# Patient Record
Sex: Female | Born: 1950
Health system: Southern US, Community
[De-identification: ages and names within clinical notes are randomized; demographics above are authoritative.]

## PROBLEM LIST (undated history)

## (undated) DIAGNOSIS — K219 Gastro-esophageal reflux disease without esophagitis: Secondary | ICD-10-CM

## (undated) DIAGNOSIS — N951 Menopausal and female climacteric states: Secondary | ICD-10-CM

## (undated) DIAGNOSIS — E039 Hypothyroidism, unspecified: Secondary | ICD-10-CM

## (undated) DIAGNOSIS — R011 Cardiac murmur, unspecified: Secondary | ICD-10-CM

## (undated) DIAGNOSIS — I341 Nonrheumatic mitral (valve) prolapse: Secondary | ICD-10-CM

## (undated) DIAGNOSIS — K589 Irritable bowel syndrome without diarrhea: Secondary | ICD-10-CM

## (undated) DIAGNOSIS — M1990 Unspecified osteoarthritis, unspecified site: Secondary | ICD-10-CM

## (undated) DIAGNOSIS — E669 Obesity, unspecified: Secondary | ICD-10-CM

## (undated) DIAGNOSIS — E785 Hyperlipidemia, unspecified: Secondary | ICD-10-CM

## (undated) DIAGNOSIS — I2510 Atherosclerotic heart disease of native coronary artery without angina pectoris: Secondary | ICD-10-CM

## (undated) DIAGNOSIS — I10 Essential (primary) hypertension: Secondary | ICD-10-CM

## (undated) HISTORY — PX: COSMETIC SURGERY: SHX468

## (undated) HISTORY — DX: Irritable bowel syndrome, unspecified: K58.9

## (undated) HISTORY — DX: Obesity, unspecified: E66.9

## (undated) HISTORY — PX: BREAST EXCISIONAL BIOPSY: SUR124

## (undated) HISTORY — DX: Nonrheumatic mitral (valve) prolapse: I34.1

## (undated) HISTORY — PX: BREAST SURGERY: SHX581

## (undated) HISTORY — DX: Cardiac murmur, unspecified: R01.1

## (undated) HISTORY — DX: Gastro-esophageal reflux disease without esophagitis: K21.9

## (undated) HISTORY — DX: Essential (primary) hypertension: I10

## (undated) HISTORY — DX: Hypothyroidism, unspecified: E03.9

## (undated) HISTORY — PX: EYE SURGERY: SHX253

## (undated) HISTORY — DX: Menopausal and female climacteric states: N95.1

## (undated) HISTORY — PX: OTHER SURGICAL HISTORY: SHX169

## (undated) HISTORY — DX: Hyperlipidemia, unspecified: E78.5

## (undated) HISTORY — DX: Unspecified osteoarthritis, unspecified site: M19.90

---

## 1998-06-16 LAB — FECAL OCCULT BLOOD, GUAIAC: Fecal Occult Blood: NEGATIVE

## 2001-01-15 LAB — HM DEXA SCAN: HM Dexa Scan: NORMAL

## 2003-08-16 LAB — CONVERTED CEMR LAB: Pap Smear: NORMAL

## 2003-10-26 ENCOUNTER — Ambulatory Visit: Payer: Self-pay | Admitting: Gastroenterology

## 2003-10-26 LAB — HM COLONOSCOPY: HM Colonoscopy: NORMAL

## 2004-11-20 ENCOUNTER — Ambulatory Visit: Payer: Self-pay | Admitting: Family Medicine

## 2006-02-04 ENCOUNTER — Ambulatory Visit: Payer: Self-pay | Admitting: Family Medicine

## 2007-11-18 ENCOUNTER — Encounter: Payer: Self-pay | Admitting: Family Medicine

## 2009-01-31 ENCOUNTER — Encounter: Payer: Self-pay | Admitting: Family Medicine

## 2009-05-16 ENCOUNTER — Ambulatory Visit: Payer: Self-pay | Admitting: Family Medicine

## 2009-05-16 ENCOUNTER — Other Ambulatory Visit: Admission: RE | Admit: 2009-05-16 | Discharge: 2009-05-16 | Payer: Self-pay | Admitting: Family Medicine

## 2009-05-16 DIAGNOSIS — E039 Hypothyroidism, unspecified: Secondary | ICD-10-CM | POA: Insufficient documentation

## 2009-05-16 DIAGNOSIS — E785 Hyperlipidemia, unspecified: Secondary | ICD-10-CM | POA: Insufficient documentation

## 2009-05-16 DIAGNOSIS — N8112 Cystocele, lateral: Secondary | ICD-10-CM | POA: Insufficient documentation

## 2009-05-16 LAB — HM PAP SMEAR

## 2009-05-23 ENCOUNTER — Encounter: Admission: RE | Admit: 2009-05-23 | Discharge: 2009-05-23 | Payer: Self-pay | Admitting: Family Medicine

## 2009-05-23 LAB — HM MAMMOGRAPHY: HM Mammogram: NEGATIVE

## 2009-05-25 ENCOUNTER — Encounter: Payer: Self-pay | Admitting: Family Medicine

## 2009-05-25 ENCOUNTER — Encounter (INDEPENDENT_AMBULATORY_CARE_PROVIDER_SITE_OTHER): Payer: Self-pay | Admitting: *Deleted

## 2009-05-25 LAB — CONVERTED CEMR LAB: Pap Smear: NEGATIVE

## 2009-11-21 ENCOUNTER — Ambulatory Visit: Payer: Self-pay | Admitting: Family Medicine

## 2009-11-22 LAB — CONVERTED CEMR LAB
ALT: 17 units/L (ref 0–35)
AST: 18 units/L (ref 0–37)
Cholesterol: 169 mg/dL (ref 0–200)
HDL: 48.5 mg/dL (ref >39.00)
LDL Cholesterol: 95 mg/dL (ref 0–99)
TSH: 3.07 microintl units/mL (ref 0.35–5.50)
Total CHOL/HDL Ratio: 3
Triglycerides: 126 mg/dL (ref 0.0–149.0)
VLDL: 25.2 mg/dL (ref 0.0–40.0)

## 2010-02-14 NOTE — Letter (Signed)
Summary: Results Follow up Letter  [place] [facility]  [address] [place], [place] [address]   Phone: [phone]  Fax: [phone]    05/25/2009 MRN: [id number]    Robin Gilmore [address] [place], [place]  [address]    Dear Ms. [facility],  The following are the results of your recent test(s):  Test         Result    Pap Smear:        Normal _____  Not Normal _____ Comments: ______________________________________________________ Cholesterol: LDL(Bad cholesterol):         Your goal is less than:         HDL (Good cholesterol):       Your goal is more than: Comments:  ______________________________________________________ Mammogram:        Normal ___X__  Not Normal _____ Comments:  Yearly follow up is recommended.   ___________________________________________________________________ Hemoccult:        Normal _____  Not normal _______ Comments:    _____________________________________________________________________ Other Tests:    We routinely do not discuss normal results over the telephone.  If you desire a copy of the results, or you have any questions about this information we can discuss them at your next office visit.   Sincerely,    Marne A. Milinda Antis, M.D.  MAT:lsf

## 2010-02-14 NOTE — Letter (Signed)
Summary: Dr.Charles Phillilps Records  Dr.Charles Phillilps Records   Imported By: Beau Fanny 05/19/2009 09:36:21  _____________________________________________________________________  External Attachment:    Type:   Image     Comment:   External Document

## 2010-02-14 NOTE — Assessment & Plan Note (Signed)
Summary: establish from billie/pap/bladder/dlo   Vital Signs:  Patient profile:   59 year old female Height:      67 inches Weight:      200.50 pounds BMI:     31.52 Temp:     97.6 degrees F oral Pulse rate:   76 / minute Pulse rhythm:   regular BP sitting:   140 / 78  (left arm) Cuff size:   large  Vitals Entered By: Lewanda Rife LPN (May 16, 996 8:25 AM) CC: Re establish as pt. (Billie Bean's pt)   History of Present Illness: last appt was 1/08 here to est as new pt again   used to see Willaim Sheng  Dr Vear Clock - was taking care of thyroid   hypothyroid -- last test 3 mo ago  does not feel any difference- dose has been stable for a while  other labs - cholesterol -- total 200 and HDL was good   last mam 05/last dexa 05 nl  has not been able to afford it  does take ca and vit d   is up to date Td  did get flu shot 2 years  never had pneumovax  never had shingles vaccine - interested in the future in one when she can afford it   is having some bladder problems  a little pressure last week  area felt swollen - thinks she has prolapse -- comes and goes no pain or interference in urination   has never had abn pap   is obese- working on wt recent loss 9 lb with diet and exercise  Allergies (verified): 1)  ! Codeine  Past History:  Family History: Last updated: 2009-06-05 father died 80 with lung cancer, smoker and alcohol abuse mother died at 36- -- ? 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 Eyes:  Denies blurring and eye irritation. CV:  Denies chest pain or discomfort, palpitations, shortness of breath with exertion, and swelling of feet. Resp:  Denies cough, shortness of breath, and wheezing. GI:  Denies abdominal pain, change in bowel habits, indigestion, and nausea. GU:  Denies abnormal vaginal bleeding, discharge, dysuria, incontinence, nocturia, urinary frequency, and urinary hesitancy. MS:  Denies joint pain and cramps. Derm:  Denies itching, lesion(s), poor wound healing, and rash. Neuro:  Denies numbness and tingling. Psych:  Denies anxiety and depression. Endo:  Denies cold intolerance, excessive thirst, excessive urination, and heat intolerance. Heme:  Denies abnormal bruising and bleeding.  Physical Exam  General:  overweight but generally well appearing  Head:  normocephalic, atraumatic, and no abnormalities observed.   Eyes:  vision grossly intact, pupils equal, pupils round, and pupils reactive to light.  no conjunctival pallor, injection or icterus  Ears:  R ear normal and L ear normal.   Nose:  no nasal discharge.   Mouth:  pharynx pink and moist.   Neck:  supple with full rom and no masses or thyromegally, no JVD or carotid bruit  Chest Wall:  No deformities,  masses, or tenderness noted. Breasts:  No mass, nodules, thickening, tenderness, bulging, retraction, inflamation, nipple discharge or skin changes noted.   Lungs:  Normal respiratory effort, chest expands symmetrically. Lungs are clear to auscultation, no crackles or wheezes. Heart:  Normal rate and regular rhythm. S1 and S2 normal without gallop, murmur, click, rub or other extra sounds. Abdomen:  Bowel sounds positive,abdomen soft and non-tender without masses, organomegaly or hernias noted. no renal bruits no suprapubic tenderness or fullness felt  Genitalia:  mild cystocele noted with valsalva no M noted normal introitus, no external lesions, no vaginal discharge, mucosa pink and moist, no vaginal or cervical lesions, no vaginal atrophy, no friaility or hemorrhage, normal uterus size and position, and no adnexal masses or tenderness.   Msk:  No deformity or scoliosis noted of thoracic or lumbar spine.  no acute joint changes no kyphosis  Pulses:  R and L carotid,radial,femoral,dorsalis pedis and posterior tibial pulses are full and equal bilaterally Extremities:  No clubbing, cyanosis, edema, or deformity noted with normal full range of motion of all joints.   Neurologic:  sensation intact to light touch, gait normal, and DTRs symmetrical and normal.   Skin:  Intact without suspicious lesions or rashes Cervical Nodes:  No lymphadenopathy noted Axillary Nodes:  No palpable lymphadenopathy Inguinal Nodes:  No significant adenopathy Psych:  normal affect, talkative and pleasant    Impression & Recommendations:  Problem # 1:  CYSTOCELE WITHOUT MENTION UTERINE PROLAPSE LAT (ICD-618.02) Assessment New no discomfort or urinary symptoms from this at all enc to continue kegels  update me if this worsens or if urinary symptoms  exam done today  enc wt loss   Problem # 2:  ROUTINE GYNECOLOGICAL EXAMINATION (ICD-V72.31) Assessment: Comment Only annual exam with pap done  pend cystocele  noted- small  Problem # 3:  OTHER SCREENING MAMMOGRAM (ICD-V76.12) Assessment: New annual mammogram scheduled adv pt to continue regular self breast exams non remarkable breast exam today  fibrocystic hx noted  Orders: Radiology Referral (Radiology)  Problem # 4:  HYPOTHYROIDISM (ICD-244.9) Assessment: New  per pt stable clinically with nl tsh 3 mo ago will send for lab from last doc f/u in 6 mo  Her updated medication list for this problem includes:    Levothroid 125 Mcg Tabs (Levothyroxine sodium) .Marland Kitchen... Take 1 tablet by mouth once a day  Orders: Prescription Created Electronically [phone])  Problem # 5:  HYPERLIPIDEMIA (ICD-272.4) Assessment: New pt states with high LDL disc low sat fat diet in detail sent for labs from prev doc f/u 6 mo   Complete Medication List: 1)  Levothroid 125 Mcg Tabs (Levothyroxine sodium) .... Take 1 tablet by mouth once a day 2)  Calcium Carbonate-vitamin D 600-400 Mg-unit Tabs (Calcium carbonate-vitamin d) .... Take one tablet twice  a day 3)  Flax Oil (Flaxseed (linseed)) .... Take 1 tablet by mouth once a day  Patient Instructions: 1)  please send to Dr Vear Clock -- for last labs , and last note, last EKG 2)  we will schedule mammogram at check out  3)  pelvic exam done today  4)  follow up in about 6 months  5)  re- assuring exam today  6)  please update me if pelvic discomfort or incontinence  7)  work on weight loss with healthy diet and exercise  Prescriptions: LEVOTHROID 125 MCG TABS (LEVOTHYROXINE SODIUM) Take 1 tablet by mouth once a day  #90 x 3   Entered and Authorized by:   Judith Part MD   Signed by:   Judith Part MD on 05/16/2009   Method used:   Electronically to        [facility] [address] (retail)       [address]       [place], [place]  [address]       Ph: [phone]       Fax: [phone]   RxID:   [id number]   Current Allergies (reviewed today): ! CODEINE   Preventive Care  Screening  Bone Density:    Date:  01/15/2001    Results:  normal std dev  Colonoscopy:    Date:  10/26/2003    Results:  normal   Mammogram:    Date:  08/16/1999    Results:  normal   Pap Smear:    Date:  08/16/2003    Results:  normal   Last Tetanus Booster:    Date:  11/15/2001    Results:  Td   Hemoccult:    Date:  06/16/1998    Results:  negative

## 2010-02-14 NOTE — Letter (Signed)
Summary: Results Follow up Letter  [place] [facility]  [address] [place], [place] [address]   Phone: [phone]  Fax: [phone]    05/25/2009 MRN: [id number]    MAKHAYLA MCMURRY [address] [place], [place]  [address]    Dear Ms. [facility],  The following are the results of your recent test(s):  Test         Result    Pap Smear:        Normal ___X__  Not Normal _____ Comments: ______________________________________________________ Cholesterol: LDL(Bad cholesterol):         Your goal is less than:         HDL (Good cholesterol):       Your goal is more than: Comments:  ______________________________________________________ Mammogram:        Normal _____  Not Normal _____ Comments:  ___________________________________________________________________ Hemoccult:        Normal _____  Not normal _______ Comments:    _____________________________________________________________________ Other Tests:    We routinely do not discuss normal results over the telephone.  If you desire a copy of the results, or you have any questions about this information we can discuss them at your next office visit.   Sincerely,    Marne Tower,MD  MT/ri   Appended Document: Results Follow up Letter We got letter back and pt said that [address] is where she lives but she also has [address] [place] [address]. I made correction in IDX and EMR. Pt did not want remailed gave report verbally.

## 2010-02-14 NOTE — Assessment & Plan Note (Signed)
Summary: FOLLOW UP / LFW   Vital Signs:  Patient profile:   60 year old female Height:      67 inches Weight:      195.75 pounds BMI:     30.77 Temp:     97.7 degrees F oral Pulse rate:   68 / minute Pulse rhythm:   regular BP sitting:   132 / 70  (right arm) Cuff size:   large  Vitals Entered By: Lewanda Rife LPN (November 21, 2009 8:06 AM) CC: six month f/u   History of Present Illness: here for f/u of hypothyroidism and also lipids  got flu shot today   wt is down 5 lb has been working on it -- did better and then gained some back  son came back from being deployed - cooking and eating too much  bp is good 132/70  thyroid-- feels fine - no changes    lipids- no meds diet --is very healthy -- expects it to be good  family history of high chol  tries to walk 5 days per week    Allergies: 1)  ! Codeine  Past History:  Past Medical History: Last updated: 2009-05-21 hypothyroid  mitral regurge and prolapse(used to have prophylaxis) IBS/ constipation menopausal syndrome obesity hyperlipidemia    gen surg- Dr Lemar Livings  Past Surgical History: Last updated: 2009-05-21 CS retrovaginal fistula repair breast bx times 2 benign  nl colonosc 05 echo 2001 EF 55-60% 03 nl dexa  Family History: Last updated: 21-May-2009 father died 84 with lung cancer, smoker and alcohol abuse mother died at 55- -- ? 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 Eyes:  Denies blurring and eye pain. CV:  Denies chest pain or discomfort, lightheadness, palpitations, and shortness of breath  with exertion. Resp:  Denies cough and shortness of breath. GI:  Denies abdominal pain, change in bowel habits, indigestion, and nausea. GU:  Denies urinary frequency. MS:  Denies muscle aches, cramps, and muscle weakness. Derm:  Denies itching, lesion(s), poor wound healing, and rash. Neuro:  Denies numbness and tingling. Psych:  Denies anxiety and depression. Endo:  Denies cold intolerance, excessive thirst, excessive urination, and heat intolerance.  Physical Exam  General:  overweight but generally well appearing  Head:  normocephalic, atraumatic, and no abnormalities observed.   Eyes:  vision grossly intact, pupils equal, pupils round, and pupils reactive to light.  no conjunctival pallor, injection or icterus  Mouth:  pharynx pink and moist.   Neck:  supple with full rom and no masses or thyromegally, no JVD or carotid bruit  Lungs:  Normal respiratory effort, chest expands symmetrically. Lungs are clear to auscultation, no crackles or wheezes. Heart:  Normal rate and regular rhythm. S1 and S2 normal without gallop, murmur, click, rub or other extra sounds. Abdomen:  soft, non-tender, and normal bowel sounds.   Pulses:  R and L carotid,radial,femoral,dorsalis pedis and posterior tibial pulses are full and equal bilaterally Extremities:  No clubbing, cyanosis, edema, or deformity noted with normal full range of motion of all joints.  Neurologic:  gait normal and DTRs symmetrical and normal no tremor.   Skin:  Intact without suspicious lesions or rashes no hair loss Cervical Nodes:  No lymphadenopathy noted Psych:  normal affect, talkative and pleasant    Impression & Recommendations:  Problem # 1:  HYPERLIPIDEMIA (ICD-272.4) Assessment Unchanged per pt diet is generally healthy- though with some wt gain after loss enc good exercise disc low sat fat diet  lab today  physical in 6 mo  Orders: Venipuncture ([address]) TLB-Lipid Panel (80061-LIPID) TLB-TSH (Thyroid Stimulating  Hormone) (84443-TSH) TLB-ALT (SGPT) (84460-ALT) TLB-AST (SGOT) (84450-SGOT)  Problem # 2:  HYPOTHYROIDISM (ICD-244.9) Assessment: Unchanged no clinical changes check tsh and update today  no change in med to date Her updated medication list for this problem includes:    Levothroid 125 Mcg Tabs (Levothyroxine sodium) .Marland Kitchen... Take 1 tablet by mouth once a day  Orders: Venipuncture ([address]) TLB-Lipid Panel (80061-LIPID) TLB-TSH (Thyroid Stimulating Hormone) (84443-TSH) TLB-ALT (SGPT) (84460-ALT) TLB-AST (SGOT) (84450-SGOT)  Complete Medication List: 1)  Levothroid 125 Mcg Tabs (Levothyroxine sodium) .... Take 1 tablet by mouth once a day 2)  Calcium Carbonate-vitamin D 600-400 Mg-unit Tabs (Calcium carbonate-vitamin d) .... Take one tablet twice a day 3)  Fish Oil Oil (Fish oil) .... Take 1 capsule by mouth once a day  Other Orders: Admin 1st Vaccine (09811) Flu Vaccine 57yrs + (91478)  Patient Instructions: 1)  keep up the good exercise 2)  get back to a low fat diet  3)  labs today  4)  no change in medicine  5)  schedule fasting labs and then physical in 6 months wellness/ lipid v70.0   Orders Added: 1)  Venipuncture [36415] 2)  TLB-Lipid Panel [80061-LIPID] 3)  TLB-TSH (Thyroid Stimulating Hormone) [84443-TSH] 4)  TLB-ALT (SGPT) [84460-ALT] 5)  TLB-AST (SGOT) [84450-SGOT] 6)  Admin 1st Vaccine [90471] 7)  Flu Vaccine 83yrs + [90658] 8)  Est. Patient Level III [29562]    Current Allergies (reviewed today): ! CODEINE   Flu Vaccine Consent Questions     Do you have a history of severe allergic reactions to this vaccine? no    Any prior history of allergic reactions to egg and/or gelatin? no    Do you have a sensitivity to the preservative Thimersol? no    Do you have a past history of Guillan-Barre Syndrome? no    Do you currently have an acute febrile illness? no    Have you ever had a severe reaction to latex? no    Vaccine information given and explained to  patient? yes    Are you currently pregnant? no    Lot Number:[id number]   Exp Date:07/15/2010   Site Given  Left Deltoid [email or web address] Lewanda Rife LPN  November 21, 2009 8:55 AM

## 2010-05-19 ENCOUNTER — Other Ambulatory Visit: Payer: Self-pay | Admitting: Family Medicine

## 2010-05-19 DIAGNOSIS — E039 Hypothyroidism, unspecified: Secondary | ICD-10-CM

## 2010-05-19 DIAGNOSIS — E785 Hyperlipidemia, unspecified: Secondary | ICD-10-CM

## 2010-05-22 ENCOUNTER — Other Ambulatory Visit: Payer: Self-pay

## 2010-05-29 ENCOUNTER — Encounter: Payer: Self-pay | Admitting: Family Medicine

## 2010-09-05 ENCOUNTER — Other Ambulatory Visit: Payer: Self-pay | Admitting: Family Medicine

## 2010-09-05 NOTE — Telephone Encounter (Signed)
[facility] electronically request refill Levothyroxine . #90 x 0 refill given. CPX appt cancelled 05/29/10. Note attached pt needs to call for appt.

## 2010-10-19 ENCOUNTER — Other Ambulatory Visit: Payer: Self-pay | Admitting: Family Medicine

## 2010-10-19 DIAGNOSIS — Z1231 Encounter for screening mammogram for malignant neoplasm of breast: Secondary | ICD-10-CM

## 2010-11-01 ENCOUNTER — Ambulatory Visit
Admission: RE | Admit: 2010-11-01 | Discharge: 2010-11-01 | Disposition: A | Discharge status: Home / Self Care | Payer: No Typology Code available for payment source | Source: Ambulatory Visit | Attending: Family Medicine | Admitting: Family Medicine

## 2010-11-01 DIAGNOSIS — Z1231 Encounter for screening mammogram for malignant neoplasm of breast: Secondary | ICD-10-CM

## 2010-11-07 ENCOUNTER — Encounter: Payer: Self-pay | Admitting: *Deleted

## 2010-12-04 ENCOUNTER — Other Ambulatory Visit (INDEPENDENT_AMBULATORY_CARE_PROVIDER_SITE_OTHER): Payer: No Typology Code available for payment source

## 2010-12-04 DIAGNOSIS — E039 Hypothyroidism, unspecified: Secondary | ICD-10-CM

## 2010-12-04 DIAGNOSIS — E785 Hyperlipidemia, unspecified: Secondary | ICD-10-CM

## 2010-12-04 LAB — CBC WITH DIFFERENTIAL/PLATELET
Basophils Absolute: 0 10*3/uL (ref 0.0–0.1)
Basophils Relative: 0.6 % (ref 0.0–3.0)
Eosinophils Absolute: 0.2 10*3/uL (ref 0.0–0.7)
Eosinophils Relative: 2.9 % (ref 0.0–5.0)
HCT: 39.9 % (ref 36.0–46.0)
Hemoglobin: 13.4 g/dL (ref 12.0–15.0)
Lymphocytes Relative: 21.8 % (ref 12.0–46.0)
Lymphs Abs: 1.3 10*3/uL (ref 0.7–4.0)
MCHC: 33.6 g/dL (ref 30.0–36.0)
MCV: 85.5 fl (ref 78.0–100.0)
Monocytes Absolute: 0.5 10*3/uL (ref 0.1–1.0)
Monocytes Relative: 8.1 % (ref 3.0–12.0)
Neutro Abs: 4.1 10*3/uL (ref 1.4–7.7)
Neutrophils Relative %: 66.6 % (ref 43.0–77.0)
Platelets: 287 10*3/uL (ref 150.0–400.0)
RBC: 4.67 Mil/uL (ref 3.87–5.11)
RDW: 14.4 % (ref 11.5–14.6)
WBC: 6.2 10*3/uL (ref 4.5–10.5)

## 2010-12-04 LAB — COMPREHENSIVE METABOLIC PANEL
ALT: 19 U/L (ref 0–35)
AST: 20 U/L (ref 0–37)
Albumin: 4.1 g/dL (ref 3.5–5.2)
Alkaline Phosphatase: 55 U/L (ref 39–117)
BUN: 16 mg/dL (ref 6–23)
CO2: 26 mEq/L (ref 19–32)
Calcium: 9.1 mg/dL (ref 8.4–10.5)
Chloride: 104 mEq/L (ref 96–112)
Creatinine, Ser: 0.7 mg/dL (ref 0.4–1.2)
GFR: 93.23 mL/min (ref >60.00)
Glucose, Bld: 92 mg/dL (ref 70–99)
Potassium: 3.8 mEq/L (ref 3.5–5.1)
Sodium: 140 mEq/L (ref 135–145)
Total Bilirubin: 0.6 mg/dL (ref 0.3–1.2)
Total Protein: 7.3 g/dL (ref 6.0–8.3)

## 2010-12-04 LAB — LIPID PANEL
Cholesterol: 195 mg/dL (ref 0–200)
HDL: 49.9 mg/dL (ref >39.00)
LDL Cholesterol: 113 mg/dL — ABNORMAL HIGH (ref 0–99)
Total CHOL/HDL Ratio: 4
Triglycerides: 162 mg/dL — ABNORMAL HIGH (ref 0.0–149.0)
VLDL: 32.4 mg/dL (ref 0.0–40.0)

## 2010-12-04 LAB — TSH: TSH: 4.95 u[IU]/mL (ref 0.35–5.50)

## 2010-12-11 ENCOUNTER — Encounter: Payer: Self-pay | Admitting: Family Medicine

## 2010-12-19 ENCOUNTER — Encounter: Payer: Self-pay | Admitting: Family Medicine

## 2010-12-25 ENCOUNTER — Encounter: Payer: Self-pay | Admitting: Family Medicine

## 2011-01-29 ENCOUNTER — Ambulatory Visit (INDEPENDENT_AMBULATORY_CARE_PROVIDER_SITE_OTHER): Payer: No Typology Code available for payment source | Admitting: Family Medicine

## 2011-01-29 ENCOUNTER — Encounter: Payer: Self-pay | Admitting: Family Medicine

## 2011-01-29 VITALS — BP 130/80 | HR 68 | Temp 97.9°F | Ht 67.0 in | Wt 197.8 lb

## 2011-01-29 DIAGNOSIS — Z0000 Encounter for general adult medical examination without abnormal findings: Secondary | ICD-10-CM

## 2011-01-29 DIAGNOSIS — Z23 Encounter for immunization: Secondary | ICD-10-CM

## 2011-01-29 DIAGNOSIS — Z8371 Family history of colonic polyps: Secondary | ICD-10-CM

## 2011-01-29 DIAGNOSIS — Z0001 Encounter for general adult medical examination with abnormal findings: Secondary | ICD-10-CM | POA: Insufficient documentation

## 2011-01-29 DIAGNOSIS — E039 Hypothyroidism, unspecified: Secondary | ICD-10-CM

## 2011-01-29 DIAGNOSIS — E785 Hyperlipidemia, unspecified: Secondary | ICD-10-CM

## 2011-01-29 MED ORDER — LEVOTHYROXINE SODIUM 125 MCG PO TABS: 125.0000 ug | ORAL_TABLET | Freq: Every day | ORAL | Status: DC

## 2011-01-29 NOTE — Progress Notes (Signed)
Subjective:    Patient ID: Robin Gilmore, female    DOB: 1950-12-10, 61 y.o.   MRN: [id number]  HPI Here for health maintenance exam and to review chronic medical problems   Is feeling ok in general  Had the flu 2 weeks ago  Never got a flu shot    bp is 130/80 today good  Wt is up 2 lb with bmi of 30 Not really exercising because working - on feet all day   Lab Results  Component Value Date   CHOL 195 12/04/2010   CHOL 169 11/21/2009   Lab Results  Component Value Date   HDL 49.90 12/04/2010   HDL 48.50 11/21/2009   Lab Results  Component Value Date   LDLCALC 113* 12/04/2010   LDLCALC 95 11/21/2009   Lab Results  Component Value Date   TRIG 162.0* 12/04/2010   TRIG 126.0 11/21/2009   Lab Results  Component Value Date   CHOLHDL 4 12/04/2010   CHOLHDL 3 11/21/2009   No results found for this basename: LDLDIRECT   overall not bad  Hypothyroid Lab Results  Component Value Date   TSH 4.95 12/04/2010   no clinical change No hair or skin change   Pap 5/11- normal  Does not see a gyn  No abn paps  No symptoms  No new partners Will skip exam this year   colonosc 10/05-- said 10 year follow up  Sister has colitis and brother with polyps (pre cancerous)  Wants to go ahead and get her next colonosc    Zoster status- is interested  Flu shot-- missed this year -had the flu  Td 11/03-- wants to update that  (keeps grandchild and wants the pertussis protection)  dexa nl in 03  Mam 10/12 nl  Self exam no lumps or changes   Patient Active Problem List  Diagnoses  . HYPOTHYROIDISM  . HYPERLIPIDEMIA  . CYSTOCELE WITHOUT MENTION UTERINE PROLAPSE LAT  . Routine general medical examination at a health care facility  . Family history of colonic polyps   Past Medical History  Diagnosis Date  . Hypothyroid   . Mitral prolapse and regurge used to have prophylaxis  . IBS (irritable bowel syndrome)   . Constipation   . Menopausal syndrome   . Obesity   .  Hyperlipidemia    Past Surgical History  Procedure Date  . Cesarean section   . Retrovaginal fistula repair   . Breast surgery     breast biopsy x 2 benign   History  Substance Use Topics  . Smoking status: Former Games developer  . Smokeless tobacco: Not on file  . Alcohol Use: No   Family History  Problem Relation Age of Onset  . Alcohol abuse Mother   . Diabetes Mother   . Heart disease Mother     ? MI CAD  . Cancer Father     lung CA  . Alcohol abuse Father   . Hypertension Sister   . Hyperlipidemia Sister   . Alcohol abuse Brother   . Colon polyps Brother    Allergies  Allergen Reactions  . Codeine     REACTION: Severe nausea and vomitnig   No current outpatient prescriptions on file prior to visit.        Review of Systems Review of Systems  Constitutional: Negative for fever, appetite change, fatigue and unexpected weight change.  Eyes: Negative for pain and visual disturbance.  Respiratory: Negative for cough and shortness of breath.   Cardiovascular:  Negative for cp or palpitations    Gastrointestinal: Negative for nausea, diarrhea and constipation.  Genitourinary: Negative for urgency and frequency.  Skin: Negative for pallor or rash   Neurological: Negative for weakness, light-headedness, numbness and headaches.  Hematological: Negative for adenopathy. Does not bruise/bleed easily.  Psychiatric/Behavioral: Negative for dysphoric mood. The patient is not nervous/anxious.          Objective:   Physical Exam  Constitutional: She appears well-developed and well-nourished. No distress.       overwt and well appearing   HENT:  Head: Normocephalic and atraumatic.  Right Ear: External ear normal.  Left Ear: External ear normal.  Nose: Nose normal.  Mouth/Throat: Oropharynx is clear and moist.  Eyes: Conjunctivae and EOM are normal. Pupils are equal, round, and reactive to light. No scleral icterus.  Neck: Normal range of motion. Neck supple. No JVD  present. Carotid bruit is not present. Erythema present. No thyromegaly present.  Cardiovascular: Normal rate, regular rhythm, normal heart sounds and intact distal pulses.  Exam reveals no gallop.   Pulmonary/Chest: Effort normal and breath sounds normal. No respiratory distress. She has no wheezes.  Abdominal: Soft. Bowel sounds are normal. She exhibits no distension, no abdominal bruit and no mass. There is no tenderness.  Genitourinary: No breast swelling, tenderness, discharge or bleeding.       Breast exam: No mass, nodules, thickening, tenderness, bulging, retraction, inflamation, nipple discharge or skin changes noted.  No axillary or clavicular LA.  Chaperoned exam.    Musculoskeletal: Normal range of motion. She exhibits no edema and no tenderness.  Lymphadenopathy:    She has no cervical adenopathy.  Neurological: She is alert. She has normal reflexes. No cranial nerve deficit. She exhibits normal muscle tone. Coordination normal.  Skin: Skin is warm and dry. No rash noted. No erythema. No pallor.  Psychiatric: She has a normal mood and affect.          Assessment & Plan:

## 2011-01-29 NOTE — Assessment & Plan Note (Signed)
Reviewed health habits including diet and exercise and skin cancer prevention Also reviewed health mt list, fam hx and immunizations  Ref wellness lab in detail Needs exercise program

## 2011-01-29 NOTE — Assessment & Plan Note (Signed)
Ref to GI to disc when to get colonosc  Last 05 normal  Sister has colitis as well No symptoms for pt

## 2011-01-29 NOTE — Assessment & Plan Note (Signed)
Disc goals for lipids and reasons to control them Rev labs with pt Rev low sat fat diet in detail   

## 2011-01-29 NOTE — Patient Instructions (Signed)
We will do GI referral at check out  If you are interested in a shingles/zoster vaccine - call your insurance to check on coverage,( you should not get it within 1 month of other vaccines) , then call us for a prescription  for it to take to a pharmacy that gives the shot or schedule it here Tdap and flu shots today  Avoid red meat/ fried foods/ egg yolks/ fatty breakfast meats/ butter, cheese and high fat dairy/ and shellfish   Keep working on healthy diet and exercise

## 2011-01-29 NOTE — Assessment & Plan Note (Signed)
tsh is theraputic and no symptoms No change in dose refilled

## 2011-01-31 ENCOUNTER — Telehealth: Payer: Self-pay

## 2011-01-31 NOTE — Telephone Encounter (Signed)
Pt has dental appt for cleaning in 2 weeks and dentist office advised pt to call PCP to see if needs antibiotic preventive treatment for dental appt. Pt has mitral valve regurgitation. Please call pt at [phone] and pt uses [facility]  [address] if pharmacy needed.

## 2011-01-31 NOTE — Telephone Encounter (Signed)
Patient notified as instructed by telephone v/m per pt request.

## 2011-01-31 NOTE — Telephone Encounter (Signed)
Pt called back and said if no answer at [phone] can leave detailed answer on cell [phone].

## 2011-01-31 NOTE — Telephone Encounter (Signed)
No , not unless she has had valve surgery Thanks

## 2011-04-02 ENCOUNTER — Ambulatory Visit: Payer: Self-pay | Admitting: Gastroenterology

## 2012-03-10 ENCOUNTER — Other Ambulatory Visit: Payer: Self-pay | Admitting: Family Medicine

## 2012-03-10 NOTE — Telephone Encounter (Signed)
Ok to refill? No recent appt and no future appt 

## 2012-03-10 NOTE — Telephone Encounter (Signed)
Please schedule a PE (summer is ok) and refil until then,thanks

## 2012-03-11 ENCOUNTER — Other Ambulatory Visit: Payer: Self-pay | Admitting: *Deleted

## 2012-03-12 NOTE — Telephone Encounter (Signed)
CPE with labs prior scheduled and med refilled until then

## 2012-06-22 ENCOUNTER — Telehealth: Payer: Self-pay | Admitting: Family Medicine

## 2012-06-22 DIAGNOSIS — Z0000 Encounter for general adult medical examination without abnormal findings: Secondary | ICD-10-CM

## 2012-06-22 DIAGNOSIS — E785 Hyperlipidemia, unspecified: Secondary | ICD-10-CM

## 2012-06-22 NOTE — Telephone Encounter (Signed)
Message copied by Judy Pimple on Sun Jun 22, 2012  7:05 PM ------      Message from: Alvina Chou      Created: Fri Jun 13, 2012 12:15 PM      Regarding: Lab orders for Monday, 6.9.14       Patient is scheduled for CPX labs, please order future labs, Thanks , Terri       ------

## 2012-06-23 ENCOUNTER — Other Ambulatory Visit (INDEPENDENT_AMBULATORY_CARE_PROVIDER_SITE_OTHER): Payer: BC Managed Care – PPO

## 2012-06-23 DIAGNOSIS — Z0000 Encounter for general adult medical examination without abnormal findings: Secondary | ICD-10-CM

## 2012-06-23 DIAGNOSIS — E785 Hyperlipidemia, unspecified: Secondary | ICD-10-CM

## 2012-06-23 DIAGNOSIS — E039 Hypothyroidism, unspecified: Secondary | ICD-10-CM

## 2012-06-23 LAB — COMPREHENSIVE METABOLIC PANEL
ALT: 18 U/L (ref 0–35)
AST: 19 U/L (ref 0–37)
Albumin: 3.9 g/dL (ref 3.5–5.2)
Alkaline Phosphatase: 49 U/L (ref 39–117)
BUN: 21 mg/dL (ref 6–23)
CO2: 29 mEq/L (ref 19–32)
Calcium: 9.5 mg/dL (ref 8.4–10.5)
Chloride: 105 mEq/L (ref 96–112)
Creatinine, Ser: 0.6 mg/dL (ref 0.4–1.2)
GFR: 105.15 mL/min (ref >60.00)
Glucose, Bld: 96 mg/dL (ref 70–99)
Potassium: 4.2 mEq/L (ref 3.5–5.1)
Sodium: 141 mEq/L (ref 135–145)
Total Bilirubin: 0.6 mg/dL (ref 0.3–1.2)
Total Protein: 7.2 g/dL (ref 6.0–8.3)

## 2012-06-23 LAB — CBC WITH DIFFERENTIAL/PLATELET
Basophils Absolute: 0 10*3/uL (ref 0.0–0.1)
Basophils Relative: 0.4 % (ref 0.0–3.0)
Eosinophils Absolute: 0.2 10*3/uL (ref 0.0–0.7)
Eosinophils Relative: 2.5 % (ref 0.0–5.0)
HCT: 39.2 % (ref 36.0–46.0)
Hemoglobin: 13.2 g/dL (ref 12.0–15.0)
Lymphocytes Relative: 19.3 % (ref 12.0–46.0)
Lymphs Abs: 1.5 10*3/uL (ref 0.7–4.0)
MCHC: 33.6 g/dL (ref 30.0–36.0)
MCV: 86.1 fl (ref 78.0–100.0)
Monocytes Absolute: 0.7 10*3/uL (ref 0.1–1.0)
Monocytes Relative: 9 % (ref 3.0–12.0)
Neutro Abs: 5.5 10*3/uL (ref 1.4–7.7)
Neutrophils Relative %: 68.8 % (ref 43.0–77.0)
Platelets: 276 10*3/uL (ref 150.0–400.0)
RBC: 4.55 Mil/uL (ref 3.87–5.11)
RDW: 13.8 % (ref 11.5–14.6)
WBC: 7.9 10*3/uL (ref 4.5–10.5)

## 2012-06-23 LAB — LIPID PANEL
Cholesterol: 182 mg/dL (ref 0–200)
HDL: 39.6 mg/dL (ref >39.00)
Total CHOL/HDL Ratio: 5
Triglycerides: 208 mg/dL — ABNORMAL HIGH (ref 0.0–149.0)
VLDL: 41.6 mg/dL — ABNORMAL HIGH (ref 0.0–40.0)

## 2012-06-23 LAB — TSH: TSH: 4.76 u[IU]/mL (ref 0.35–5.50)

## 2012-06-23 LAB — LDL CHOLESTEROL, DIRECT: Direct LDL: 95.1 mg/dL

## 2012-06-30 ENCOUNTER — Other Ambulatory Visit (HOSPITAL_COMMUNITY)
Admission: RE | Admit: 2012-06-30 | Discharge: 2012-06-30 | Disposition: A | Discharge status: Home / Self Care | Payer: BC Managed Care – PPO | Source: Ambulatory Visit | Attending: Family Medicine | Admitting: Family Medicine

## 2012-06-30 ENCOUNTER — Ambulatory Visit (INDEPENDENT_AMBULATORY_CARE_PROVIDER_SITE_OTHER): Payer: BC Managed Care – PPO | Admitting: Family Medicine

## 2012-06-30 ENCOUNTER — Encounter: Payer: Self-pay | Admitting: Family Medicine

## 2012-06-30 VITALS — BP 126/86 | HR 78 | Temp 97.6°F | Ht 67.0 in | Wt 198.8 lb

## 2012-06-30 DIAGNOSIS — Z0000 Encounter for general adult medical examination without abnormal findings: Secondary | ICD-10-CM

## 2012-06-30 DIAGNOSIS — Z01419 Encounter for gynecological examination (general) (routine) without abnormal findings: Secondary | ICD-10-CM

## 2012-06-30 DIAGNOSIS — Z1151 Encounter for screening for human papillomavirus (HPV): Secondary | ICD-10-CM | POA: Insufficient documentation

## 2012-06-30 DIAGNOSIS — E785 Hyperlipidemia, unspecified: Secondary | ICD-10-CM

## 2012-06-30 DIAGNOSIS — E039 Hypothyroidism, unspecified: Secondary | ICD-10-CM

## 2012-06-30 MED ORDER — LEVOTHYROXINE SODIUM 125 MCG PO TABS: 125.0000 ug | ORAL_TABLET | Freq: Every day | ORAL | Status: DC

## 2012-06-30 NOTE — Assessment & Plan Note (Signed)
Gyn exam  Pap done - (3 y) Some bleeding with pap from cervix-pt has not had spotting however

## 2012-06-30 NOTE — Patient Instructions (Addendum)
If you are interested in a shingles/zoster vaccine - call your insurance to check on coverage,( you should not get it within 1 month of other vaccines) , then call us for a prescription  for it to take to a pharmacy that gives the shot , or make a nurse visit to get it here depending on your coverave   Avoid red meat/ fried foods/ egg yolks/ fatty breakfast meats/ butter, cheese and high fat dairy/ and shellfish  Take care of yourself and try to exercise at least 30 minutes 5 days per week

## 2012-06-30 NOTE — Progress Notes (Signed)
Subjective:    Patient ID: Robin Gilmore, female    DOB: [birth year], 62 y.o.   MRN: [id number]  HPI Here for health maintenance exam and to review chronic medical problems    Other than aches and pains - she feels ok  No time to take care of herself   Wt is up 1 lb with bmi of 31  Zoster status- has not had the vaccine -has new insurance  Would be interested in it if covered   mammo 10/12 Will make own appt  Self exam-no lumps or changes   Pap 5/11 normal Has not a hysterectomy  Time for 3 year pap today   Flu vaccine -did not get one this season - just forgot   Td 1/13   colonosc 3/13 nl   dexa 1/03 nl   Mood-good/ no depression   Hypothyroidism  Pt has no clinical changes No change in energy level/ hair or skin/ edema and no tremor Lab Results  Component Value Date   TSH 4.76 06/23/2012    Hx of lipids  Lab Results  Component Value Date   CHOL 182 06/23/2012   CHOL 195 12/04/2010   CHOL 169 11/21/2009   Lab Results  Component Value Date   HDL 39.60 06/23/2012   HDL 49.90 12/04/2010   HDL 48.50 11/21/2009   Lab Results  Component Value Date   LDLCALC 113* 12/04/2010   LDLCALC 95 11/21/2009   Lab Results  Component Value Date   TRIG 208.0* 06/23/2012   TRIG 162.0* 12/04/2010   TRIG 126.0 11/21/2009   Lab Results  Component Value Date   CHOLHDL 5 06/23/2012   CHOLHDL 4 12/04/2010   CHOLHDL 3 11/21/2009   Lab Results  Component Value Date   LDLDIRECT 95.1 06/23/2012   diet is fair  Not exercising as much - her HDL went down  She does keep a grandchild one day per week   Patient Active Problem List   Diagnosis Date Noted  . Routine general medical examination at a health care facility 01/29/2011  . Family history of colonic polyps 01/29/2011  . HYPOTHYROIDISM 05/16/2009  . HYPERLIPIDEMIA 05/16/2009  . CYSTOCELE WITHOUT MENTION UTERINE PROLAPSE LAT 05/16/2009   Past Medical History  Diagnosis Date  . Hypothyroid   . Mitral prolapse and regurge  used to have prophylaxis  . IBS (irritable bowel syndrome)   . Constipation   . Menopausal syndrome   . Obesity   . Hyperlipidemia    Past Surgical History  Procedure Laterality Date  . Cesarean section    . Retrovaginal fistula repair    . Breast surgery      breast biopsy x 2 benign   History  Substance Use Topics  . Smoking status: Former Games developer  . Smokeless tobacco: Not on file  . Alcohol Use: No   Family History  Problem Relation Age of Onset  . Alcohol abuse Mother   . Diabetes Mother   . Heart disease Mother     ? MI CAD  . Cancer Father     lung CA  . Alcohol abuse Father   . Hypertension Sister   . Hyperlipidemia Sister   . Alcohol abuse Brother   . Colon polyps Brother    Allergies  Allergen Reactions  . Codeine     REACTION: Severe nausea and vomitnig   Current Outpatient Prescriptions on File Prior to Visit  Medication Sig Dispense Refill  . levothyroxine (SYNTHROID, LEVOTHROID) 125 MCG tablet TAKE ONE  TABLET BY MOUTH EVERY DAY  90 tablet  1  . Calcium Carbonate-Vit D-Min 600-400 MG-UNIT TABS Take 1 tablet by mouth 2 (two) times daily.       No current facility-administered medications on file prior to visit.      Review of Systems Review of Systems  Constitutional: Negative for fever, appetite change, fatigue and unexpected weight change.  Eyes: Negative for pain and visual disturbance.  Respiratory: Negative for cough and shortness of breath.   Cardiovascular: Negative for cp or palpitations    Gastrointestinal: Negative for nausea, diarrhea and constipation.  Genitourinary: Negative for urgency and frequency.  Skin: Negative for pallor or rash   MSK pos for occ aches and pains without joint swelling  Neurological: Negative for weakness, light-headedness, numbness and headaches.  Hematological: Negative for adenopathy. Does not bruise/bleed easily.  Psychiatric/Behavioral: Negative for dysphoric mood. The patient is not nervous/anxious.          Objective:   Physical Exam  Constitutional: She appears well-developed and well-nourished. No distress.  overwt and well appearing   HENT:  Head: Normocephalic and atraumatic.  Right Ear: External ear normal.  Left Ear: External ear normal.  Nose: Nose normal.  Mouth/Throat: Oropharynx is clear and moist.  Eyes: Conjunctivae and EOM are normal. Right eye exhibits no discharge. Left eye exhibits no discharge. No scleral icterus.  Neck: Normal range of motion. Neck supple. No JVD present. Carotid bruit is not present. No thyromegaly present.  Cardiovascular: Normal rate, regular rhythm, normal heart sounds and intact distal pulses.  Exam reveals no gallop.   Pulmonary/Chest: Effort normal and breath sounds normal. No respiratory distress. She has no wheezes.  Abdominal: Soft. Bowel sounds are normal. She exhibits no distension, no abdominal bruit and no mass. There is no tenderness.  Genitourinary: Vagina normal and uterus normal. No breast swelling, tenderness, discharge or bleeding. There is no rash, tenderness or lesion on the right labia. There is no rash, tenderness or lesion on the left labia. Uterus is not enlarged and not tender. Cervix exhibits friability. Cervix exhibits no motion tenderness and no discharge. Right adnexum displays no mass, no tenderness and no fullness. Left adnexum displays no mass, no tenderness and no fullness. No erythema, tenderness or bleeding around the vagina. No vaginal discharge found.  Some scant bleeding from cervix upon pap (not from OS) Breast exam: No mass, nodules, thickening, tenderness, bulging, retraction, inflamation, nipple discharge or skin changes noted.  No axillary or clavicular LA.  Chaperoned exam.   Vaginal atrophy noted   Musculoskeletal: She exhibits no edema and no tenderness.  Lymphadenopathy:    She has no cervical adenopathy.  Neurological: She is alert. She has normal reflexes. No cranial nerve deficit. She exhibits normal  muscle tone. Coordination normal.  Skin: Skin is warm and dry. No rash noted. No erythema. No pallor.  Psychiatric: She has a normal mood and affect.          Assessment & Plan:

## 2012-06-30 NOTE — Assessment & Plan Note (Signed)
Disc goals for lipids and reasons to control them Rev labs with pt Rev low sat fat diet in detail Trig up a bit  Disc low fat diet and exercise

## 2012-06-30 NOTE — Assessment & Plan Note (Signed)
Reviewed health habits including diet and exercise and skin cancer prevention Also reviewed health mt list, fam hx and immunizations  Rev wellness labs Disc need for weight loss and good self care Pt will call ins about zoster vaccine

## 2012-06-30 NOTE — Assessment & Plan Note (Signed)
tsh is theraputic with no clinical changes Refilled current dose

## 2012-07-01 LAB — HM PAP SMEAR: HM Pap smear: NORMAL

## 2012-07-03 ENCOUNTER — Encounter: Payer: Self-pay | Admitting: *Deleted

## 2012-07-03 ENCOUNTER — Encounter: Payer: Self-pay | Admitting: Family Medicine

## 2012-11-21 ENCOUNTER — Telehealth: Payer: Self-pay

## 2012-11-21 MED ORDER — ZOSTER VACCINE LIVE 19400 UNT/0.65ML ~~LOC~~ SOLR: 0.6500 mL | Freq: Once | SUBCUTANEOUS | Status: DC

## 2012-11-21 NOTE — Telephone Encounter (Signed)
Sent electronically 

## 2012-11-21 NOTE — Telephone Encounter (Signed)
Pt request shingles vaccine order sent to [facility]. [facility] said [facility] will contact her ins co about coverage. Pt request cb when rx sent.

## 2012-11-21 NOTE — Telephone Encounter (Signed)
Pt notified Rx sent 

## 2013-11-04 ENCOUNTER — Telehealth: Payer: Self-pay | Admitting: Family Medicine

## 2013-11-04 NOTE — Telephone Encounter (Signed)
tsh if possible please-thanks

## 2013-11-04 NOTE — Telephone Encounter (Signed)
Patient called to schedule an appointment for lab work to refill her thyroid medication.  She hasn't been seen for over a year, so I scheduled a follow up appointment on 11/11/13.  Do you want patient to have lab work done before you see her?

## 2013-11-05 NOTE — Telephone Encounter (Signed)
Lab appt scheduled for 11/10/13 to check TSH

## 2013-11-09 ENCOUNTER — Other Ambulatory Visit: Payer: Self-pay | Admitting: Family Medicine

## 2013-11-09 DIAGNOSIS — E038 Other specified hypothyroidism: Secondary | ICD-10-CM

## 2013-11-10 ENCOUNTER — Other Ambulatory Visit: Payer: BC Managed Care – PPO

## 2013-11-11 ENCOUNTER — Ambulatory Visit: Payer: BC Managed Care – PPO | Admitting: Family Medicine

## 2013-11-11 DIAGNOSIS — Z0289 Encounter for other administrative examinations: Secondary | ICD-10-CM

## 2013-11-17 ENCOUNTER — Other Ambulatory Visit (INDEPENDENT_AMBULATORY_CARE_PROVIDER_SITE_OTHER): Payer: BC Managed Care – PPO

## 2013-11-17 DIAGNOSIS — E038 Other specified hypothyroidism: Secondary | ICD-10-CM

## 2013-11-17 LAB — TSH: TSH: 3.91 u[IU]/mL (ref 0.35–4.50)

## 2013-11-18 ENCOUNTER — Ambulatory Visit (INDEPENDENT_AMBULATORY_CARE_PROVIDER_SITE_OTHER): Payer: BC Managed Care – PPO | Admitting: Family Medicine

## 2013-11-18 ENCOUNTER — Encounter: Payer: Self-pay | Admitting: Family Medicine

## 2013-11-18 VITALS — BP 134/84 | HR 69 | Temp 98.3°F | Ht 67.0 in | Wt 198.0 lb

## 2013-11-18 DIAGNOSIS — E669 Obesity, unspecified: Secondary | ICD-10-CM

## 2013-11-18 DIAGNOSIS — E785 Hyperlipidemia, unspecified: Secondary | ICD-10-CM

## 2013-11-18 DIAGNOSIS — E039 Hypothyroidism, unspecified: Secondary | ICD-10-CM

## 2013-11-18 DIAGNOSIS — Z23 Encounter for immunization: Secondary | ICD-10-CM

## 2013-11-18 MED ORDER — LEVOTHYROXINE SODIUM 125 MCG PO TABS: 125.0000 ug | ORAL_TABLET | Freq: Every day | ORAL | Status: DC

## 2013-11-18 NOTE — Assessment & Plan Note (Signed)
Discussed how this problem influences overall health and the risks it imposes  Reviewed plan for weight loss with lower calorie diet (via better food choices and also portion control or program like weight watchers) and exercise building up to or more than 30 minutes 5 days per week including some aerobic activity   She may try an accountability program like myfitnesspal

## 2013-11-18 NOTE — Assessment & Plan Note (Signed)
Rev results from lifeline screen  Trig a bit high in low 200s Disc low cholesterol /fat diet  Also exercise

## 2013-11-18 NOTE — Progress Notes (Signed)
Pre visit review using our clinic review tool, if applicable. No additional management support is needed unless otherwise documented below in the visit note. 

## 2013-11-18 NOTE — Assessment & Plan Note (Signed)
tsh is stable No change in tx  No symptoms  Reassuring exam  Refilled levothyroxine for a year

## 2013-11-18 NOTE — Patient Instructions (Signed)
Flu vaccine today  Don't forget to make your mammogram appointment  Check out the app "myfitnesspal" for weight loss effort Try to exercise 5 days per week

## 2013-11-18 NOTE — Progress Notes (Signed)
Subjective:    Patient ID: Robin Gilmore, female    DOB: 07/16/1950, 63 y.o.   MRN: [id number]  HPI Here for f/u of chronic medical issues incl hypothyroidism   Working and cares for 32 yo grandchild several days per week  That is the light of her life  Another grandchild due in Jan   Had her Tdap 2013   Wants to get flu shot today  Hypothyroidism  Pt has no clinical changes No change in energy level/ hair or skin/ edema and no tremor Lab Results  Component Value Date   TSH 3.91 11/17/2013    No changes at all  No problems with levothyroid   Wt is stable  Obese  She went on herbalife for 6 mo- lost 15 lb  Then burned out on it  Gained it all back  Has had success with wt watchers in the past Hard to motivate to exercise   Mother had AAA She had screen with lifescreen -ultrasound -and it was nl   Tot chol 194 LDL 104 HDL 49 Trig 204  Triglycerides are down from previous but overall improved   Glucose 98   A1C 5.3   Patient Active Problem List   Diagnosis Date Noted  . Encounter for routine gynecological examination 06/30/2012  . Routine general medical examination at a health care facility 01/29/2011  . Family history of colonic polyps 01/29/2011  . Hypothyroidism 05/16/2009  . HYPERLIPIDEMIA 05/16/2009  . CYSTOCELE WITHOUT MENTION UTERINE PROLAPSE LAT 05/16/2009   Past Medical History  Diagnosis Date  . Hypothyroid   . Mitral prolapse and regurge used to have prophylaxis  . IBS (irritable bowel syndrome)   . Constipation   . Menopausal syndrome   . Obesity   . Hyperlipidemia    Past Surgical History  Procedure Laterality Date  . Cesarean section    . Retrovaginal fistula repair    . Breast surgery      breast biopsy x 2 benign   History  Substance Use Topics  . Smoking status: Former Research scientist (life sciences)  . Smokeless tobacco: Not on file  . Alcohol Use: No   Family History  Problem Relation Age of Onset  . Alcohol abuse Mother   . Diabetes  Mother   . Heart disease Mother     ? MI CAD  . Cancer Father     lung CA  . Alcohol abuse Father   . Hypertension Sister   . Hyperlipidemia Sister   . Alcohol abuse Brother   . Colon polyps Brother    Allergies  Allergen Reactions  . Codeine     REACTION: Severe nausea and vomitnig   Current Outpatient Prescriptions on File Prior to Visit  Medication Sig Dispense Refill  . Calcium Carbonate-Vit D-Min 600-400 MG-UNIT TABS Take 1 tablet by mouth 2 (two) times daily.    Marland Kitchen levothyroxine (SYNTHROID, LEVOTHROID) 125 MCG tablet Take 1 tablet (125 mcg total) by mouth daily. 90 tablet 3   No current facility-administered medications on file prior to visit.       Review of Systems Review of Systems  Constitutional: Negative for fever, appetite change, fatigue and unexpected weight change.  Eyes: Negative for pain and visual disturbance.  Respiratory: Negative for cough and shortness of breath.   Cardiovascular: Negative for cp or palpitations    Gastrointestinal: Negative for nausea, diarrhea and constipation.  Genitourinary: Negative for urgency and frequency.  Skin: Negative for pallor or rash   Neurological: Negative for  weakness, light-headedness, numbness and headaches.  Hematological: Negative for adenopathy. Does not bruise/bleed easily.  Psychiatric/Behavioral: Negative for dysphoric mood. The patient is not nervous/anxious.         Objective:   Physical Exam  Constitutional: She appears well-developed and well-nourished. No distress.  overwt and well appearing  HENT:  Head: Normocephalic and atraumatic.  Right Ear: External ear normal.  Left Ear: External ear normal.  Nose: Nose normal.  Mouth/Throat: Oropharynx is clear and moist.  Eyes: Conjunctivae and EOM are normal. Pupils are equal, round, and reactive to light. Right eye exhibits no discharge. Left eye exhibits no discharge. No scleral icterus.  Neck: Normal range of motion. Neck supple. No JVD present.  Carotid bruit is not present. No thyromegaly present.  Cardiovascular: Normal rate, regular rhythm, normal heart sounds and intact distal pulses.  Exam reveals no gallop.   Pulmonary/Chest: Effort normal and breath sounds normal. No respiratory distress. She has no wheezes. She has no rales.  Abdominal: Soft. Bowel sounds are normal. She exhibits no distension and no mass. There is no tenderness.  Musculoskeletal: She exhibits no edema or tenderness.  Lymphadenopathy:    She has no cervical adenopathy.  Neurological: She is alert. She has normal reflexes. No cranial nerve deficit. She exhibits normal muscle tone. Coordination normal.  No tremor   Skin: Skin is warm and dry. No rash noted. No erythema. No pallor.  Psychiatric: She has a normal mood and affect.          Assessment & Plan:   Problem List Items Addressed This Visit      Endocrine   Hypothyroidism - Primary    tsh is stable No change in tx  No symptoms  Reassuring exam  Refilled levothyroxine for a year     Relevant Medications      levothyroxine (SYNTHROID, LEVOTHROID) tablet     Other   Hyperlipidemia LDL goal <130    Rev results from lifeline screen  Trig a bit high in low 200s Disc low cholesterol /fat diet  Also exercise     Obesity    Discussed how this problem influences overall health and the risks it imposes  Reviewed plan for weight loss with lower calorie diet (via Gilmore food choices and also portion control or program like weight watchers) and exercise building up to or more than 30 minutes 5 days per week including some aerobic activity   She may try an accountability program like myfitnesspal      Other Visit Diagnoses    Need for prophylactic vaccination and inoculation against influenza        Relevant Orders       Flu Vaccine QUAD 36+ mos PF IM (Fluarix Quad PF) (Completed)

## 2014-02-18 ENCOUNTER — Other Ambulatory Visit: Payer: Self-pay | Admitting: *Deleted

## 2014-02-18 MED ORDER — LEVOTHYROXINE SODIUM 125 MCG PO TABS: 125.0000 ug | ORAL_TABLET | Freq: Every day | ORAL | Status: DC

## 2014-02-18 NOTE — Telephone Encounter (Signed)
Rx sent to [facility]. Patient change pharmacy.

## 2014-02-19 MED ORDER — LEVOTHYROXINE SODIUM 125 MCG PO TABS: 125.0000 ug | ORAL_TABLET | Freq: Every day | ORAL | Status: DC

## 2014-02-19 NOTE — Addendum Note (Signed)
Addended by: Jacqualin Combes on: 02/19/2014 11:32 AM   Modules accepted: Orders

## 2014-04-06 ENCOUNTER — Other Ambulatory Visit: Payer: Self-pay

## 2014-04-06 MED ORDER — LEVOTHYROXINE SODIUM 125 MCG PO TABS: 125.0000 ug | ORAL_TABLET | Freq: Every day | ORAL | Status: DC

## 2014-04-08 ENCOUNTER — Other Ambulatory Visit: Payer: Self-pay | Admitting: *Deleted

## 2014-07-12 ENCOUNTER — Ambulatory Visit (INDEPENDENT_AMBULATORY_CARE_PROVIDER_SITE_OTHER): Payer: Medicare Other | Admitting: Family Medicine

## 2014-07-12 ENCOUNTER — Encounter: Payer: Self-pay | Admitting: Family Medicine

## 2014-07-12 VITALS — BP 130/78 | HR 75 | Temp 98.6°F | Wt 201.8 lb

## 2014-07-12 DIAGNOSIS — R05 Cough: Secondary | ICD-10-CM

## 2014-07-12 DIAGNOSIS — R059 Cough, unspecified: Secondary | ICD-10-CM

## 2014-07-12 MED ORDER — ALBUTEROL SULFATE HFA 108 (90 BASE) MCG/ACT IN AERS: 2.0000 | INHALATION_SPRAY | Freq: Four times a day (QID) | RESPIRATORY_TRACT | Status: DC | PRN

## 2014-07-12 MED ORDER — CHLORPHENIRAMINE MALEATE 4 MG PO TABS: 4.0000 mg | ORAL_TABLET | Freq: Two times a day (BID) | ORAL | Status: DC | PRN

## 2014-07-12 NOTE — Progress Notes (Signed)
Pre visit review using our clinic review tool, if applicable. No additional management support is needed unless otherwise documented below in the visit note.  Sx started about 4 days ago.  Taking OTC meds.  Feels better today.  She has had bronchitis prev, "when it went down in my chest."   Some sputum, white.  No fevers.  No vomiting.  Sick contacts possible, at work.  Occ wheeze. Her eyes have been itchy.    She has a chronic cough for 2 years and works as a Theme park manager.  Former smoking, quit >30 years ago.  Usually she doesn't have much sputum with the chronic cough.  Laughing, environmental smoke, and getting hot will trigger the chronic cough.  Some wheeze noted.  She has used inhalers prev, in the distant past, but she improved after a mold problem was corrected at work.  No recent SABA use.    Meds, vitals, and allergies reviewed.   ROS: See HPI.  Otherwise, noncontributory.  GEN: nad, alert and oriented HEENT: mucous membranes moist, tm w/o erythema, nasal exam w/o erythema, scant clear discharge noted,  OP with minimal cobblestoning NECK: supple w/o LA CV: rrr.   PULM: ctab, no inc wob EXT: no edema

## 2014-07-12 NOTE — Patient Instructions (Signed)
Keep taking chlorpheniramine for now and use the inhaler if needed.  Update me as needed this week.  This was likely allergies or a virus and you shouldn't need antibiotics.  If the cough continues after this is over, then update Dr. Glori Bickers.  Take care.  Glad to see you.

## 2014-07-13 DIAGNOSIS — R059 Cough, unspecified: Secondary | ICD-10-CM | POA: Insufficient documentation

## 2014-07-13 DIAGNOSIS — R05 Cough: Secondary | ICD-10-CM | POA: Insufficient documentation

## 2014-07-13 NOTE — Assessment & Plan Note (Signed)
Chronic, with acute changes (though now better) likely from virus/URI.  D/w pt.  Reasonable to try SABA and then f/u with PCP re: chronic cough if needed.  She is okay for outpatient f/u.  Patient agrees.  No need for abx in this setting.  See AVS.

## 2014-08-12 ENCOUNTER — Encounter: Payer: Self-pay | Admitting: *Deleted

## 2014-08-19 ENCOUNTER — Other Ambulatory Visit: Payer: Self-pay

## 2014-08-19 DIAGNOSIS — Z1231 Encounter for screening mammogram for malignant neoplasm of breast: Secondary | ICD-10-CM

## 2014-08-25 ENCOUNTER — Ambulatory Visit
Admission: RE | Admit: 2014-08-25 | Discharge: 2014-08-25 | Disposition: A | Discharge status: Home / Self Care | Payer: Medicare Other | Source: Ambulatory Visit

## 2014-08-25 DIAGNOSIS — Z1231 Encounter for screening mammogram for malignant neoplasm of breast: Secondary | ICD-10-CM | POA: Diagnosis not present

## 2014-08-25 LAB — HM MAMMOGRAPHY: HM Mammogram: NORMAL

## 2014-08-26 ENCOUNTER — Encounter: Payer: Self-pay | Admitting: Family Medicine

## 2014-08-26 ENCOUNTER — Encounter: Payer: Self-pay | Admitting: *Deleted

## 2014-10-12 DIAGNOSIS — D1800 Hemangioma unspecified site: Secondary | ICD-10-CM | POA: Diagnosis not present

## 2014-10-12 DIAGNOSIS — L820 Inflamed seborrheic keratosis: Secondary | ICD-10-CM | POA: Diagnosis not present

## 2015-01-27 ENCOUNTER — Encounter: Payer: Self-pay | Admitting: Family Medicine

## 2015-01-28 MED ORDER — LEVOTHYROXINE SODIUM 125 MCG PO TABS: 125.0000 ug | ORAL_TABLET | Freq: Every day | ORAL | Status: DC

## 2015-03-23 DIAGNOSIS — H524 Presbyopia: Secondary | ICD-10-CM | POA: Diagnosis not present

## 2015-03-23 DIAGNOSIS — H25813 Combined forms of age-related cataract, bilateral: Secondary | ICD-10-CM | POA: Diagnosis not present

## 2015-04-04 DIAGNOSIS — J208 Acute bronchitis due to other specified organisms: Secondary | ICD-10-CM | POA: Diagnosis not present

## 2015-04-04 DIAGNOSIS — J069 Acute upper respiratory infection, unspecified: Secondary | ICD-10-CM | POA: Diagnosis not present

## 2015-07-28 ENCOUNTER — Telehealth: Payer: Self-pay | Admitting: Family Medicine

## 2015-07-28 DIAGNOSIS — E785 Hyperlipidemia, unspecified: Secondary | ICD-10-CM

## 2015-07-28 DIAGNOSIS — E039 Hypothyroidism, unspecified: Secondary | ICD-10-CM

## 2015-07-28 NOTE — Telephone Encounter (Signed)
-----   Message from Ellamae Sia sent at 07/26/2015  3:29 PM EDT ----- Regarding: Lab orders for Tuesday 7.18.17 Patient is scheduled for CPX labs, please order future labs, Thanks , Karna Christmas

## 2015-08-02 ENCOUNTER — Other Ambulatory Visit (INDEPENDENT_AMBULATORY_CARE_PROVIDER_SITE_OTHER): Payer: Medicare Other

## 2015-08-02 DIAGNOSIS — E039 Hypothyroidism, unspecified: Secondary | ICD-10-CM

## 2015-08-02 DIAGNOSIS — E785 Hyperlipidemia, unspecified: Secondary | ICD-10-CM

## 2015-08-02 LAB — COMPREHENSIVE METABOLIC PANEL
ALT: 20 U/L (ref 0–35)
AST: 18 U/L (ref 0–37)
Albumin: 4.3 g/dL (ref 3.5–5.2)
Alkaline Phosphatase: 54 U/L (ref 39–117)
BUN: 18 mg/dL (ref 6–23)
CO2: 29 mEq/L (ref 19–32)
Calcium: 9.6 mg/dL (ref 8.4–10.5)
Chloride: 103 mEq/L (ref 96–112)
Creatinine, Ser: 0.6 mg/dL (ref 0.40–1.20)
GFR: 106.14 mL/min (ref >60.00)
Glucose, Bld: 102 mg/dL — ABNORMAL HIGH (ref 70–99)
Potassium: 4 mEq/L (ref 3.5–5.1)
Sodium: 139 mEq/L (ref 135–145)
Total Bilirubin: 0.6 mg/dL (ref 0.2–1.2)
Total Protein: 7.3 g/dL (ref 6.0–8.3)

## 2015-08-02 LAB — LIPID PANEL
Cholesterol: 176 mg/dL (ref 0–200)
HDL: 45.6 mg/dL (ref >39.00)
LDL Cholesterol: 98 mg/dL (ref 0–99)
NonHDL: 130.81
Total CHOL/HDL Ratio: 4
Triglycerides: 162 mg/dL — ABNORMAL HIGH (ref 0.0–149.0)
VLDL: 32.4 mg/dL (ref 0.0–40.0)

## 2015-08-02 LAB — CBC WITH DIFFERENTIAL/PLATELET
Basophils Absolute: 0 10*3/uL (ref 0.0–0.1)
Basophils Relative: 0.6 % (ref 0.0–3.0)
Eosinophils Absolute: 0.1 10*3/uL (ref 0.0–0.7)
Eosinophils Relative: 1.4 % (ref 0.0–5.0)
HCT: 39.9 % (ref 36.0–46.0)
Hemoglobin: 13.5 g/dL (ref 12.0–15.0)
Lymphocytes Relative: 23.2 % (ref 12.0–46.0)
Lymphs Abs: 1.7 10*3/uL (ref 0.7–4.0)
MCHC: 33.8 g/dL (ref 30.0–36.0)
MCV: 83.5 fl (ref 78.0–100.0)
Monocytes Absolute: 0.6 10*3/uL (ref 0.1–1.0)
Monocytes Relative: 8.1 % (ref 3.0–12.0)
Neutro Abs: 5 10*3/uL (ref 1.4–7.7)
Neutrophils Relative %: 66.7 % (ref 43.0–77.0)
Platelets: 285 10*3/uL (ref 150.0–400.0)
RBC: 4.78 Mil/uL (ref 3.87–5.11)
RDW: 14 % (ref 11.5–15.5)
WBC: 7.5 10*3/uL (ref 4.0–10.5)

## 2015-08-02 LAB — TSH: TSH: 6.88 u[IU]/mL — ABNORMAL HIGH (ref 0.35–4.50)

## 2015-08-09 ENCOUNTER — Encounter: Payer: Self-pay | Admitting: Family Medicine

## 2015-08-09 ENCOUNTER — Ambulatory Visit (INDEPENDENT_AMBULATORY_CARE_PROVIDER_SITE_OTHER): Payer: Medicare Other | Admitting: Family Medicine

## 2015-08-09 VITALS — BP 122/70 | HR 67 | Temp 97.9°F | Ht 67.0 in | Wt 199.8 lb

## 2015-08-09 DIAGNOSIS — E2839 Other primary ovarian failure: Secondary | ICD-10-CM | POA: Diagnosis not present

## 2015-08-09 DIAGNOSIS — E785 Hyperlipidemia, unspecified: Secondary | ICD-10-CM | POA: Diagnosis not present

## 2015-08-09 DIAGNOSIS — E669 Obesity, unspecified: Secondary | ICD-10-CM | POA: Diagnosis not present

## 2015-08-09 DIAGNOSIS — Z23 Encounter for immunization: Secondary | ICD-10-CM

## 2015-08-09 DIAGNOSIS — E039 Hypothyroidism, unspecified: Secondary | ICD-10-CM

## 2015-08-09 MED ORDER — PNEUMOCOCCAL 13-VAL CONJ VACC IM SUSP
0.5000 mL | INTRAMUSCULAR | Status: AC
  Administered 2015-08-09: 0.5 mL via INTRAMUSCULAR

## 2015-08-09 MED ORDER — LEVOTHYROXINE SODIUM 125 MCG PO TABS: 125.0000 ug | ORAL_TABLET | Freq: Every day | ORAL | 3 refills | Status: DC

## 2015-08-09 NOTE — Assessment & Plan Note (Signed)
Hypothyroidism  Pt has no clinical changes No change in energy level/ hair or skin/ edema and no tremor Lab Results  Component Value Date   TSH 6.88 (H) 08/02/2015     Has missed doses - expect this is why it is high Refill done  Enc compliance

## 2015-08-09 NOTE — Patient Instructions (Addendum)
Please schedule medicare nurse visit on the way out  Goal for exercise is 30 minutes 5 days per weeks (indoor or outdoor) -work up to it  Cut portions by 1/3  Try to make some limits with sweets  Stop at check out for referral for bone density test  Don't forget to schedule your mammogram for august  prevnar vaccine today Don't forget a flu shot in the fall

## 2015-08-09 NOTE — Assessment & Plan Note (Signed)
Some improvement with better diet Disc goals for lipids and reasons to control them Rev labs with pt Rev low sat fat diet in detail

## 2015-08-09 NOTE — Progress Notes (Signed)
Pre visit review using our clinic review tool, if applicable. No additional management support is needed unless otherwise documented below in the visit note. 

## 2015-08-09 NOTE — Assessment & Plan Note (Signed)
Discussed how this problem influences overall health and the risks it imposes  Reviewed plan for weight loss with lower calorie diet (via better food choices and also portion control or program like weight watchers) and exercise building up to or more than 30 minutes 5 days per week including some aerobic activity   Enc her to keep working on it with lifestyle change - instead of diet  See AVS

## 2015-08-09 NOTE — Assessment & Plan Note (Signed)
Ref for dexa 

## 2015-08-09 NOTE — Progress Notes (Signed)
Subjective:    Patient ID: Robin Gilmore, female    DOB: 30-May-1950, 65 y.o.   MRN: [id number]  HPI Here for annual follow up of chronic medical problems   Is feeling good  A little post nasal drip - took an allergy pill  Some aches and pains   Needs to set up an AMW visit as well   Wt Readings from Last 3 Encounters:  08/09/15 199 lb 12 oz (90.6 kg)  07/12/14 201 lb 12 oz (91.5 kg)  11/18/13 198 lb (89.8 kg)   bmi is 31.29 She tries to loose with "diets" and then gains it back  Is a healthy eater but loves sweets (ice cream)  Walks a few times per week  Obese range   Pap test nl 6/14 Hx of prior rectovaginal fistula-repaired No new sexual partner  No gyn symptoms  No abn paps or cervical dysplasia   dexa 1/03 normal  Falls- one/she tripped over her cat on Sunday night - fell on face and knee- but no injuries  Fracture hx -fracture of ankle many years ago Ca and D-takes reguarly   She continues to work - enjoys it   Mammogram 8/16 negative Self breast exam -no lumps or changes   Colonoscopy 3/13-normal with 5 year recall (fam hx)   Hep C screen Not interested-not high risk   PNA due for PCV 13  Flu shot - did not get a flu shot last fall/forgot Will get one this year  Tetanus shot 1/13  Hypothyroidism  Pt has no clinical changes No change in energy level/ hair or skin/ edema and no tremor Lab Results  Component Value Date   TSH 6.88 (H) 08/02/2015    Has missed some doses (about 6 doses) Needs refill again    Zoster vaccine 11/14  Hx of hyperlipidemia Lab Results  Component Value Date   CHOL 176 08/02/2015   CHOL 182 06/23/2012   CHOL 195 12/04/2010   Lab Results  Component Value Date   HDL 45.60 08/02/2015   HDL 39.60 06/23/2012   HDL 49.90 12/04/2010   Lab Results  Component Value Date   LDLCALC 98 08/02/2015   LDLCALC 113 (H) 12/04/2010   LDLCALC 95 11/21/2009   Lab Results  Component Value Date   TRIG 162.0 (H) 08/02/2015    TRIG 208.0 (H) 06/23/2012   TRIG 162.0 (H) 12/04/2010   Lab Results  Component Value Date   CHOLHDL 4 08/02/2015   CHOLHDL 5 06/23/2012   CHOLHDL 4 12/04/2010   Lab Results  Component Value Date   LDLDIRECT 95.1 06/23/2012   some improvement - diet   BP Readings from Last 3 Encounters:  08/09/15 122/70  07/12/14 130/78  11/18/13 134/84     Chemistry      Component Value Date/Time   NA 139 08/02/2015 0807   K 4.0 08/02/2015 0807   CL 103 08/02/2015 0807   CO2 29 08/02/2015 0807   BUN 18 08/02/2015 0807   CREATININE 0.60 08/02/2015 0807      Component Value Date/Time   CALCIUM 9.6 08/02/2015 0807   ALKPHOS 54 08/02/2015 0807   AST 18 08/02/2015 0807   ALT 20 08/02/2015 0807   BILITOT 0.6 08/02/2015 0807      Lab Results  Component Value Date   WBC 7.5 08/02/2015   HGB 13.5 08/02/2015   HCT 39.9 08/02/2015   MCV 83.5 08/02/2015   PLT 285.0 08/02/2015     Patient Active  Problem List   Diagnosis Date Noted  . Estrogen deficiency 08/09/2015  . Cough 07/13/2014  . Obesity 11/18/2013  . Encounter for routine gynecological examination 06/30/2012  . Routine general medical examination at a health care facility 01/29/2011  . Family history of colonic polyps 01/29/2011  . Hypothyroidism 05/16/2009  . Hyperlipidemia LDL goal <130 05/16/2009  . CYSTOCELE WITHOUT MENTION UTERINE PROLAPSE LAT 05/16/2009   Past Medical History:  Diagnosis Date  . Constipation   . Hyperlipidemia   . Hypothyroid   . IBS (irritable bowel syndrome)   . Menopausal syndrome   . Mitral prolapse and regurge used to have prophylaxis  . Obesity    Past Surgical History:  Procedure Laterality Date  . BREAST SURGERY     breast biopsy x 2 benign  . CESAREAN SECTION    . retrovaginal fistula repair     Social History  Substance Use Topics  . Smoking status: Former Research scientist (life sciences)  . Smokeless tobacco: Never Used  . Alcohol use No   Family History  Problem Relation Age of Onset  .  Alcohol abuse Mother   . Diabetes Mother   . Heart disease Mother     ? MI CAD  . Cancer Father     lung CA  . Alcohol abuse Father   . Hypertension Sister   . Hyperlipidemia Sister   . Alcohol abuse Brother   . Colon polyps Brother    Allergies  Allergen Reactions  . Codeine     REACTION: Severe nausea and vomitnig   Current Outpatient Prescriptions on File Prior to Visit  Medication Sig Dispense Refill  . albuterol (PROVENTIL HFA;VENTOLIN HFA) 108 (90 BASE) MCG/ACT inhaler Inhale 2 puffs into the lungs every 6 (six) hours as needed for wheezing or shortness of breath. 1 Inhaler 0  . Calcium Carbonate-Vit D-Min 600-400 MG-UNIT TABS Take 1 tablet by mouth 2 (two) times daily.    . chlorpheniramine (CHLOR-TRIMETON) 4 MG tablet Take 1 tablet (4 mg total) by mouth 2 (two) times daily as needed for allergies.    . Dextromethorphan-Guaifenesin (ROBITUSSIN DM PO) Take by mouth as needed.     No current facility-administered medications on file prior to visit.     Review of Systems Review of Systems  Constitutional: Negative for fever, appetite change, fatigue and unexpected weight change.  Eyes: Negative for pain and visual disturbance.  Respiratory: Negative for cough and shortness of breath.   Cardiovascular: Negative for cp or palpitations    Gastrointestinal: Negative for nausea, diarrhea and constipation.  Genitourinary: Negative for urgency and frequency.  Skin: Negative for pallor or rash   Neurological: Negative for weakness, light-headedness, numbness and headaches.  Hematological: Negative for adenopathy. Does not bruise/bleed easily.  Psychiatric/Behavioral: Negative for dysphoric mood. The patient is not nervous/anxious.  some stress regarding husband's personality change with age       Objective:   Physical Exam  Constitutional: She appears well-developed and well-nourished. No distress.  obese and well appearing   HENT:  Head: Normocephalic and atraumatic.    Right Ear: External ear normal.  Left Ear: External ear normal.  Mouth/Throat: Oropharynx is clear and moist.  Eyes: Conjunctivae and EOM are normal. Pupils are equal, round, and reactive to light. No scleral icterus.  Neck: Normal range of motion. Neck supple. No JVD present. Carotid bruit is not present. No thyromegaly present.  Cardiovascular: Normal rate, regular rhythm, normal heart sounds and intact distal pulses.  Exam reveals no gallop.  Pulmonary/Chest: Effort normal and breath sounds normal. No respiratory distress. She has no wheezes. She exhibits no tenderness.  Abdominal: Soft. Bowel sounds are normal. She exhibits no distension, no abdominal bruit and no mass. There is no tenderness.  Genitourinary: No breast swelling, tenderness, discharge or bleeding.  Genitourinary Comments: Breast exam: No mass, nodules, thickening, tenderness, bulging, retraction, inflamation, nipple discharge or skin changes noted.  No axillary or clavicular LA.      Musculoskeletal: Normal range of motion. She exhibits no edema or tenderness.  Lymphadenopathy:    She has no cervical adenopathy.  Neurological: She is alert. She has normal reflexes. No cranial nerve deficit. She exhibits normal muscle tone. Coordination normal.  Skin: Skin is warm and dry. No rash noted. No erythema. No pallor.  Psychiatric: She has a normal mood and affect.          Assessment & Plan:   Problem List Items Addressed This Visit      Endocrine   Hypothyroidism - Primary    Hypothyroidism  Pt has no clinical changes No change in energy level/ hair or skin/ edema and no tremor Lab Results  Component Value Date   TSH 6.88 (H) 08/02/2015     Has missed doses - expect this is why it is high Refill done  Enc compliance      Relevant Medications   levothyroxine (SYNTHROID, LEVOTHROID) 125 MCG tablet     Other   Obesity    Discussed how this problem influences overall health and the risks it imposes  Reviewed  plan for weight loss with lower calorie diet (via better food choices and also portion control or program like weight watchers) and exercise building up to or more than 30 minutes 5 days per week including some aerobic activity   Enc her to keep working on it with lifestyle change - instead of diet  See AVS      Hyperlipidemia LDL goal <130    Some improvement with better diet Disc goals for lipids and reasons to control them Rev labs with pt Rev low sat fat diet in detail       Estrogen deficiency    Ref for dexa      Relevant Orders   DG Bone Density    Other Visit Diagnoses    Need for vaccination with 13-polyvalent pneumococcal conjugate vaccine       Relevant Medications   pneumococcal 13-valent conjugate vaccine (PREVNAR 13) injection 0.5 mL (Completed) (Start on 08/10/2015 10:00 AM)

## 2015-08-23 ENCOUNTER — Other Ambulatory Visit: Payer: Self-pay | Admitting: Family Medicine

## 2015-08-23 ENCOUNTER — Ambulatory Visit (INDEPENDENT_AMBULATORY_CARE_PROVIDER_SITE_OTHER): Payer: Medicare Other

## 2015-08-23 VITALS — BP 136/80 | HR 67 | Temp 98.0°F | Ht 67.0 in | Wt 200.8 lb

## 2015-08-23 DIAGNOSIS — Z0000 Encounter for general adult medical examination without abnormal findings: Secondary | ICD-10-CM

## 2015-08-23 DIAGNOSIS — Z1231 Encounter for screening mammogram for malignant neoplasm of breast: Secondary | ICD-10-CM

## 2015-08-23 NOTE — Progress Notes (Signed)
PCP notes:   Health maintenance:  Flu vaccine - addressed Bone density - scheduled Mammogram - scheduled   Abnormal screenings:   Hearing - failed Fall risk - hx of fall without injury  Patient concerns:   None  Nurse concerns:  None  Next PCP appt:   None (already had CPE)  I reviewed health advisor's note, was available for consultation, and agree with documentation and plan. Loura Pardon MD

## 2015-08-23 NOTE — Patient Instructions (Addendum)
Robin Gilmore , Thank you for taking time to come for your Medicare Wellness Visit. I appreciate your ongoing commitment to your health goals. Please review the following plan we discussed and let me know if I can assist you in the future.   These are the goals we discussed: Goals    . Increase physical activity          Starting 08/23/15, I will continue to walk at 30 min 2-3 days a week and to do floor exercises for at least 15 min 3 days per week.        This is a list of the screening recommended for you and due dates:  Health Maintenance  Topic Date Due  . DEXA scan (bone density measurement)  01/15/2016  . Flu Shot  01/15/2016  .  Hepatitis C: One time screening is recommended by Center for Disease Control  (CDC) for  adults born from [birth year] through 1966.   02/17/2023*  . Mammogram  08/25/2015  . Pneumonia vaccines (2 of 2 - PPSV23) 08/08/2016  . Tetanus Vaccine  01/28/2021  . Colon Cancer Screening  04/01/2021  . Shingles Vaccine  Completed  *Topic was postponed. The date shown is not the original due date.   Preventive Care for Adults  A healthy lifestyle and preventive care can promote health and wellness. Preventive health guidelines for adults include the following key practices.  . A routine yearly physical is a good way to check with your health care provider about your health and preventive screening. It is a chance to share any concerns and updates on your health and to receive a thorough exam.  . Visit your dentist for a routine exam and preventive care every 6 months. Brush your teeth twice a day and floss once a day. Good oral hygiene prevents tooth decay and gum disease.  . The frequency of eye exams is based on your age, health, family medical history, use  of contact lenses, and other factors. Follow your health care provider's ecommendations for frequency of eye exams.  . Eat a healthy diet. Foods like vegetables, fruits, whole grains, low-fat dairy products, and  lean protein foods contain the nutrients you need without too many calories. Decrease your intake of foods high in solid fats, added sugars, and salt. Eat the right amount of calories for you. Get information about a proper diet from your health care provider, if necessary.  . Regular physical exercise is one of the most important things you can do for your health. Most adults should get at least 150 minutes of moderate-intensity exercise (any activity that increases your heart rate and causes you to sweat) each week. In addition, most adults need muscle-strengthening exercises on 2 or more days a week.  Silver Sneakers may be a benefit available to you. To determine eligibility, you may visit the website: www.silversneakers.com or contact program at 870-042-8366 Mon-Fri between 8AM-8PM.   . Maintain a healthy weight. The body mass index (BMI) is a screening tool to identify possible weight problems. It provides an estimate of body fat based on height and weight. Your health care provider can find your BMI and can help you achieve or maintain a healthy weight.   For adults 20 years and older: ? A BMI below 18.5 is considered underweight. ? A BMI of 18.5 to 24.9 is normal. ? A BMI of 25 to 29.9 is considered overweight. ? A BMI of 30 and above is considered obese.   . Maintain  normal blood lipids and cholesterol levels by exercising and minimizing your intake of saturated fat. Eat a balanced diet with plenty of fruit and vegetables. Blood tests for lipids and cholesterol should begin at age 86 and be repeated every 5 years. If your lipid or cholesterol levels are high, you are over 50, or you are at high risk for heart disease, you may need your cholesterol levels checked more frequently. Ongoing high lipid and cholesterol levels should be treated with medicines if diet and exercise are not working.  . If you smoke, find out from your health care provider how to quit. If you do not use tobacco,  please do not start.  . If you choose to drink alcohol, please do not consume more than 2 drinks per day. One drink is considered to be 12 ounces (355 mL) of beer, 5 ounces (148 mL) of wine, or 1.5 ounces (44 mL) of liquor.  . If you are 22-52 years old, ask your health care provider if you should take aspirin to prevent strokes.  . Use sunscreen. Apply sunscreen liberally and repeatedly throughout the day. You should seek shade when your shadow is shorter than you. Protect yourself by wearing long sleeves, pants, a wide-brimmed hat, and sunglasses year round, whenever you are outdoors.  . Once a month, do a whole body skin exam, using a mirror to look at the skin on your back. Tell your health care provider of new moles, moles that have irregular borders, moles that are larger than a pencil eraser, or moles that have changed in shape or color.

## 2015-08-23 NOTE — Progress Notes (Signed)
Subjective:   Robin Gilmore is a 65 y.o. female who presents for an Initial Medicare Annual Wellness Visit.  Review of Systems    N/A  Cardiac Risk Factors include: advanced age (>65men, >63 women);dyslipidemia;obesity (BMI >30kg/m2)     Objective:    Today's Vitals   08/23/15 1028  BP: 136/80  Pulse: 67  Temp: 98 F (36.7 C)  TempSrc: Oral  SpO2: 97%  Weight: 200 lb 12 oz (91.1 kg)  Height: 5\' 7"  (1.702 m)  PainSc: 0-No pain   Body mass index is 31.44 kg/m.   Current Medications (verified) Outpatient Encounter Prescriptions as of 08/23/2015  Medication Sig  . albuterol (PROVENTIL HFA;VENTOLIN HFA) 108 (90 BASE) MCG/ACT inhaler Inhale 2 puffs into the lungs every 6 (six) hours as needed for wheezing or shortness of breath.  . Calcium Carbonate-Vit D-Min 600-400 MG-UNIT TABS Take 1 tablet by mouth 2 (two) times daily.  . chlorpheniramine (CHLOR-TRIMETON) 4 MG tablet Take 1 tablet (4 mg total) by mouth 2 (two) times daily as needed for allergies.  . Dextromethorphan-Guaifenesin (ROBITUSSIN DM PO) Take by mouth as needed.  Marland Kitchen levothyroxine (SYNTHROID, LEVOTHROID) 125 MCG tablet Take 1 tablet (125 mcg total) by mouth daily.   No facility-administered encounter medications on file as of 08/23/2015.     Allergies (verified) Codeine   History: Past Medical History:  Diagnosis Date  . Constipation   . Hyperlipidemia   . Hypothyroid   . IBS (irritable bowel syndrome)   . Menopausal syndrome   . Mitral prolapse and regurge used to have prophylaxis  . Obesity    Past Surgical History:  Procedure Laterality Date  . BREAST SURGERY     breast biopsy x 2 benign  . CESAREAN SECTION    . retrovaginal fistula repair     Family History  Problem Relation Age of Onset  . Alcohol abuse Mother   . Diabetes Mother   . Heart disease Mother     ? MI CAD  . Cancer Father     lung CA  . Alcohol abuse Father   . Hypertension Sister   . Hyperlipidemia Sister   . Alcohol  abuse Brother   . Colon polyps Brother    Social History   Occupational History  . Not on file.   Social History Main Topics  . Smoking status: Former Research scientist (life sciences)  . Smokeless tobacco: Never Used  . Alcohol use No  . Drug use: No  . Sexual activity: No    Tobacco Counseling Counseling given: No   Activities of Daily Living In your present state of health, do you have any difficulty performing the following activities: 08/23/2015  Hearing? N  Vision? N  Difficulty concentrating or making decisions? N  Walking or climbing stairs? N  Dressing or bathing? N  Doing errands, shopping? N  Preparing Food and eating ? N  Using the Toilet? N  In the past six months, have you accidently leaked urine? N  Do you have problems with loss of bowel control? N  Managing your Medications? N  Managing your Finances? N  Housekeeping or managing your Housekeeping? N  Some recent data might be hidden    Immunizations and Health Maintenance Immunization History  Administered Date(s) Administered  . Influenza Split 01/29/2011  . Influenza Whole 11/21/2009  . Influenza,inj,Quad PF,36+ Mos 11/18/2013  . Influenza-Unspecified 11/12/2012  . Pneumococcal Conjugate-13 08/09/2015  . Td 11/15/2001  . Tdap 01/29/2011  . Zoster 11/22/2012   There are  no preventive care reminders to display for this patient.  Patient Care Team: Abner Greenspan, MD as PCP - [place], OD as Optometry    Assessment:   This is a routine wellness examination for [name].   Hearing/Vision screen  Hearing Screening   125Hz  250Hz  500Hz  1000Hz  2000Hz  3000Hz  4000Hz  6000Hz  8000Hz   Right ear:   40 40 40  0    Left ear:   40 40 40  0    Vision Screening Comments: Last vision exam in April 2017 with Dr. Izell [place]  Dietary issues and exercise activities discussed: Current Exercise Habits: Home exercise routine, Type of exercise: walking;calisthenics, Time (Minutes): 30, Frequency (Times/Week): 7, Weekly Exercise  (Minutes/Week): 210, Intensity: Moderate, Exercise limited by: None identified  Goals    . Increase physical activity          Starting 08/23/15, I will continue to walk at 30 min 2-3 days a week and to do floor exercises for at least 15 min 3 days per week.       Depression Screen PHQ 2/9 Scores 08/23/2015 06/30/2012  PHQ - 2 Score 0 0    Fall Risk Fall Risk  08/23/2015  Falls in the past year? Yes  Number falls in past yr: 1  Injury with Fall? No  Follow up Falls evaluation completed    Cognitive Function: MMSE - Mini Mental State Exam 08/23/2015  Orientation to time 5  Orientation to Place 5  Registration 3  Attention/ Calculation 0  Recall 3  Language- name 2 objects 0  Language- repeat 1  Language- follow 3 step command 3  Language- read & follow direction 0  Write a sentence 0  Copy design 0  Total score 20   PLEASE NOTE: A Mini-Cog screen was completed. Maximum score is 20. A value of 0 denotes this part of Folstein MMSE was not completed or the patient failed this part of the Mini-Cog screening.   Mini-Cog Screening Orientation to Time - Max 5 pts Orientation to Place - Max 5 pts Registration - Max 3 pts Recall - Max 3 pts Language Repeat - Max 1 pts Language Follow 3 Step Command - Max 3 pts  Screening Tests Health Maintenance  Topic Date Due  . INFLUENZA VACCINE  01/15/2016 (Originally 08/16/2015)  . DEXA SCAN  01/15/2016 (Originally 01/25/2014)  . Hepatitis C Screening  02/17/2023 (Originally [birth year])  . MAMMOGRAM  08/25/2015  . PNA vac Low Risk Adult (2 of 2 - PPSV23) 08/08/2016  . TETANUS/TDAP  01/28/2021  . COLONOSCOPY  04/01/2021  . ZOSTAVAX  Completed      Plan:     I have personally reviewed and addressed the Medicare Annual Wellness questionnaire and have noted the following in the patient's chart:  A. Medical and social history B. Use of alcohol, tobacco or illicit drugs  C. Current medications and supplements D. Functional ability and  status E.  Nutritional status F.  Physical activity G. Advance directives H. List of other physicians I.  Hospitalizations, surgeries, and ER visits in previous 12 months J.  [place] to include hearing, vision, cognitive, depression L. Referrals and appointments - none  In addition, I have reviewed and discussed with patient certain preventive protocols, quality metrics, and best practice recommendations. A written personalized care plan for preventive services as well as general preventive health recommendations were provided to patient.  See attached scanned questionnaire for additional information.   Signed,   Lindell Noe, MHA,  BS, LPN Health Advisor

## 2015-08-23 NOTE — Progress Notes (Signed)
Pre visit review using our clinic review tool, if applicable. No additional management support is needed unless otherwise documented below in the visit note. 

## 2015-09-21 DIAGNOSIS — Z23 Encounter for immunization: Secondary | ICD-10-CM | POA: Diagnosis not present

## 2015-11-14 ENCOUNTER — Ambulatory Visit
Admission: RE | Admit: 2015-11-14 | Discharge: 2015-11-14 | Disposition: A | Discharge status: Home / Self Care | Payer: Medicare Other | Source: Ambulatory Visit | Attending: Family Medicine | Admitting: Family Medicine

## 2015-11-14 DIAGNOSIS — Z1231 Encounter for screening mammogram for malignant neoplasm of breast: Secondary | ICD-10-CM

## 2015-11-14 DIAGNOSIS — Z780 Asymptomatic menopausal state: Secondary | ICD-10-CM | POA: Diagnosis not present

## 2015-11-14 DIAGNOSIS — E2839 Other primary ovarian failure: Secondary | ICD-10-CM

## 2015-11-14 DIAGNOSIS — Z13820 Encounter for screening for osteoporosis: Secondary | ICD-10-CM | POA: Diagnosis not present

## 2015-11-16 ENCOUNTER — Other Ambulatory Visit: Payer: Self-pay | Admitting: Family Medicine

## 2015-11-16 DIAGNOSIS — R928 Other abnormal and inconclusive findings on diagnostic imaging of breast: Secondary | ICD-10-CM

## 2015-11-22 ENCOUNTER — Ambulatory Visit
Admission: RE | Admit: 2015-11-22 | Discharge: 2015-11-22 | Disposition: A | Discharge status: Home / Self Care | Payer: Medicare Other | Source: Ambulatory Visit | Attending: Family Medicine | Admitting: Family Medicine

## 2015-11-22 DIAGNOSIS — R928 Other abnormal and inconclusive findings on diagnostic imaging of breast: Secondary | ICD-10-CM

## 2015-11-22 DIAGNOSIS — R921 Mammographic calcification found on diagnostic imaging of breast: Secondary | ICD-10-CM | POA: Diagnosis not present

## 2015-12-19 DIAGNOSIS — B029 Zoster without complications: Secondary | ICD-10-CM | POA: Diagnosis not present

## 2016-07-19 DIAGNOSIS — H1045 Other chronic allergic conjunctivitis: Secondary | ICD-10-CM | POA: Diagnosis not present

## 2016-07-21 ENCOUNTER — Other Ambulatory Visit: Payer: Self-pay | Admitting: Family Medicine

## 2016-07-23 NOTE — Telephone Encounter (Signed)
Pt hasn't been seen in almost a year and no future appts., please advise  

## 2016-07-23 NOTE — Telephone Encounter (Signed)
Please schedule aug f/u (we will do labs that day) and refill until then

## 2016-07-24 NOTE — Telephone Encounter (Signed)
Pt called back and schedule appt., med refilled

## 2016-07-24 NOTE — Telephone Encounter (Signed)
Left voicemail requesting pt to call the office back 

## 2016-07-25 DIAGNOSIS — H20041 Secondary noninfectious iridocyclitis, right eye: Secondary | ICD-10-CM | POA: Diagnosis not present

## 2016-08-15 ENCOUNTER — Encounter: Payer: Self-pay | Admitting: Family Medicine

## 2016-08-15 ENCOUNTER — Ambulatory Visit (INDEPENDENT_AMBULATORY_CARE_PROVIDER_SITE_OTHER): Payer: Medicare Other | Admitting: Family Medicine

## 2016-08-15 VITALS — BP 140/78 | HR 78 | Temp 97.7°F | Ht 67.0 in | Wt 202.5 lb

## 2016-08-15 DIAGNOSIS — E6609 Other obesity due to excess calories: Secondary | ICD-10-CM | POA: Diagnosis not present

## 2016-08-15 DIAGNOSIS — Z6831 Body mass index (BMI) 31.0-31.9, adult: Secondary | ICD-10-CM

## 2016-08-15 DIAGNOSIS — E039 Hypothyroidism, unspecified: Secondary | ICD-10-CM | POA: Diagnosis not present

## 2016-08-15 DIAGNOSIS — E785 Hyperlipidemia, unspecified: Secondary | ICD-10-CM

## 2016-08-15 DIAGNOSIS — Z23 Encounter for immunization: Secondary | ICD-10-CM | POA: Diagnosis not present

## 2016-08-15 LAB — LIPID PANEL
Cholesterol: 186 mg/dL (ref 0–200)
HDL: 42.3 mg/dL (ref >39.00)
NonHDL: 143.82
Total CHOL/HDL Ratio: 4
Triglycerides: 390 mg/dL — ABNORMAL HIGH (ref 0.0–149.0)
VLDL: 78 mg/dL — ABNORMAL HIGH (ref 0.0–40.0)

## 2016-08-15 LAB — TSH: TSH: 2.55 u[IU]/mL (ref 0.35–4.50)

## 2016-08-15 LAB — LDL CHOLESTEROL, DIRECT: Direct LDL: 82 mg/dL

## 2016-08-15 MED ORDER — ALBUTEROL SULFATE HFA 108 (90 BASE) MCG/ACT IN AERS: 2.0000 | INHALATION_SPRAY | Freq: Four times a day (QID) | RESPIRATORY_TRACT | 3 refills | Status: DC | PRN

## 2016-08-15 NOTE — Progress Notes (Signed)
Subjective:    Patient ID: Robin Gilmore, female    DOB: 07-03-1950, 66 y.o.   MRN: [id number]  HPI Here for f/u of chronic health problems   Still working part time  Keeping grand kids 2 and 7 yo     Wt Readings from Last 3 Encounters:  08/15/16 202 lb 8 oz (91.9 kg)  08/23/15 200 lb 12 oz (91.1 kg)  08/09/15 199 lb 12 oz (90.6 kg)  not eating well  No exercise program- but she tries to walk 2-3 days per week  31.72 kg/m  Hypothyroidism  Pt has no clinical changes No change in energy level/ hair or skin/ edema and no tremor Lab Results  Component Value Date   TSH 6.88 (H) 08/02/2015    No missed doses this time  Cannot tell a difference if it changes   Hx of hyperlipidemia Lab Results  Component Value Date   CHOL 176 08/02/2015   HDL 45.60 08/02/2015   LDLCALC 98 08/02/2015   LDLDIRECT 95.1 06/23/2012   TRIG 162.0 (H) 08/02/2015   CHOLHDL 4 08/02/2015  diet- not a lot of red meat or fried food or greasy food  Uses ground [place]    Due for ppsv23  Needs inhaler refill- uses infrequently   Patient Active Problem List   Diagnosis Date Noted  . Estrogen deficiency 08/09/2015  . Obesity 11/18/2013  . Encounter for routine gynecological examination 06/30/2012  . Routine general medical examination at a health care facility 01/29/2011  . Family history of colonic polyps 01/29/2011  . Hypothyroidism 05/16/2009  . Hyperlipidemia LDL goal <130 05/16/2009  . CYSTOCELE WITHOUT MENTION UTERINE PROLAPSE LAT 05/16/2009   Past Medical History:  Diagnosis Date  . Constipation   . Hyperlipidemia   . Hypothyroid   . IBS (irritable bowel syndrome)   . Menopausal syndrome   . Mitral prolapse and regurge used to have prophylaxis  . Obesity    Past Surgical History:  Procedure Laterality Date  . BREAST SURGERY     breast biopsy x 2 benign  . CESAREAN SECTION    . retrovaginal fistula repair     Social History  Substance Use Topics  . Smoking status:  Former Research scientist (life sciences)  . Smokeless tobacco: Never Used  . Alcohol use No   Family History  Problem Relation Age of Onset  . Alcohol abuse Mother   . Diabetes Mother   . Heart disease Mother        ? MI CAD  . Cancer Father        lung CA  . Alcohol abuse Father   . Hypertension Sister   . Hyperlipidemia Sister   . Alcohol abuse Brother   . Colon polyps Brother    Allergies  Allergen Reactions  . Codeine     REACTION: Severe nausea and vomitnig   Current Outpatient Prescriptions on File Prior to Visit  Medication Sig Dispense Refill  . Calcium Carbonate-Vit D-Min 600-400 MG-UNIT TABS Take 1 tablet by mouth 2 (two) times daily.    . chlorpheniramine (CHLOR-TRIMETON) 4 MG tablet Take 1 tablet (4 mg total) by mouth 2 (two) times daily as needed for allergies.    . Dextromethorphan-Guaifenesin (ROBITUSSIN DM PO) Take by mouth as needed.    Marland Kitchen levothyroxine (SYNTHROID, LEVOTHROID) 125 MCG tablet TAKE 1 TABLET EVERY DAY 90 tablet 0   No current facility-administered medications on file prior to visit.      Review of Systems Review of Systems  Constitutional: Negative for fever, appetite change, fatigue and unexpected weight change.  Eyes: Negative for pain and visual disturbance.  Respiratory: Negative for cough and shortness of breath.   Cardiovascular: Negative for cp or palpitations    Gastrointestinal: Negative for nausea, diarrhea and constipation.  Genitourinary: Negative for urgency and frequency.  Skin: Negative for pallor or rash   Neurological: Negative for weakness, light-headedness, numbness and headaches.  Hematological: Negative for adenopathy. Does not bruise/bleed easily.  Psychiatric/Behavioral: Negative for dysphoric mood. The patient is not nervous/anxious.         Objective:   Physical Exam  Constitutional: She appears well-developed and well-nourished. No distress.  HENT:  Head: Normocephalic and atraumatic.  Mouth/Throat: Oropharynx is clear and moist.    Eyes: Pupils are equal, round, and reactive to light. Conjunctivae and EOM are normal.  Neck: Normal range of motion. Neck supple. No JVD present. Carotid bruit is not present. No thyromegaly present.  Cardiovascular: Normal rate, regular rhythm, normal heart sounds and intact distal pulses.  Exam reveals no gallop.   Pulmonary/Chest: Effort normal and breath sounds normal. No respiratory distress. She has no wheezes. She has no rales.  No crackles  Abdominal: Soft. Bowel sounds are normal. She exhibits no distension, no abdominal bruit and no mass. There is no tenderness.  Musculoskeletal: She exhibits no edema.  Lymphadenopathy:    She has no cervical adenopathy.  Neurological: She is alert. She has normal reflexes. She displays no tremor. No cranial nerve deficit. She exhibits normal muscle tone. Coordination normal.  Skin: Skin is warm and dry. No rash noted.  Psychiatric: She has a normal mood and affect.          Assessment & Plan:   Problem List Items Addressed This Visit      Endocrine   Hypothyroidism - Primary    TSH today  No missed doses /clinical changes or exam changes       Relevant Orders   TSH (Completed)     Other   Hyperlipidemia LDL goal <130    Disc goals for lipids and reasons to control them Rev labs with pt (last check) Diet is fair  Lipid panel today  Rev low sat fat diet in detail       Relevant Orders   Lipid panel (Completed)   Obesity    Discussed how this problem influences overall health and the risks it imposes  Reviewed plan for weight loss with lower calorie diet (via better food choices and also portion control or program like weight watchers) and exercise building up to or more than 30 minutes 5 days per week including some aerobic activity          Other Visit Diagnoses    Need for 23-polyvalent pneumococcal polysaccharide vaccine       Relevant Orders   Pneumococcal polysaccharide vaccine 23-valent greater than or equal to  2yo subcutaneous/IM (Completed)

## 2016-08-15 NOTE — Patient Instructions (Addendum)
Work on a healthy diet  Avoid red meat/ fried foods/ egg yolks/ fatty breakfast meats/ butter, cheese and high fat dairy/ and shellfish   For weight - eat less process carbohydrates and get your carbs from fruits and veggies  Keep walking   Pneumovax 23 today   Lab today   Follow up in 1 year for a wellness exam

## 2016-08-16 MED ORDER — LEVOTHYROXINE SODIUM 125 MCG PO TABS: 125.0000 ug | ORAL_TABLET | Freq: Every day | ORAL | 3 refills | Status: DC

## 2016-08-16 NOTE — Assessment & Plan Note (Signed)
Disc goals for lipids and reasons to control them Rev labs with pt (last check) Diet is fair  Lipid panel today  Rev low sat fat diet in detail

## 2016-08-16 NOTE — Assessment & Plan Note (Signed)
TSH today  No missed doses /clinical changes or exam changes

## 2016-08-16 NOTE — Assessment & Plan Note (Signed)
Discussed how this problem influences overall health and the risks it imposes  Reviewed plan for weight loss with lower calorie diet (via better food choices and also portion control or program like weight watchers) and exercise building up to or more than 30 minutes 5 days per week including some aerobic activity    

## 2016-08-16 NOTE — Addendum Note (Signed)
Addended by: Tammi Sou on: 08/16/2016 02:47 PM   Modules accepted: Orders

## 2016-08-28 DIAGNOSIS — H25813 Combined forms of age-related cataract, bilateral: Secondary | ICD-10-CM | POA: Diagnosis not present

## 2016-10-24 DIAGNOSIS — Z23 Encounter for immunization: Secondary | ICD-10-CM | POA: Diagnosis not present

## 2017-01-23 ENCOUNTER — Other Ambulatory Visit: Payer: Self-pay | Admitting: Family Medicine

## 2017-01-23 DIAGNOSIS — Z1231 Encounter for screening mammogram for malignant neoplasm of breast: Secondary | ICD-10-CM

## 2017-02-13 ENCOUNTER — Ambulatory Visit
Admission: RE | Admit: 2017-02-13 | Discharge: 2017-02-13 | Disposition: A | Discharge status: Home / Self Care | Payer: Medicare Other | Source: Ambulatory Visit | Attending: Family Medicine | Admitting: Family Medicine

## 2017-02-13 DIAGNOSIS — Z1231 Encounter for screening mammogram for malignant neoplasm of breast: Secondary | ICD-10-CM | POA: Diagnosis not present

## 2017-02-14 ENCOUNTER — Other Ambulatory Visit: Payer: Self-pay | Admitting: Family Medicine

## 2017-02-14 DIAGNOSIS — R928 Other abnormal and inconclusive findings on diagnostic imaging of breast: Secondary | ICD-10-CM

## 2017-02-20 ENCOUNTER — Other Ambulatory Visit: Payer: Self-pay | Admitting: Family Medicine

## 2017-02-20 ENCOUNTER — Ambulatory Visit
Admission: RE | Admit: 2017-02-20 | Discharge: 2017-02-20 | Disposition: A | Discharge status: Home / Self Care | Payer: Medicare Other | Source: Ambulatory Visit | Attending: Family Medicine | Admitting: Family Medicine

## 2017-02-20 DIAGNOSIS — R921 Mammographic calcification found on diagnostic imaging of breast: Secondary | ICD-10-CM

## 2017-02-20 DIAGNOSIS — R928 Other abnormal and inconclusive findings on diagnostic imaging of breast: Secondary | ICD-10-CM

## 2017-10-29 ENCOUNTER — Ambulatory Visit (INDEPENDENT_AMBULATORY_CARE_PROVIDER_SITE_OTHER): Payer: Medicare Other

## 2017-10-29 VITALS — BP 140/84 | HR 80 | Temp 98.1°F | Ht 66.5 in | Wt 195.5 lb

## 2017-10-29 DIAGNOSIS — Z23 Encounter for immunization: Secondary | ICD-10-CM | POA: Diagnosis not present

## 2017-10-29 DIAGNOSIS — E785 Hyperlipidemia, unspecified: Secondary | ICD-10-CM | POA: Diagnosis not present

## 2017-10-29 DIAGNOSIS — Z0000 Encounter for general adult medical examination without abnormal findings: Secondary | ICD-10-CM | POA: Diagnosis not present

## 2017-10-29 DIAGNOSIS — E038 Other specified hypothyroidism: Secondary | ICD-10-CM | POA: Diagnosis not present

## 2017-10-29 DIAGNOSIS — Z1159 Encounter for screening for other viral diseases: Secondary | ICD-10-CM | POA: Diagnosis not present

## 2017-10-29 LAB — CBC WITH DIFFERENTIAL/PLATELET
Basophils Absolute: 0 10*3/uL (ref 0.0–0.1)
Basophils Relative: 0.7 % (ref 0.0–3.0)
Eosinophils Absolute: 0.1 10*3/uL (ref 0.0–0.7)
Eosinophils Relative: 1.8 % (ref 0.0–5.0)
HCT: 42.1 % (ref 36.0–46.0)
Hemoglobin: 14 g/dL (ref 12.0–15.0)
Lymphocytes Relative: 24.6 % (ref 12.0–46.0)
Lymphs Abs: 1.6 10*3/uL (ref 0.7–4.0)
MCHC: 33.3 g/dL (ref 30.0–36.0)
MCV: 85.4 fl (ref 78.0–100.0)
Monocytes Absolute: 0.5 10*3/uL (ref 0.1–1.0)
Monocytes Relative: 7.8 % (ref 3.0–12.0)
Neutro Abs: 4.2 10*3/uL (ref 1.4–7.7)
Neutrophils Relative %: 65.1 % (ref 43.0–77.0)
Platelets: 305 10*3/uL (ref 150.0–400.0)
RBC: 4.93 Mil/uL (ref 3.87–5.11)
RDW: 13.7 % (ref 11.5–15.5)
WBC: 6.4 10*3/uL (ref 4.0–10.5)

## 2017-10-29 LAB — COMPREHENSIVE METABOLIC PANEL
ALT: 19 U/L (ref 0–35)
AST: 16 U/L (ref 0–37)
Albumin: 4.5 g/dL (ref 3.5–5.2)
Alkaline Phosphatase: 51 U/L (ref 39–117)
BUN: 20 mg/dL (ref 6–23)
CO2: 26 mEq/L (ref 19–32)
Calcium: 9.7 mg/dL (ref 8.4–10.5)
Chloride: 103 mEq/L (ref 96–112)
Creatinine, Ser: 0.58 mg/dL (ref 0.40–1.20)
GFR: 109.63 mL/min (ref >60.00)
Glucose, Bld: 105 mg/dL — ABNORMAL HIGH (ref 70–99)
Potassium: 4.1 mEq/L (ref 3.5–5.1)
Sodium: 138 mEq/L (ref 135–145)
Total Bilirubin: 0.5 mg/dL (ref 0.2–1.2)
Total Protein: 7.3 g/dL (ref 6.0–8.3)

## 2017-10-29 LAB — LIPID PANEL
Cholesterol: 197 mg/dL (ref 0–200)
HDL: 46.7 mg/dL (ref >39.00)
NonHDL: 150.35
Total CHOL/HDL Ratio: 4
Triglycerides: 261 mg/dL — ABNORMAL HIGH (ref 0.0–149.0)
VLDL: 52.2 mg/dL — ABNORMAL HIGH (ref 0.0–40.0)

## 2017-10-29 LAB — LDL CHOLESTEROL, DIRECT: Direct LDL: 111 mg/dL

## 2017-10-29 LAB — TSH: TSH: 2.96 u[IU]/mL (ref 0.35–4.50)

## 2017-10-29 NOTE — Progress Notes (Signed)
PCP notes:   Health maintenance:  Flu vaccine - administered Hep C screening - completed  Abnormal screenings:   Hearing - failed  Hearing Screening   125Hz  250Hz  500Hz  1000Hz  2000Hz  3000Hz  4000Hz  6000Hz  8000Hz   Right ear:   40 40 40  40    Left ear:   40 40 40  0    Vision Screening Comments: Vision exam in 2018 with Dr. Lorie Apley   Patient concerns:   None  Nurse concerns:  None  Next PCP appt:   11/04/2017 @ 0830  I reviewed health advisor's note, was available for consultation, and agree with documentation and plan. Loura Pardon MD

## 2017-10-29 NOTE — Patient Instructions (Signed)
Ms. Daye , Thank you for taking time to come for your Medicare Wellness Visit. I appreciate your ongoing commitment to your health goals. Please review the following plan we discussed and let me know if I can assist you in the future.   These are the goals we discussed: Goals    . Increase physical activity     Starting 10/29/2017, I will continue to walk at 30 minutes 2-3 days a week and to do strength training with hand weights intermittently.        This is a list of the screening recommended for you and due dates:  Health Maintenance  Topic Date Due  . Mammogram  02/20/2018  . Tetanus Vaccine  01/28/2021  . Colon Cancer Screening  04/01/2021  . Flu Shot  Completed  . DEXA scan (bone density measurement)  Completed  .  Hepatitis C: One time screening is recommended by Center for Disease Control  (CDC) for  adults born from [birth year] through 1966.   Completed  . Pneumonia vaccines  Completed   Preventive Care for Adults  A healthy lifestyle and preventive care can promote health and wellness. Preventive health guidelines for adults include the following key practices.  . A routine yearly physical is a good way to check with your health care provider about your health and preventive screening. It is a chance to share any concerns and updates on your health and to receive a thorough exam.  . Visit your dentist for a routine exam and preventive care every 6 months. Brush your teeth twice a day and floss once a day. Good oral hygiene prevents tooth decay and gum disease.  . The frequency of eye exams is based on your age, health, family medical history, use  of contact lenses, and other factors. Follow your health care provider's recommendations for frequency of eye exams.  . Eat a healthy diet. Foods like vegetables, fruits, whole grains, low-fat dairy products, and lean protein foods contain the nutrients you need without too many calories. Decrease your intake of foods high in solid  fats, added sugars, and salt. Eat the right amount of calories for you. Get information about a proper diet from your health care provider, if necessary.  . Regular physical exercise is one of the most important things you can do for your health. Most adults should get at least 150 minutes of moderate-intensity exercise (any activity that increases your heart rate and causes you to sweat) each week. In addition, most adults need muscle-strengthening exercises on 2 or more days a week.  Silver Sneakers may be a benefit available to you. To determine eligibility, you may visit the website: www.silversneakers.com or contact program at 938-814-1371 Mon-Fri between 8AM-8PM.   . Maintain a healthy weight. The body mass index (BMI) is a screening tool to identify possible weight problems. It provides an estimate of body fat based on height and weight. Your health care provider can find your BMI and can help you achieve or maintain a healthy weight.   For adults 20 years and older: ? A BMI below 18.5 is considered underweight. ? A BMI of 18.5 to 24.9 is normal. ? A BMI of 25 to 29.9 is considered overweight. ? A BMI of 30 and above is considered obese.   . Maintain normal blood lipids and cholesterol levels by exercising and minimizing your intake of saturated fat. Eat a balanced diet with plenty of fruit and vegetables. Blood tests for lipids and cholesterol should begin  at age 21 and be repeated every 5 years. If your lipid or cholesterol levels are high, you are over 50, or you are at high risk for heart disease, you may need your cholesterol levels checked more frequently. Ongoing high lipid and cholesterol levels should be treated with medicines if diet and exercise are not working.  . If you smoke, find out from your health care provider how to quit. If you do not use tobacco, please do not start.  . If you choose to drink alcohol, please do not consume more than 2 drinks per day. One drink is  considered to be 12 ounces (355 mL) of beer, 5 ounces (148 mL) of wine, or 1.5 ounces (44 mL) of liquor.  . If you are 78-55 years old, ask your health care provider if you should take aspirin to prevent strokes.  . Use sunscreen. Apply sunscreen liberally and repeatedly throughout the day. You should seek shade when your shadow is shorter than you. Protect yourself by wearing long sleeves, pants, a wide-brimmed hat, and sunglasses year round, whenever you are outdoors.  . Once a month, do a whole body skin exam, using a mirror to look at the skin on your back. Tell your health care provider of new moles, moles that have irregular borders, moles that are larger than a pencil eraser, or moles that have changed in shape or color.

## 2017-10-29 NOTE — Progress Notes (Signed)
Subjective:   Robin Gilmore is a 67 y.o. female who presents for Medicare Annual (Subsequent) preventive examination.  Review of Systems:  N/A Cardiac Risk Factors include: advanced age (>53men, >36 women);dyslipidemia;obesity (BMI >30kg/m2)     Objective:     Vitals: BP 140/84 (BP Location: Right Arm, Patient Position: Sitting, Cuff Size: Normal)   Pulse 80   Temp 98.1 F (36.7 C) (Oral)   Ht 5' 6.5" (1.689 m) Comment: no shoes  Wt 195 lb 8 oz (88.7 kg)   SpO2 97%   BMI 31.08 kg/m   Body mass index is 31.08 kg/m.  Advanced Directives 10/29/2017 08/23/2015  Does Patient Have a Medical Advance Directive? No No  Would patient like information on creating a medical advance directive? No - Patient declined Yes - Scientist, clinical (histocompatibility and immunogenetics) given    Tobacco Social History   Tobacco Use  Smoking Status Former Smoker  Smokeless Tobacco Never Used     Counseling given: No   Clinical Intake:  Pre-visit preparation completed: Yes  Pain Score: 1      Nutritional Status: BMI > 30  Obese Nutritional Risks: None Diabetes: No  How often do you need to have someone help you when you read instructions, pamphlets, or other written materials from your doctor or pharmacy?: 1 - Never What is the last grade level you completed in school?: 12th grade + 2 yrs cosmetology school  Interpreter Needed?: No  Comments: pt lives with spouse Information entered by :: LPinson,LPN  Past Medical History:  Diagnosis Date  . Constipation   . Hyperlipidemia   . Hypothyroid   . IBS (irritable bowel syndrome)   . Menopausal syndrome   . Mitral prolapse and regurge used to have prophylaxis  . Obesity    Past Surgical History:  Procedure Laterality Date  . BREAST EXCISIONAL BIOPSY Right   . BREAST SURGERY     breast biopsy x 2 benign  . CESAREAN SECTION    . retrovaginal fistula repair     Family History  Problem Relation Age of Onset  . Alcohol abuse Mother   . Diabetes Mother     . Heart disease Mother        ? MI CAD  . Cancer Father        lung CA  . Alcohol abuse Father   . Hypertension Sister   . Hyperlipidemia Sister   . Alcohol abuse Brother   . Colon polyps Brother    Social History   Socioeconomic History  . Marital status: Single    Spouse name: Not on file  . Number of children: Not on file  . Years of education: Not on file  . Highest education level: Not on file  Occupational History  . Not on file  Social Needs  . Financial resource strain: Not on file  . Food insecurity:    Worry: Not on file    Inability: Not on file  . Transportation needs:    Medical: Not on file    Non-medical: Not on file  Tobacco Use  . Smoking status: Former Research scientist (life sciences)  . Smokeless tobacco: Never Used  Substance and Sexual Activity  . Alcohol use: No    Alcohol/week: 0.0 standard drinks  . Drug use: No  . Sexual activity: Never  Lifestyle  . Physical activity:    Days per week: Not on file    Minutes per session: Not on file  . Stress: Not on file  Relationships  .  Social connections:    Talks on phone: Not on file    Gets together: Not on file    Attends religious service: Not on file    Active member of club or organization: Not on file    Attends meetings of clubs or organizations: Not on file    Relationship status: Not on file  Other Topics Concern  . Not on file  Social History Narrative  . Not on file    Outpatient Encounter Medications as of 10/29/2017  Medication Sig  . albuterol (PROVENTIL HFA;VENTOLIN HFA) 108 (90 Base) MCG/ACT inhaler Inhale 2 puffs into the lungs every 6 (six) hours as needed for wheezing or shortness of breath.  . Calcium Carbonate-Vit D-Min 600-400 MG-UNIT TABS Take 1 tablet by mouth 2 (two) times daily.  . chlorpheniramine (CHLOR-TRIMETON) 4 MG tablet Take 1 tablet (4 mg total) by mouth 2 (two) times daily as needed for allergies.  . Dextromethorphan-Guaifenesin (ROBITUSSIN DM PO) Take by mouth as needed.  Marland Kitchen  levothyroxine (SYNTHROID, LEVOTHROID) 125 MCG tablet Take 1 tablet (125 mcg total) by mouth daily.   No facility-administered encounter medications on file as of 10/29/2017.     Activities of Daily Living In your present state of health, do you have any difficulty performing the following activities: 10/29/2017  Hearing? N  Vision? N  Difficulty concentrating or making decisions? N  Walking or climbing stairs? N  Dressing or bathing? N  Doing errands, shopping? N  Preparing Food and eating ? N  Using the Toilet? N  In the past six months, have you accidently leaked urine? N  Do you have problems with loss of bowel control? N  Managing your Medications? N  Managing your Finances? N  Housekeeping or managing your Housekeeping? N  Some recent data might be hidden    Patient Care Team: Tower, Wynelle Fanny, MD as PCP - General    Assessment:   This is a routine wellness examination for Keeli.   Hearing Screening   125Hz  250Hz  500Hz  1000Hz  2000Hz  3000Hz  4000Hz  6000Hz  8000Hz   Right ear:   40 40 40  40    Left ear:   40 40 40  0    Vision Screening Comments: Vision exam in 2018 with Dr. Lorie Apley   Exercise Activities and Dietary recommendations Current Exercise Habits: Home exercise routine, Type of exercise: strength training/weights;walking, Time (Minutes): 30, Frequency (Times/Week): 3, Weekly Exercise (Minutes/Week): 90, Intensity: Mild, Exercise limited by: None identified  Goals    . Increase physical activity     Starting 10/29/2017, I will continue to walk at 30 minutes 2-3 days a week and to do strength training with hand weights intermittently.        Fall Risk Fall Risk  10/29/2017 08/23/2015  Falls in the past year? No Yes  Comment - fell over pet on floor  Number falls in past yr: - 1  Injury with Fall? - No  Follow up - Falls evaluation completed   Depression Screen PHQ 2/9 Scores 10/29/2017 08/15/2016 08/23/2015 06/30/2012  PHQ - 2 Score 0 0 0 0  PHQ- 9 Score  0 3 - -     Cognitive Function MMSE - Mini Mental State Exam 10/29/2017 08/23/2015  Orientation to time 5 5  Orientation to Place 5 5  Registration 3 3  Attention/ Calculation 0 0  Recall 3 3  Language- name 2 objects 0 0  Language- repeat 1 1  Language- follow 3 step command 3 3  Language- read & follow direction 0 0  Write a sentence 0 0  Copy design 0 0  Total score 20 20     PLEASE NOTE: A Mini-Cog screen was completed. Maximum score is 20. A value of 0 denotes this part of Folstein MMSE was not completed or the patient failed this part of the Mini-Cog screening.   Mini-Cog Screening Orientation to Time - Max 5 pts Orientation to Place - Max 5 pts Registration - Max 3 pts Recall - Max 3 pts Language Repeat - Max 1 pts Language Follow 3 Step Command - Max 3 pts     Immunization History  Administered Date(s) Administered  . Influenza Split 01/29/2011  . Influenza Whole 11/21/2009  . Influenza, High Dose Seasonal PF 10/24/2016, 10/29/2017  . Influenza,inj,Quad PF,6+ Mos 11/18/2013  . Influenza-Unspecified 11/12/2012  . Pneumococcal Conjugate-13 08/09/2015  . Pneumococcal Polysaccharide-23 08/15/2016  . Td 11/15/2001  . Tdap 01/29/2011  . Zoster 11/22/2012   Screening Tests Health Maintenance  Topic Date Due  . MAMMOGRAM  02/20/2018  . TETANUS/TDAP  01/28/2021  . COLONOSCOPY  04/01/2021  . INFLUENZA VACCINE  Completed  . DEXA SCAN  Completed  . Hepatitis C Screening  Completed  . PNA vac Low Risk Adult  Completed       Plan:   I have personally reviewed, addressed, and noted the following in the patient's chart:  A. Medical and social history B. Use of alcohol, tobacco or illicit drugs  C. Current medications and supplements D. Functional ability and status E.  Nutritional status F.  Physical activity G. Advance directives H. List of other physicians I.  Hospitalizations, surgeries, and ER visits in previous 12 months J.  [place] to  include hearing, vision, cognitive, depression L. Referrals and appointments - none  In addition, I have reviewed and discussed with patient certain preventive protocols, quality metrics, and best practice recommendations. A written personalized care plan for preventive services as well as general preventive health recommendations were provided to patient.  See attached scanned questionnaire for additional information.   Signed,   Lindell Noe, MHA, BS, LPN Health Coach

## 2017-10-30 LAB — HEPATITIS C ANTIBODY
Hepatitis C Ab: NONREACTIVE
SIGNAL TO CUT-OFF: 0.1 (ref <1.00)

## 2017-11-04 ENCOUNTER — Encounter: Payer: Self-pay | Admitting: Family Medicine

## 2017-11-04 ENCOUNTER — Ambulatory Visit (INDEPENDENT_AMBULATORY_CARE_PROVIDER_SITE_OTHER): Payer: Medicare Other | Admitting: Family Medicine

## 2017-11-04 VITALS — BP 130/73 | HR 65 | Temp 97.8°F | Ht 66.5 in | Wt 195.5 lb

## 2017-11-04 DIAGNOSIS — E6609 Other obesity due to excess calories: Secondary | ICD-10-CM | POA: Diagnosis not present

## 2017-11-04 DIAGNOSIS — I341 Nonrheumatic mitral (valve) prolapse: Secondary | ICD-10-CM | POA: Diagnosis not present

## 2017-11-04 DIAGNOSIS — Z6831 Body mass index (BMI) 31.0-31.9, adult: Secondary | ICD-10-CM | POA: Diagnosis not present

## 2017-11-04 DIAGNOSIS — E785 Hyperlipidemia, unspecified: Secondary | ICD-10-CM

## 2017-11-04 DIAGNOSIS — E66811 Obesity, class 1: Secondary | ICD-10-CM

## 2017-11-04 DIAGNOSIS — E039 Hypothyroidism, unspecified: Secondary | ICD-10-CM | POA: Diagnosis not present

## 2017-11-04 MED ORDER — LEVOTHYROXINE SODIUM 125 MCG PO TABS: 125.0000 ug | ORAL_TABLET | Freq: Every day | ORAL | 3 refills | Status: DC

## 2017-11-04 NOTE — Progress Notes (Signed)
Subjective:    Patient ID: Robin Gilmore, female    DOB: 03-24-1950, 67 y.o.   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 Feeling ok   Wt Readings from Last 3 Encounters:  11/04/17 195 lb 8 oz (88.7 kg)  10/29/17 195 lb 8 oz (88.7 kg)  08/15/16 202 lb 8 oz (91.9 kg)  lost some weight  Last 2 mo- avoiding breads and sweets more- and feels better  More protein and less carbs  Avoiding sweet tea  Exercise- walking at least 3 times per week and hand weights  31.08 kg/m   Had amw on 10/15 Missed high tone in L ear on hearing screen (she notices a little) Flu shot given   Mammogram 2/19 neg Self breast exam -no breast lumps   Colonoscopy 3/13 with 10 y recall   dexa 10/17- bmd in nl range No falls or fx  Taking ca and D   zostavax 11/04  BP BP Readings from Last 3 Encounters:  11/04/17 (!) 144/84  10/29/17 140/84  08/15/16 140/78  borderline bp Better on 2nd check BP: 130/73   Sister has HTN   Pulse Readings from Last 3 Encounters:  11/04/17 65  10/29/17 80  08/15/16 78   Hyperlipidemia Lab Results  Component Value Date   CHOL 197 10/29/2017   CHOL 186 08/15/2016   CHOL 176 08/02/2015   Lab Results  Component Value Date   HDL 46.70 10/29/2017   HDL 42.30 08/15/2016   HDL 45.60 08/02/2015   Lab Results  Component Value Date   LDLCALC 98 08/02/2015   LDLCALC 113 (H) 12/04/2010   LDLCALC 95 11/21/2009   Lab Results  Component Value Date   TRIG 261.0 (H) 10/29/2017   TRIG 390.0 (H) 08/15/2016   TRIG 162.0 (H) 08/02/2015   Lab Results  Component Value Date   CHOLHDL 4 10/29/2017   CHOLHDL 4 08/15/2016   CHOLHDL 4 08/02/2015   Lab Results  Component Value Date   LDLDIRECT 111.0 10/29/2017   LDLDIRECT 82.0 08/15/2016   LDLDIRECT 95.1 06/23/2012   better diet  Some improvement  HDL still in the 40s   Glucose fasting 105 (non fasting)  Lab Results    Component Value Date   CREATININE 0.58 10/29/2017   BUN 20 10/29/2017   NA 138 10/29/2017   K 4.1 10/29/2017   CL 103 10/29/2017   CO2 26 10/29/2017   Lab Results  Component Value Date   ALT 19 10/29/2017   AST 16 10/29/2017   ALKPHOS 51 10/29/2017   BILITOT 0.5 10/29/2017   Lab Results  Component Value Date   WBC 6.4 10/29/2017   HGB 14.0 10/29/2017   HCT 42.1 10/29/2017   MCV 85.4 10/29/2017   PLT 305.0 10/29/2017   Hep C screen neg   Hypothyroidism  Pt has no clinical changes No change in energy level/ hair or skin/ edema and no tremor Lab Results  Component Value Date   TSH 2.96 10/29/2017       Review of Systems  Constitutional: Negative for activity change, appetite change, fatigue, fever and unexpected weight change.  HENT: Negative for congestion, ear pain, rhinorrhea, sinus pressure and sore throat.   Eyes: Negative for pain, redness and visual disturbance.  Respiratory: Negative for cough, shortness of breath and wheezing.   Cardiovascular: Negative for chest pain and palpitations.  Gastrointestinal: Negative  for abdominal pain, blood in stool, constipation and diarrhea.  Endocrine: Negative for polydipsia and polyuria.  Genitourinary: Negative for dysuria, frequency and urgency.  Musculoskeletal: Negative for arthralgias, back pain and myalgias.       Some aches and pains with age   Skin: Negative for pallor and rash.  Allergic/Immunologic: Negative for environmental allergies.  Neurological: Negative for dizziness, syncope and headaches.  Hematological: Negative for adenopathy. Does not bruise/bleed easily.  Psychiatric/Behavioral: Negative for decreased concentration and dysphoric mood. The patient is not nervous/anxious.        Objective:   Physical Exam  Constitutional: She appears well-developed and well-nourished. No distress.  obese and well appearing   HENT:  Head: Normocephalic and atraumatic.  Right Ear: External ear normal.  Left  Ear: External ear normal.  Mouth/Throat: Oropharynx is clear and moist.  Mild cerumen in R ear canal  Eyes: Pupils are equal, round, and reactive to light. Conjunctivae and EOM are normal. No scleral icterus.  Neck: Normal range of motion. Neck supple. No JVD present. Carotid bruit is not present. No thyromegaly present.  Cardiovascular: Normal rate, regular rhythm and intact distal pulses. Exam reveals no gallop.  Murmur heard. Baseline systolic M  Pulmonary/Chest: Effort normal and breath sounds normal. No respiratory distress. She has no wheezes. She has no rales. She exhibits no tenderness. No breast tenderness, discharge or bleeding.  Abdominal: Soft. Bowel sounds are normal. She exhibits no distension, no abdominal bruit and no mass. There is no tenderness.  Genitourinary:  Genitourinary Comments: Breast exam: No mass, nodules, thickening, tenderness, bulging, retraction, inflamation, nipple discharge or skin changes noted.  No axillary or clavicular LA.      Musculoskeletal: Normal range of motion. She exhibits no edema or tenderness.  Lymphadenopathy:    She has no cervical adenopathy.  Neurological: She is alert. She has normal reflexes. She displays normal reflexes. No cranial nerve deficit. She exhibits normal muscle tone. Coordination normal.  Skin: Skin is warm and dry. No rash noted. No erythema. No pallor.  SKs and lentigines scattered on trunk  Psychiatric: She has a normal mood and affect.  Pleasant           Assessment & Plan:   Problem List Items Addressed This Visit      Cardiovascular and Mediastinum   Mitral valve prolapse    Pt takes amox for dental visits- px from dentist         Endocrine   Hypothyroidism - Primary    Hypothyroidism  Pt has no clinical changes No change in energy level/ hair or skin/ edema and no tremor Lab Results  Component Value Date   TSH 2.96 10/29/2017           Relevant Medications   levothyroxine (SYNTHROID,  LEVOTHROID) 125 MCG tablet     Other   Hyperlipidemia LDL goal <130    Disc goals for lipids and reasons to control them Rev last labs with pt Rev low sat fat diet in detail Some imp in LDL and trig with better diet Enc to exercise for HDL      Obesity    Discussed how this problem influences overall health and the risks it imposes  Reviewed plan for weight loss with lower calorie diet (via better food choices and also portion control or program like weight watchers) and exercise building up to or more than 30 minutes 5 days per week including some aerobic activity

## 2017-11-04 NOTE — Assessment & Plan Note (Signed)
Hypothyroidism  Pt has no clinical changes No change in energy level/ hair or skin/ edema and no tremor Lab Results  Component Value Date   TSH 2.96 10/29/2017

## 2017-11-04 NOTE — Patient Instructions (Addendum)
Try to get most of your carbohydrates from produce (with the exception of white potatoes)  Eat less bread/pasta/rice/snack foods/cereals/sweets and other items from the middle of the grocery store (processed carbs)  Labs look good Keep walking   You have ear wax on the R  Debrox or peroxide -as directed will help loosen it   Take care of yourself

## 2017-11-04 NOTE — Assessment & Plan Note (Signed)
Discussed how this problem influences overall health and the risks it imposes  Reviewed plan for weight loss with lower calorie diet (via better food choices and also portion control or program like weight watchers) and exercise building up to or more than 30 minutes 5 days per week including some aerobic activity    

## 2017-11-04 NOTE — Assessment & Plan Note (Signed)
Disc goals for lipids and reasons to control them Rev last labs with pt Rev low sat fat diet in detail Some imp in LDL and trig with better diet Enc to exercise for HDL

## 2017-11-04 NOTE — Assessment & Plan Note (Signed)
Pt takes amox for dental visits- px from dentist

## 2018-03-18 ENCOUNTER — Other Ambulatory Visit: Payer: Self-pay | Admitting: Family Medicine

## 2018-03-18 DIAGNOSIS — Z1231 Encounter for screening mammogram for malignant neoplasm of breast: Secondary | ICD-10-CM

## 2018-03-26 DIAGNOSIS — H25813 Combined forms of age-related cataract, bilateral: Secondary | ICD-10-CM | POA: Diagnosis not present

## 2018-04-16 ENCOUNTER — Encounter: Payer: Self-pay | Admitting: *Deleted

## 2018-04-16 ENCOUNTER — Ambulatory Visit: Payer: Medicare Other

## 2018-06-06 ENCOUNTER — Other Ambulatory Visit: Payer: Self-pay | Admitting: *Deleted

## 2018-06-06 NOTE — Patient Outreach (Signed)
[facility] Care Management  06/06/2018  Robin Gilmore [birth year] [id number]  Follow up from HRA screening phone callattempts placed to patient on 04/11/2018 and 04/16/2018- both calls were unsuccessful and patientwas mailed unsuccessful letter on 04/16/2018 without patient call-back to date.  Patientto be followed in [facility] engaged program.   Plan:  Will re-attempt call to patient within 2 months of initial unsuccessful attempts previously made  Oneta Rack, RN, BSN, [place] Care Management  [phone]

## 2018-06-12 ENCOUNTER — Ambulatory Visit: Payer: Self-pay | Admitting: *Deleted

## 2018-06-18 ENCOUNTER — Ambulatory Visit: Payer: Self-pay | Admitting: *Deleted

## 2018-07-01 ENCOUNTER — Other Ambulatory Visit: Payer: Self-pay | Admitting: *Deleted

## 2018-07-01 NOTE — Patient Outreach (Signed)
[facility]) Care Management [facility] CM Case Closure note Patient active with external program 07/01/2018  MAHATHI POKORNEY February 13, 1950 [id number]  [facility] Care Management Case Closure note from previously placed [facility] HRA screening phone call  Received confirmation from [facility] team that patient is now active with an external program  Plan:  Will close patient case and make patient inactive with [facility] CM  Oneta Rack, RN, BSN, [place] Coordinator [facility] Care Management  [phone]

## 2018-09-03 ENCOUNTER — Other Ambulatory Visit: Payer: Self-pay

## 2018-09-03 ENCOUNTER — Ambulatory Visit
Admission: RE | Admit: 2018-09-03 | Discharge: 2018-09-03 | Disposition: A | Discharge status: Home / Self Care | Payer: PPO | Source: Ambulatory Visit | Attending: Family Medicine | Admitting: Family Medicine

## 2018-09-03 DIAGNOSIS — Z1231 Encounter for screening mammogram for malignant neoplasm of breast: Secondary | ICD-10-CM | POA: Diagnosis not present

## 2018-10-07 DIAGNOSIS — H40003 Preglaucoma, unspecified, bilateral: Secondary | ICD-10-CM | POA: Diagnosis not present

## 2018-11-02 ENCOUNTER — Telehealth: Payer: Self-pay | Admitting: Family Medicine

## 2018-11-02 DIAGNOSIS — E039 Hypothyroidism, unspecified: Secondary | ICD-10-CM

## 2018-11-02 DIAGNOSIS — E785 Hyperlipidemia, unspecified: Secondary | ICD-10-CM

## 2018-11-02 DIAGNOSIS — Z0000 Encounter for general adult medical examination without abnormal findings: Secondary | ICD-10-CM

## 2018-11-02 NOTE — Telephone Encounter (Signed)
-----   Message from Cloyd Stagers, RT sent at 10/29/2018  3:36 PM EDT ----- Regarding: Lab Orders for Monday 10.19.2020 Please place lab orders for Monday 10.19.2020, office visit for physical on Monday 10.26.2020 Thank you, Dyke Maes RT(R)

## 2018-11-03 ENCOUNTER — Other Ambulatory Visit (INDEPENDENT_AMBULATORY_CARE_PROVIDER_SITE_OTHER): Payer: PPO

## 2018-11-03 ENCOUNTER — Ambulatory Visit: Payer: Medicare Other

## 2018-11-03 ENCOUNTER — Ambulatory Visit (INDEPENDENT_AMBULATORY_CARE_PROVIDER_SITE_OTHER): Payer: PPO

## 2018-11-03 ENCOUNTER — Other Ambulatory Visit: Payer: Self-pay

## 2018-11-03 DIAGNOSIS — E785 Hyperlipidemia, unspecified: Secondary | ICD-10-CM

## 2018-11-03 DIAGNOSIS — Z0000 Encounter for general adult medical examination without abnormal findings: Secondary | ICD-10-CM

## 2018-11-03 DIAGNOSIS — E039 Hypothyroidism, unspecified: Secondary | ICD-10-CM

## 2018-11-03 LAB — COMPREHENSIVE METABOLIC PANEL
ALT: 22 U/L (ref 0–35)
AST: 18 U/L (ref 0–37)
Albumin: 4.6 g/dL (ref 3.5–5.2)
Alkaline Phosphatase: 56 U/L (ref 39–117)
BUN: 16 mg/dL (ref 6–23)
CO2: 27 mEq/L (ref 19–32)
Calcium: 9.7 mg/dL (ref 8.4–10.5)
Chloride: 101 mEq/L (ref 96–112)
Creatinine, Ser: 0.6 mg/dL (ref 0.40–1.20)
GFR: 98.9 mL/min (ref >60.00)
Glucose, Bld: 103 mg/dL — ABNORMAL HIGH (ref 70–99)
Potassium: 4.1 mEq/L (ref 3.5–5.1)
Sodium: 138 mEq/L (ref 135–145)
Total Bilirubin: 0.6 mg/dL (ref 0.2–1.2)
Total Protein: 7.3 g/dL (ref 6.0–8.3)

## 2018-11-03 LAB — CBC WITH DIFFERENTIAL/PLATELET
Basophils Absolute: 0.2 10*3/uL — ABNORMAL HIGH (ref 0.0–0.1)
Basophils Relative: 2.9 % (ref 0.0–3.0)
Eosinophils Absolute: 0.2 10*3/uL (ref 0.0–0.7)
Eosinophils Relative: 2.2 % (ref 0.0–5.0)
HCT: 42.5 % (ref 36.0–46.0)
Hemoglobin: 14.3 g/dL (ref 12.0–15.0)
Lymphocytes Relative: 23.9 % (ref 12.0–46.0)
Lymphs Abs: 1.7 10*3/uL (ref 0.7–4.0)
MCHC: 33.6 g/dL (ref 30.0–36.0)
MCV: 85.9 fl (ref 78.0–100.0)
Monocytes Absolute: 0.6 10*3/uL (ref 0.1–1.0)
Monocytes Relative: 8.9 % (ref 3.0–12.0)
Neutro Abs: 4.5 10*3/uL (ref 1.4–7.7)
Neutrophils Relative %: 62.1 % (ref 43.0–77.0)
Platelets: 307 10*3/uL (ref 150.0–400.0)
RBC: 4.95 Mil/uL (ref 3.87–5.11)
RDW: 13.8 % (ref 11.5–15.5)
WBC: 7.2 10*3/uL (ref 4.0–10.5)

## 2018-11-03 LAB — LIPID PANEL
Cholesterol: 193 mg/dL (ref 0–200)
HDL: 47 mg/dL (ref >39.00)
NonHDL: 146.38
Total CHOL/HDL Ratio: 4
Triglycerides: 234 mg/dL — ABNORMAL HIGH (ref 0.0–149.0)
VLDL: 46.8 mg/dL — ABNORMAL HIGH (ref 0.0–40.0)

## 2018-11-03 LAB — TSH: TSH: 5.5 u[IU]/mL — ABNORMAL HIGH (ref 0.35–4.50)

## 2018-11-03 LAB — LDL CHOLESTEROL, DIRECT: Direct LDL: 90 mg/dL

## 2018-11-03 NOTE — Progress Notes (Signed)
PCP notes:   Health Maintenance: Needs flu vaccine, will check with pharmacy about receiving Shingrix vaccine    Abnormal Screenings: none    Patient concerns: none    Nurse concerns: none    Next PCP appt.: 11/10/2018 @ 8:30 am

## 2018-11-03 NOTE — Patient Instructions (Signed)
Ms. Robin Gilmore , Thank you for taking time to come for your Medicare Wellness Visit. I appreciate your ongoing commitment to your health goals. Please review the following plan we discussed and let me know if I can assist you in the future.   Screening recommendations/referrals: Colonoscopy: up to date, completed 04/02/2011 Mammogram: up to date, completed 09/03/2018 Bone Density: up to date, completed 11/14/2015 Recommended yearly ophthalmology/optometry visit for glaucoma screening and checkup Recommended yearly dental visit for hygiene and checkup  Vaccinations: Influenza vaccine: will get flu shot at office visit  Pneumococcal vaccine: Completed series Tdap vaccine: up to date, completed  Shingles vaccine: will check with pharmacy    Advanced directives: Advance directive discussed with you today. Even though you declined this today please call our office should you change your mind and we can give you the proper paperwork for you to fill out.  Conditions/risks identified: hyperlipidemia  Next appointment: 11/10/2018 @ 8:30 am    Preventive Care 65 Years and Older, Female Preventive care refers to lifestyle choices and visits with your health care provider that can promote health and wellness. What does preventive care include?  A yearly physical exam. This is also called an annual well check.  Dental exams once or twice a year.  Routine eye exams. Ask your health care provider how often you should have your eyes checked.  Personal lifestyle choices, including:  Daily care of your teeth and gums.  Regular physical activity.  Eating a healthy diet.  Avoiding tobacco and drug use.  Limiting alcohol use.  Practicing safe sex.  Taking low-dose aspirin every day.  Taking vitamin and mineral supplements as recommended by your health care provider. What happens during an annual well check? The services and screenings done by your health care provider during your annual  well check will depend on your age, overall health, lifestyle risk factors, and family history of disease. Counseling  Your health care provider may ask you questions about your:  Alcohol use.  Tobacco use.  Drug use.  Emotional well-being.  Home and relationship well-being.  Sexual activity.  Eating habits.  History of falls.  Memory and ability to understand (cognition).  Work and work Statistician.  Reproductive health. Screening  You may have the following tests or measurements:  Height, weight, and BMI.  Blood pressure.  Lipid and cholesterol levels. These may be checked every 5 years, or more frequently if you are over 24 years old.  Skin check.  Lung cancer screening. You may have this screening every year starting at age 9 if you have a 30-pack-year history of smoking and currently smoke or have quit within the past 15 years.  Fecal occult blood test (FOBT) of the stool. You may have this test every year starting at age 9.  Flexible sigmoidoscopy or colonoscopy. You may have a sigmoidoscopy every 5 years or a colonoscopy every 10 years starting at age 47.  Hepatitis C blood test.  Hepatitis B blood test.  Sexually transmitted disease (STD) testing.  Diabetes screening. This is done by checking your blood sugar (glucose) after you have not eaten for a while (fasting). You may have this done every 1-3 years.  Bone density scan. This is done to screen for osteoporosis. You may have this done starting at age 45.  Mammogram. This may be done every 1-2 years. Talk to your health care provider about how often you should have regular mammograms. Talk with your health care provider about your test results, treatment  options, and if necessary, the need for more tests. Vaccines  Your health care provider may recommend certain vaccines, such as:  Influenza vaccine. This is recommended every year.  Tetanus, diphtheria, and acellular pertussis (Tdap, Td) vaccine.  You may need a Td booster every 10 years.  Zoster vaccine. You may need this after age 57.  Pneumococcal 13-valent conjugate (PCV13) vaccine. One dose is recommended after age 17.  Pneumococcal polysaccharide (PPSV23) vaccine. One dose is recommended after age 47. Talk to your health care provider about which screenings and vaccines you need and how often you need them. This information is not intended to replace advice given to you by your health care provider. Make sure you discuss any questions you have with your health care provider. Document Released: 01/28/2015 Document Revised: 09/21/2015 Document Reviewed: 11/02/2014 Elsevier Interactive Patient Education  2017 [place] Prevention in the Home Falls can cause injuries. They can happen to people of all ages. There are many things you can do to make your home safe and to help prevent falls. What can I do on the outside of my home?  Regularly fix the edges of walkways and driveways and fix any cracks.  Remove anything that might make you trip as you walk through a door, such as a raised step or threshold.  Trim any bushes or trees on the path to your home.  Use bright outdoor lighting.  Clear any walking paths of anything that might make someone trip, such as rocks or tools.  Regularly check to see if handrails are loose or broken. Make sure that both sides of any steps have handrails.  Any raised decks and porches should have guardrails on the edges.  Have any leaves, snow, or ice cleared regularly.  Use sand or salt on walking paths during winter.  Clean up any spills in your garage right away. This includes oil or grease spills. What can I do in the bathroom?  Use night lights.  Install grab bars by the toilet and in the tub and shower. Do not use towel bars as grab bars.  Use non-skid mats or decals in the tub or shower.  If you need to sit down in the shower, use a plastic, non-slip stool.  Keep the  floor dry. Clean up any water that spills on the floor as soon as it happens.  Remove soap buildup in the tub or shower regularly.  Attach bath mats securely with double-sided non-slip rug tape.  Do not have throw rugs and other things on the floor that can make you trip. What can I do in the bedroom?  Use night lights.  Make sure that you have a light by your bed that is easy to reach.  Do not use any sheets or blankets that are too big for your bed. They should not hang down onto the floor.  Have a firm chair that has side arms. You can use this for support while you get dressed.  Do not have throw rugs and other things on the floor that can make you trip. What can I do in the kitchen?  Clean up any spills right away.  Avoid walking on wet floors.  Keep items that you use a lot in easy-to-reach places.  If you need to reach something above you, use a strong step stool that has a grab bar.  Keep electrical cords out of the way.  Do not use floor polish or wax that makes floors slippery.  If you must use wax, use non-skid floor wax.  Do not have throw rugs and other things on the floor that can make you trip. What can I do with my stairs?  Do not leave any items on the stairs.  Make sure that there are handrails on both sides of the stairs and use them. Fix handrails that are broken or loose. Make sure that handrails are as long as the stairways.  Check any carpeting to make sure that it is firmly attached to the stairs. Fix any carpet that is loose or worn.  Avoid having throw rugs at the top or bottom of the stairs. If you do have throw rugs, attach them to the floor with carpet tape.  Make sure that you have a light switch at the top of the stairs and the bottom of the stairs. If you do not have them, ask someone to add them for you. What else can I do to help prevent falls?  Wear shoes that:  Do not have high heels.  Have rubber bottoms.  Are comfortable and fit  you well.  Are closed at the toe. Do not wear sandals.  If you use a stepladder:  Make sure that it is fully opened. Do not climb a closed stepladder.  Make sure that both sides of the stepladder are locked into place.  Ask someone to hold it for you, if possible.  Clearly mark and make sure that you can see:  Any grab bars or handrails.  First and last steps.  Where the edge of each step is.  Use tools that help you move around (mobility aids) if they are needed. These include:  Canes.  Walkers.  Scooters.  Crutches.  Turn on the lights when you go into a dark area. Replace any light bulbs as soon as they burn out.  Set up your furniture so you have a clear path. Avoid moving your furniture around.  If any of your floors are uneven, fix them.  If there are any pets around you, be aware of where they are.  Review your medicines with your doctor. Some medicines can make you feel dizzy. This can increase your chance of falling. Ask your doctor what other things that you can do to help prevent falls. This information is not intended to replace advice given to you by your health care provider. Make sure you discuss any questions you have with your health care provider. Document Released: 10/28/2008 Document Revised: 06/09/2015 Document Reviewed: 02/05/2014 Elsevier Interactive Patient Education  2017 [employer].

## 2018-11-03 NOTE — Progress Notes (Signed)
Subjective:   Robin Gilmore is a 68 y.o. female who presents for Medicare Annual (Subsequent) preventive examination.  Review of Systems:    This visit is being conducted through telemedicine via telephone at the nurse health advisor's home address due to the COVID-19 pandemic. This patient has given me verbal consent via doximity to conduct this visit, patient states they are participating from their home address. Patient and myself are on the telephone call. There is no referral for this visit. Some vital signs may be absent or patient reported.    Patient identification: identified by name, DOB, and current address  Cardiac Risk Factors include: advanced age (>71men, >22 women);dyslipidemia     Objective:     Vitals: There were no vitals taken for this visit.  There is no height or weight on file to calculate BMI.  Advanced Directives 11/03/2018 10/29/2017 08/23/2015  Does Patient Have a Medical Advance Directive? No No No  Would patient like information on creating a medical advance directive? No - Patient declined No - Patient declined Yes - Scientist, clinical (histocompatibility and immunogenetics) given    Tobacco Social History   Tobacco Use  Smoking Status Former Smoker  Smokeless Tobacco Never Used     Counseling given: Not Answered   Clinical Intake:  Pre-visit preparation completed: Yes  Pain : No/denies pain     Nutritional Risks: None Diabetes: No  How often do you need to have someone help you when you read instructions, pamphlets, or other written materials from your doctor or pharmacy?: 1 - Never What is the last grade level you completed in school?: some college  Interpreter Needed?: No  Information entered by :: CJohnson, LPN  Past Medical History:  Diagnosis Date  . Constipation   . Hyperlipidemia   . Hypothyroid   . IBS (irritable bowel syndrome)   . Menopausal syndrome   . Mitral prolapse and regurge used to have prophylaxis  . Obesity    Past Surgical History:   Procedure Laterality Date  . BREAST EXCISIONAL BIOPSY Right   . BREAST SURGERY     breast biopsy x 2 benign  . CESAREAN SECTION    . retrovaginal fistula repair     Family History  Problem Relation Age of Onset  . Alcohol abuse Mother   . Diabetes Mother   . Heart disease Mother        ? MI CAD  . Cancer Father        lung CA  . Alcohol abuse Father   . Hypertension Sister   . Hyperlipidemia Sister   . Alcohol abuse Brother   . Colon polyps Brother   . Obesity Maternal Grandfather   . Breast cancer Neg Hx    Social History   Socioeconomic History  . Marital status: Single    Spouse name: Not on file  . Number of children: Not on file  . Years of education: Not on file  . Highest education level: Not on file  Occupational History  . Not on file  Social Needs  . Financial resource strain: Not hard at all  . Food insecurity    Worry: Never true    Inability: Never true  . Transportation needs    Medical: No    Non-medical: No  Tobacco Use  . Smoking status: Former Research scientist (life sciences)  . Smokeless tobacco: Never Used  Substance and Sexual Activity  . Alcohol use: No    Alcohol/week: 0.0 standard drinks  . Drug use: No  .  Sexual activity: Never  Lifestyle  . Physical activity    Days per week: 0 days    Minutes per session: 0 min  . Stress: Not at all  Relationships  . Social Herbalist on phone: Not on file    Gets together: Not on file    Attends religious service: Not on file    Active member of club or organization: Not on file    Attends meetings of clubs or organizations: Not on file    Relationship status: Not on file  Other Topics Concern  . Not on file  Social History Narrative  . Not on file    Outpatient Encounter Medications as of 11/03/2018  Medication Sig  . albuterol (PROVENTIL HFA;VENTOLIN HFA) 108 (90 Base) MCG/ACT inhaler Inhale 2 puffs into the lungs every 6 (six) hours as needed for wheezing or shortness of breath.  Marland Kitchen amoxicillin  (AMOXIL) 500 MG capsule TAKE FOUR CAPSULES BY MOUTH ONE HOUR BEFORE APPOINTMENT  . Calcium Carbonate-Vit D-Min 600-400 MG-UNIT TABS Take 1 tablet by mouth 2 (two) times daily.  . chlorpheniramine (CHLOR-TRIMETON) 4 MG tablet Take 1 tablet (4 mg total) by mouth 2 (two) times daily as needed for allergies.  . Dextromethorphan-Guaifenesin (ROBITUSSIN DM PO) Take by mouth as needed.  Marland Kitchen levothyroxine (SYNTHROID, LEVOTHROID) 125 MCG tablet Take 1 tablet (125 mcg total) by mouth daily.   No facility-administered encounter medications on file as of 11/03/2018.     Activities of Daily Living In your present state of health, do you have any difficulty performing the following activities: 11/03/2018  Hearing? N  Vision? N  Difficulty concentrating or making decisions? N  Walking or climbing stairs? N  Dressing or bathing? N  Doing errands, shopping? N  Preparing Food and eating ? N  Using the Toilet? N  In the past six months, have you accidently leaked urine? N  Do you have problems with loss of bowel control? N  Managing your Medications? N  Managing your Finances? N  Housekeeping or managing your Housekeeping? N  Some recent data might be hidden    Patient Care Team: Tower, Wynelle Fanny, MD as PCP - General    Assessment:   This is a routine wellness examination for Robin Gilmore.  Exercise Activities and Dietary recommendations Current Exercise Habits: The patient does not participate in regular exercise at present, Exercise limited by: None identified  Goals    . Increase physical activity     Starting 10/29/2017, I will continue to walk at 30 minutes 2-3 days a week and to do strength training with hand weights intermittently.     . Patient Stated     11/03/2018, I will start exercising more on a daily basis.        Fall Risk Fall Risk  11/03/2018 10/29/2017 08/23/2015  Falls in the past year? 0 No Yes  Comment - - fell over pet on floor  Number falls in past yr: 0 - 1  Injury with  Fall? 0 - No  Risk for fall due to : Medication side effect - -  Follow up Falls evaluation completed;Falls prevention discussed - Falls evaluation completed   Is the patient's home free of loose throw rugs in walkways, pet beds, electrical cords, etc?   yes      Grab bars in the bathroom? yes      Handrails on the stairs?   yes      Adequate lighting?   yes  Timed Get Up and Go performed: N/A  Depression Screen PHQ 2/9 Scores 11/03/2018 10/29/2017 08/15/2016 08/23/2015  PHQ - 2 Score 0 0 0 0  PHQ- 9 Score 0 0 3 -     Cognitive Function MMSE - Mini Mental State Exam 11/03/2018 10/29/2017 08/23/2015  Orientation to time 5 5 5   Orientation to Place 5 5 5   Registration 3 3 3   Attention/ Calculation 5 0 0  Recall 3 3 3   Language- name 2 objects - 0 0  Language- repeat 1 1 1   Language- follow 3 step command - 3 3  Language- read & follow direction - 0 0  Write a sentence - 0 0  Copy design - 0 0  Total score - 20 20  Mini Cog  Mini-Cog screen was completed. Maximum score is 22. A value of 0 denotes this part of the MMSE was not completed or the patient failed this part of the Mini-Cog screening.      Immunization History  Administered Date(s) Administered  . Influenza Split 01/29/2011  . Influenza Whole 11/21/2009  . Influenza, High Dose Seasonal PF 10/24/2016, 10/29/2017  . Influenza,inj,Quad PF,6+ Mos 11/18/2013  . Influenza-Unspecified 11/12/2012, 10/24/2016  . Pneumococcal Conjugate-13 08/09/2015  . Pneumococcal Polysaccharide-23 08/15/2016  . Td 11/15/2001  . Tdap 01/29/2011  . Zoster 11/22/2012    Qualifies for Shingles Vaccine? Yes  Screening Tests Health Maintenance  Topic Date Due  . INFLUENZA VACCINE  08/16/2018  . MAMMOGRAM  09/03/2019  . TETANUS/TDAP  01/28/2021  . COLONOSCOPY  04/01/2021  . DEXA SCAN  Completed  . Hepatitis C Screening  Completed  . PNA vac Low Risk Adult  Completed    Cancer Screenings: Lung: Low Dose CT Chest recommended if Age  33-80 years, 30 pack-year currently smoking OR have quit w/in 15years. Patient does not qualify. Breast:  Up to date on Mammogram? Yes, completed 09/03/2018   Up to date of Bone Density/Dexa? Yes, completed 11/14/2015 Colorectal: completed 04/02/2011  Additional Screenings:  Hepatitis C Screening: 10/29/2017     Plan:   Patient will start exercising more on a daily basis.  I have personally reviewed and noted the following in the patient's chart:   . Medical and social history . Use of alcohol, tobacco or illicit drugs  . Current medications and supplements . Functional ability and status . Nutritional status . Physical activity . Advanced directives . List of other physicians . Hospitalizations, surgeries, and ER visits in previous 12 months . Vitals . Screenings to include cognitive, depression, and falls . Referrals and appointments  In addition, I have reviewed and discussed with patient certain preventive protocols, quality metrics, and best practice recommendations. A written personalized care plan for preventive services as well as general preventive health recommendations were provided to patient.     Andrez Grime, LPN  [id number]

## 2018-11-10 ENCOUNTER — Other Ambulatory Visit: Payer: Self-pay

## 2018-11-10 ENCOUNTER — Encounter: Payer: Self-pay | Admitting: Family Medicine

## 2018-11-10 ENCOUNTER — Ambulatory Visit (INDEPENDENT_AMBULATORY_CARE_PROVIDER_SITE_OTHER): Payer: PPO | Admitting: Family Medicine

## 2018-11-10 VITALS — BP 142/78 | HR 68 | Temp 96.7°F | Ht 67.25 in | Wt 197.5 lb

## 2018-11-10 DIAGNOSIS — Z23 Encounter for immunization: Secondary | ICD-10-CM | POA: Diagnosis not present

## 2018-11-10 DIAGNOSIS — I10 Essential (primary) hypertension: Secondary | ICD-10-CM | POA: Insufficient documentation

## 2018-11-10 DIAGNOSIS — R7303 Prediabetes: Secondary | ICD-10-CM | POA: Insufficient documentation

## 2018-11-10 DIAGNOSIS — Z0001 Encounter for general adult medical examination with abnormal findings: Secondary | ICD-10-CM

## 2018-11-10 DIAGNOSIS — R030 Elevated blood-pressure reading, without diagnosis of hypertension: Secondary | ICD-10-CM | POA: Diagnosis not present

## 2018-11-10 DIAGNOSIS — E039 Hypothyroidism, unspecified: Secondary | ICD-10-CM | POA: Diagnosis not present

## 2018-11-10 DIAGNOSIS — Z6830 Body mass index (BMI) 30.0-30.9, adult: Secondary | ICD-10-CM

## 2018-11-10 DIAGNOSIS — E6609 Other obesity due to excess calories: Secondary | ICD-10-CM | POA: Diagnosis not present

## 2018-11-10 DIAGNOSIS — R7309 Other abnormal glucose: Secondary | ICD-10-CM | POA: Insufficient documentation

## 2018-11-10 DIAGNOSIS — Z0000 Encounter for general adult medical examination without abnormal findings: Secondary | ICD-10-CM

## 2018-11-10 DIAGNOSIS — E785 Hyperlipidemia, unspecified: Secondary | ICD-10-CM

## 2018-11-10 MED ORDER — LEVOTHYROXINE SODIUM 150 MCG PO TABS: 150.0000 ug | ORAL_TABLET | Freq: Every day | ORAL | 3 refills | Status: DC

## 2018-11-10 NOTE — Assessment & Plan Note (Signed)
BP: (!) 142/78    Pt will work on diet/exercise/wt loss  F/u approx 6 wk Re check  Consider medication if needed

## 2018-11-10 NOTE — Assessment & Plan Note (Signed)
Very mild at 103 She is "a sugar addict" Disc plan to get away from processed carbs disc imp of low glycemic diet and wt loss to prevent DM2  Will need a1c next year

## 2018-11-10 NOTE — Progress Notes (Signed)
Subjective:    Patient ID: Robin Gilmore, female    DOB: 1950/11/15, 68 y.o.   MRN: [id number]  HPI  Here for health maintenance exam and to review chronic medical problems Feeling ok overall  Still working 2 d per week and keeps grand kids    Wt Readings from Last 3 Encounters:  11/10/18 197 lb 8 oz (89.6 kg)  11/04/17 195 lb 8 oz (88.7 kg)  10/29/17 195 lb 8 oz (88.7 kg)  not doing well with healthy diet and exercise  Cooked and ate when she was home from work  Walking several days per week  30.70 kg/m   Addicted to sugar   Has exercise equip in house   Glucose 103 - a little high  amw was 10/19 Needs flu shot  No gaps or concerns   Flu vaccine given today  Mammogram 09/03/18 Self breast exam - no lumps or changes   Colonoscopy 3/13  dexa 10/17- BMD in the normal range   Had zostavax 11/14  Interested in shingrix   BP Readings from Last 3 Encounters:  11/10/18 (!) 142/78  11/04/17 130/73  10/29/17 140/84   Pulse Readings from Last 3 Encounters:  11/10/18 68  11/04/17 65  10/29/17 80   Hypothyroidism  Pt has no clinical changes Is more tired  Lab Results  Component Value Date   TSH 5.50 (H) 11/03/2018    No missed doses  Takes levothy 125    Hyperlipidemia Lab Results  Component Value Date   CHOL 193 11/03/2018   CHOL 197 10/29/2017   CHOL 186 08/15/2016   Lab Results  Component Value Date   HDL 47.00 11/03/2018   HDL 46.70 10/29/2017   HDL 42.30 08/15/2016   Lab Results  Component Value Date   LDLCALC 98 08/02/2015   LDLCALC 113 (H) 12/04/2010   [place] 95 11/21/2009   Lab Results  Component Value Date   TRIG 234.0 (H) 11/03/2018   TRIG 261.0 (H) 10/29/2017   TRIG 390.0 (H) 08/15/2016   Lab Results  Component Value Date   CHOLHDL 4 11/03/2018   CHOLHDL 4 10/29/2017   CHOLHDL 4 08/15/2016   Lab Results  Component Value Date   LDLDIRECT 90.0 11/03/2018   LDLDIRECT 111.0 10/29/2017   LDLDIRECT 82.0 08/15/2016   LDL  is down to 90  HDL is the same Trig down a bit  occ beef-mostly chicken  Lots of vegetable   Results for orders placed or performed in visit on 11/03/18  TSH  Result Value Ref Range   TSH 5.50 (H) 0.35 - 4.50 uIU/mL  Lipid panel  Result Value Ref Range   Cholesterol 193 0 - 200 mg/dL   Triglycerides 234.0 (H) 0.0 - 149.0 mg/dL   HDL 47.00 >39.00 mg/dL   VLDL 46.8 (H) 0.0 - 40.0 mg/dL   Total CHOL/HDL Ratio 4    NonHDL 146.38   Comprehensive metabolic panel  Result Value Ref Range   Sodium 138 135 - 145 mEq/L   Potassium 4.1 3.5 - 5.1 mEq/L   Chloride 101 96 - 112 mEq/L   CO2 27 19 - 32 mEq/L   Glucose, Bld 103 (H) 70 - 99 mg/dL   BUN 16 6 - 23 mg/dL   Creatinine, Ser 0.60 0.40 - 1.20 mg/dL   Total Bilirubin 0.6 0.2 - 1.2 mg/dL   Alkaline Phosphatase 56 39 - 117 U/L   AST 18 0 - 37 U/L   ALT 22 0 -  35 U/L   Total Protein 7.3 6.0 - 8.3 g/dL   Albumin 4.6 3.5 - 5.2 g/dL   Calcium 9.7 8.4 - 10.5 mg/dL   GFR 98.90 >60.00 mL/min  CBC with Differential/Platelet  Result Value Ref Range   WBC 7.2 4.0 - 10.5 K/uL   RBC 4.95 3.87 - 5.11 Mil/uL   Hemoglobin 14.3 12.0 - 15.0 g/dL   HCT 42.5 36.0 - 46.0 %   MCV 85.9 78.0 - 100.0 fl   MCHC 33.6 30.0 - 36.0 g/dL   RDW 13.8 11.5 - 15.5 %   Platelets 307.0 150.0 - 400.0 K/uL   Neutrophils Relative % 62.1 43.0 - 77.0 %   Lymphocytes Relative 23.9 12.0 - 46.0 %   Monocytes Relative 8.9 3.0 - 12.0 %   Eosinophils Relative 2.2 0.0 - 5.0 %   Basophils Relative 2.9 0.0 - 3.0 %   Neutro Abs 4.5 1.4 - 7.7 K/uL   Lymphs Abs 1.7 0.7 - 4.0 K/uL   Monocytes Absolute 0.6 0.1 - 1.0 K/uL   Eosinophils Absolute 0.2 0.0 - 0.7 K/uL   Basophils Absolute 0.2 (H) 0.0 - 0.1 K/uL  LDL cholesterol, direct  Result Value Ref Range   Direct LDL 90.0 mg/dL     Patient Active Problem List   Diagnosis Date Noted  . Elevated blood pressure reading 11/10/2018  . Elevated glucose level 11/10/2018  . Mitral valve prolapse 11/04/2017  . Estrogen  deficiency 08/09/2015  . Obesity 11/18/2013  . Encounter for routine gynecological examination 06/30/2012  . Routine general medical examination at a health care facility 01/29/2011  . Family history of colonic polyps 01/29/2011  . Hypothyroidism 05/16/2009  . Hyperlipidemia LDL goal <130 05/16/2009  . CYSTOCELE WITHOUT MENTION UTERINE PROLAPSE LAT 05/16/2009   Past Medical History:  Diagnosis Date  . Constipation   . Hyperlipidemia   . Hypothyroid   . IBS (irritable bowel syndrome)   . Menopausal syndrome   . Mitral prolapse and regurge used to have prophylaxis  . Obesity    Past Surgical History:  Procedure Laterality Date  . BREAST EXCISIONAL BIOPSY Right   . BREAST SURGERY     breast biopsy x 2 benign  . CESAREAN SECTION    . retrovaginal fistula repair     Social History   Tobacco Use  . Smoking status: Former Research scientist (life sciences)  . Smokeless tobacco: Never Used  Substance Use Topics  . Alcohol use: No    Alcohol/week: 0.0 standard drinks  . Drug use: No   Family History  Problem Relation Age of Onset  . Alcohol abuse Mother   . Diabetes Mother   . Heart disease Mother        ? MI CAD  . Cancer Father        lung CA  . Alcohol abuse Father   . Hypertension Sister   . Hyperlipidemia Sister   . Alcohol abuse Brother   . Colon polyps Brother   . Obesity Maternal Grandfather   . Breast cancer Neg Hx    Allergies  Allergen Reactions  . Codeine     REACTION: Severe nausea and vomitnig   Current Outpatient Medications on File Prior to Visit  Medication Sig Dispense Refill  . albuterol (PROVENTIL HFA;VENTOLIN HFA) 108 (90 Base) MCG/ACT inhaler Inhale 2 puffs into the lungs every 6 (six) hours as needed for wheezing or shortness of breath. 3 Inhaler 3  . amoxicillin (AMOXIL) 500 MG capsule TAKE FOUR CAPSULES BY MOUTH ONE  HOUR BEFORE APPOINTMENT  0  . Calcium Carbonate-Vit D-Min 600-400 MG-UNIT TABS Take 1 tablet by mouth 2 (two) times daily.    . chlorpheniramine  (CHLOR-TRIMETON) 4 MG tablet Take 1 tablet (4 mg total) by mouth 2 (two) times daily as needed for allergies.    . Dextromethorphan-Guaifenesin (ROBITUSSIN DM PO) Take by mouth as needed.     No current facility-administered medications on file prior to visit.      Review of Systems  Constitutional: Positive for fatigue. Negative for activity change, appetite change, fever and unexpected weight change.  HENT: Negative for congestion, ear pain, rhinorrhea, sinus pressure and sore throat.   Eyes: Negative for pain, redness and visual disturbance.  Respiratory: Negative for cough, shortness of breath and wheezing.   Cardiovascular: Negative for chest pain and palpitations.  Gastrointestinal: Negative for abdominal pain, blood in stool, constipation and diarrhea.  Endocrine: Negative for polydipsia and polyuria.  Genitourinary: Negative for dysuria, frequency and urgency.  Musculoskeletal: Negative for arthralgias, back pain and myalgias.  Skin: Negative for pallor and rash.  Allergic/Immunologic: Negative for environmental allergies.  Neurological: Negative for dizziness, syncope and headaches.  Hematological: Negative for adenopathy. Does not bruise/bleed easily.  Psychiatric/Behavioral: Negative for decreased concentration and dysphoric mood. The patient is not nervous/anxious.        Objective:   Physical Exam Constitutional:      General: She is not in acute distress.    Appearance: Normal appearance. She is well-developed. She is obese. She is not ill-appearing or diaphoretic.  HENT:     Head: Normocephalic and atraumatic.     Right Ear: Tympanic membrane, ear canal and external ear normal.     Left Ear: Tympanic membrane, ear canal and external ear normal.     Nose: Nose normal. No congestion.     Mouth/Throat:     Mouth: Mucous membranes are moist.     Pharynx: Oropharynx is clear. No posterior oropharyngeal erythema.  Eyes:     General: No scleral icterus.    Extraocular  Movements: Extraocular movements intact.     Conjunctiva/sclera: Conjunctivae normal.     Pupils: Pupils are equal, round, and reactive to light.  Neck:     Musculoskeletal: Normal range of motion and neck supple. No neck rigidity or muscular tenderness.     Thyroid: No thyromegaly.     Vascular: No carotid bruit or JVD.  Cardiovascular:     Rate and Rhythm: Normal rate and regular rhythm.     Pulses: Normal pulses.     Heart sounds: Normal heart sounds. No gallop.   Pulmonary:     Effort: Pulmonary effort is normal. No respiratory distress.     Breath sounds: Normal breath sounds. No wheezing.     Comments: Good air exch Chest:     Chest wall: No tenderness.  Abdominal:     General: Bowel sounds are normal. There is no distension or abdominal bruit.     Palpations: Abdomen is soft. There is no mass.     Tenderness: There is no abdominal tenderness.     Hernia: No hernia is present.  Genitourinary:    Comments: Breast exam: No mass, nodules, thickening, tenderness, bulging, retraction, inflamation, nipple discharge or skin changes noted.  No axillary or clavicular LA.     Musculoskeletal: Normal range of motion.        General: No tenderness.     Right lower leg: No edema.     Left lower leg: No  edema.  Lymphadenopathy:     Cervical: No cervical adenopathy.  Skin:    General: Skin is warm and dry.     Coloration: Skin is not pale.     Findings: No erythema or rash.     Comments: Lentigines and small brown nevi on trunk Some angiomas  Neurological:     Mental Status: She is alert. Mental status is at baseline.     Cranial Nerves: No cranial nerve deficit.     Motor: No abnormal muscle tone.     Coordination: Coordination normal.     Gait: Gait normal.     Deep Tendon Reflexes: Reflexes are normal and symmetric. Reflexes normal.  Psychiatric:        Mood and Affect: Mood normal.        Cognition and Memory: Cognition and memory normal.           Assessment & Plan:    Problem List Items Addressed This Visit      Endocrine   Hypothyroidism    Lab Results  Component Value Date   TSH 5.50 (H) 11/03/2018   No missed doses Will inc levothyroxine to 150 mcg daily  F/u in about 6 wk for re check  Hope this will help fatigue      Relevant Medications   levothyroxine (SYNTHROID) 150 MCG tablet     Other   Hyperlipidemia LDL goal <130    Disc goals for lipids and reasons to control them Rev last labs with pt Rev low sat fat diet in detail LDL is down to 90 -commended       Routine general medical examination at a health care facility - Primary    Reviewed health habits including diet and exercise and skin cancer prevention Reviewed appropriate screening tests for age  Also reviewed health mt list, fam hx and immunization status , as well as social and family history   See HPI Labs reviewed  amw reviewed Flu shot given  Mildly elevated glucose-watching  Disc shingrix-she will check on coverage       Obesity    Discussed how this problem influences overall health and the risks it imposes  Reviewed plan for weight loss with lower calorie diet (via better food choices and also portion control or program like weight watchers) and exercise building up to or more than 30 minutes 5 days per week including some aerobic activity         Elevated blood pressure reading    BP: (!) 142/78    Pt will work on diet/exercise/wt loss  F/u approx 6 wk Re check  Consider medication if needed      Elevated glucose level    Very mild at 103 She is "a sugar addict" Disc plan to get away from processed carbs disc imp of low glycemic diet and wt loss to prevent DM2  Will need a1c next year       Other Visit Diagnoses    Need for influenza vaccination       Relevant Orders   Flu Vaccine QUAD High Dose(Fluad) (Completed)

## 2018-11-10 NOTE — Assessment & Plan Note (Signed)
Disc goals for lipids and reasons to control them Rev last labs with pt Rev low sat fat diet in detail LDL is down to 90 -commended

## 2018-11-10 NOTE — Assessment & Plan Note (Signed)
Discussed how this problem influences overall health and the risks it imposes  Reviewed plan for weight loss with lower calorie diet (via better food choices and also portion control or program like weight watchers) and exercise building up to or more than 30 minutes 5 days per week including some aerobic activity    

## 2018-11-10 NOTE — Patient Instructions (Addendum)
For diabetes prevention and weight loss Try to get most of your carbohydrates from produce (with the exception of white potatoes)  Eat less bread/pasta/rice/snack foods/cereals/sweets and other items from the middle of the grocery store (processed carbs)   Increase exercise to 5 days per week   If you are interested in the new shingles vaccine (Shingrix) - call your local pharmacy to check on coverage and availability  If affordable, get on a wait list at your pharmacy to get the vaccine.    blood pressure is up  Please work on diet and exercise  Follow up in 6 weeks for blood pressure and to re check thyroid  Levothyroxine dose is going up to 150 mcg

## 2018-11-10 NOTE — Assessment & Plan Note (Signed)
Lab Results  Component Value Date   TSH 5.50 (H) 11/03/2018   No missed doses Will inc levothyroxine to 150 mcg daily  F/u in about 6 wk for re check  Hope this will help fatigue

## 2018-11-10 NOTE — Assessment & Plan Note (Signed)
Reviewed health habits including diet and exercise and skin cancer prevention Reviewed appropriate screening tests for age  Also reviewed health mt list, fam hx and immunization status , as well as social and family history   See HPI Labs reviewed  amw reviewed Flu shot given  Mildly elevated glucose-watching  Disc shingrix-she will check on coverage

## 2018-12-29 ENCOUNTER — Telehealth: Payer: Self-pay | Admitting: Family Medicine

## 2018-12-29 DIAGNOSIS — R7309 Other abnormal glucose: Secondary | ICD-10-CM

## 2018-12-29 DIAGNOSIS — E039 Hypothyroidism, unspecified: Secondary | ICD-10-CM

## 2018-12-29 NOTE — Telephone Encounter (Signed)
-----   Message from Cloyd Stagers, RT sent at 12/29/2018  4:27 PM EST ----- Regarding: Lab Orders for Tuesday 12.15.2020 Please place lab orders for Tuesday 12.15.2020, this pt was just added to the schedule Thanks Tam

## 2018-12-30 ENCOUNTER — Other Ambulatory Visit (INDEPENDENT_AMBULATORY_CARE_PROVIDER_SITE_OTHER): Payer: PPO

## 2018-12-30 ENCOUNTER — Other Ambulatory Visit: Payer: Self-pay

## 2018-12-30 DIAGNOSIS — E039 Hypothyroidism, unspecified: Secondary | ICD-10-CM | POA: Diagnosis not present

## 2018-12-30 DIAGNOSIS — R7309 Other abnormal glucose: Secondary | ICD-10-CM | POA: Diagnosis not present

## 2018-12-30 LAB — TSH: TSH: 0.77 u[IU]/mL (ref 0.35–4.50)

## 2018-12-30 LAB — HEMOGLOBIN A1C: Hgb A1c MFr Bld: 5.3 % (ref 4.6–6.5)

## 2018-12-30 NOTE — Progress Notes (Cosign Needed)
Per Dr Marliss Coots encounter order on 11/10/2018, patient presents today for a lab/nurse visit blood pressure check for ongoing follow up and management.  Vital Sign Readings today BP 148/94 P 72.

## 2018-12-31 ENCOUNTER — Telehealth: Payer: Self-pay | Admitting: *Deleted

## 2018-12-31 MED ORDER — AMLODIPINE BESYLATE 5 MG PO TABS: 5.0000 mg | ORAL_TABLET | Freq: Every day | ORAL | 1 refills | Status: DC

## 2018-12-31 NOTE — Telephone Encounter (Signed)
Spoke to pt. She understands and will let us know before the Christmas holiday if she is having any issues.

## 2018-12-31 NOTE — Telephone Encounter (Signed)
Pt agrees with starting BP med. Pt uses [facility] for [employer] [facility] and [facility] for mail order. Pt asked if the [facility] BP cuff is accurate. I told her to bring her BP cuff with her at next visit and we can compare it to the BP reading we get here. Pt said after nurse visit she went home and checked BP with her cuff and it was 132/70, pt said she then checked it later in the afternoon yesterday and it was 146/65, pt said she is still willing to start BP med if Dr. Glori Bickers recommends it

## 2018-12-31 NOTE — Telephone Encounter (Signed)
I sent amlodipine 5 mg to her local phamacy- we can move it to mail order once we know it works and she tolerates it well  If any side effects please alert me  Avoid excess sodium  Exercise helps also   Check bp at home daily and as needed (when relaxed) and call in about 10-14 days to let us know how they are   Goal is under [id number] systolic/ under 90 diastolic

## 2018-12-31 NOTE — Telephone Encounter (Signed)
Patient returned call.  Please call patient back at [phone].

## 2018-12-31 NOTE — Telephone Encounter (Signed)
Left VM requesting pt to call the office back 

## 2018-12-31 NOTE — Telephone Encounter (Signed)
Pt's BP was 148/94 at nurse visit yesterday.  Dr. Glori Bickers sent me a message saying:  Tower, Wynelle Fanny, MD  [place], [place] M, CMA  Please let pt know her bp is high so I want to start her on medication  Let me know if agreeable

## 2018-12-31 NOTE — Telephone Encounter (Signed)
Patient returned Shapale's call.  Patient can be reached at [phone].

## 2018-12-31 NOTE — Telephone Encounter (Signed)
Called pt and no answer so left VM requesting pt to call the office back 

## 2019-01-12 ENCOUNTER — Telehealth: Payer: Self-pay | Admitting: Family Medicine

## 2019-01-12 MED ORDER — AMLODIPINE BESYLATE 5 MG PO TABS: 5.0000 mg | ORAL_TABLET | Freq: Every day | ORAL | 3 refills | Status: DC

## 2019-01-12 NOTE — Telephone Encounter (Signed)
Pt.notified

## 2019-01-12 NOTE — Telephone Encounter (Signed)
Patient called today Stated she was prescribed Amlodipine then after taking this for 2 weeks she was advised to call back to give an update. Patient said that she was doing fine with the medication and would like the prescription sent to her mail order pharmacy    [facility] - [place], [place]

## 2019-01-12 NOTE — Telephone Encounter (Signed)
Good to hear that I sent it

## 2019-03-15 ENCOUNTER — Other Ambulatory Visit: Payer: Self-pay

## 2019-03-15 ENCOUNTER — Ambulatory Visit: Payer: PPO | Attending: Internal Medicine

## 2019-03-15 DIAGNOSIS — Z23 Encounter for immunization: Secondary | ICD-10-CM | POA: Insufficient documentation

## 2019-03-15 NOTE — Progress Notes (Signed)
   Covid-19 Vaccination Clinic  Name:  Robin Gilmore    MRN: [id number] DOB: 09/26/1950  03/15/2019  [name] was observed post Covid-19 immunization for 15 minutes without incidence. She was provided with Vaccine Information Sheet and instruction to access the V-Safe system.   [name] was instructed to call 911 with any severe reactions post vaccine: Marland Kitchen Difficulty breathing  . Swelling of your face and throat  . A fast heartbeat  . A bad rash all over your body  . Dizziness and weakness    Immunizations Administered    Name Date Dose VIS Date Route   Pfizer COVID-19 Vaccine 03/15/2019  8:27 AM 0.3 mL 12/26/2018 Intramuscular   Manufacturer: [place]   Lot: [id number]   [place]: [id number]

## 2019-03-17 DIAGNOSIS — H25813 Combined forms of age-related cataract, bilateral: Secondary | ICD-10-CM | POA: Diagnosis not present

## 2019-04-07 DIAGNOSIS — M5442 Lumbago with sciatica, left side: Secondary | ICD-10-CM | POA: Diagnosis not present

## 2019-04-07 DIAGNOSIS — M461 Sacroiliitis, not elsewhere classified: Secondary | ICD-10-CM | POA: Diagnosis not present

## 2019-04-07 DIAGNOSIS — M9903 Segmental and somatic dysfunction of lumbar region: Secondary | ICD-10-CM | POA: Diagnosis not present

## 2019-04-07 DIAGNOSIS — M9904 Segmental and somatic dysfunction of sacral region: Secondary | ICD-10-CM | POA: Diagnosis not present

## 2019-04-08 ENCOUNTER — Ambulatory Visit: Payer: PPO | Attending: Internal Medicine

## 2019-04-08 DIAGNOSIS — Z23 Encounter for immunization: Secondary | ICD-10-CM

## 2019-04-08 NOTE — Progress Notes (Signed)
   Covid-19 Vaccination Clinic  Name:  Robin Gilmore    MRN: [id number] DOB: 08-Mar-1950  04/08/2019  [name] was observed post Covid-19 immunization for 15 minutes without incident. She was provided with Vaccine Information Sheet and instruction to access the V-Safe system.   [name] was instructed to call 911 with any severe reactions post vaccine: Marland Kitchen Difficulty breathing  . Swelling of face and throat  . A fast heartbeat  . A bad rash all over body  . Dizziness and weakness   Immunizations Administered    Name Date Dose VIS Date Route   Pfizer COVID-19 Vaccine 04/08/2019  8:25 AM 0.3 mL 12/26/2018 Intramuscular   Manufacturer: [place]   Lot: [id number]   [place]: [id number]

## 2019-04-10 DIAGNOSIS — M5442 Lumbago with sciatica, left side: Secondary | ICD-10-CM | POA: Diagnosis not present

## 2019-04-10 DIAGNOSIS — M9904 Segmental and somatic dysfunction of sacral region: Secondary | ICD-10-CM | POA: Diagnosis not present

## 2019-04-10 DIAGNOSIS — M461 Sacroiliitis, not elsewhere classified: Secondary | ICD-10-CM | POA: Diagnosis not present

## 2019-04-10 DIAGNOSIS — M9903 Segmental and somatic dysfunction of lumbar region: Secondary | ICD-10-CM | POA: Diagnosis not present

## 2019-04-13 DIAGNOSIS — M9904 Segmental and somatic dysfunction of sacral region: Secondary | ICD-10-CM | POA: Diagnosis not present

## 2019-04-13 DIAGNOSIS — M9903 Segmental and somatic dysfunction of lumbar region: Secondary | ICD-10-CM | POA: Diagnosis not present

## 2019-04-13 DIAGNOSIS — M5442 Lumbago with sciatica, left side: Secondary | ICD-10-CM | POA: Diagnosis not present

## 2019-04-13 DIAGNOSIS — M461 Sacroiliitis, not elsewhere classified: Secondary | ICD-10-CM | POA: Diagnosis not present

## 2019-04-14 DIAGNOSIS — M461 Sacroiliitis, not elsewhere classified: Secondary | ICD-10-CM | POA: Diagnosis not present

## 2019-04-14 DIAGNOSIS — M5442 Lumbago with sciatica, left side: Secondary | ICD-10-CM | POA: Diagnosis not present

## 2019-04-14 DIAGNOSIS — M9903 Segmental and somatic dysfunction of lumbar region: Secondary | ICD-10-CM | POA: Diagnosis not present

## 2019-04-14 DIAGNOSIS — M9904 Segmental and somatic dysfunction of sacral region: Secondary | ICD-10-CM | POA: Diagnosis not present

## 2019-04-17 DIAGNOSIS — M9903 Segmental and somatic dysfunction of lumbar region: Secondary | ICD-10-CM | POA: Diagnosis not present

## 2019-04-17 DIAGNOSIS — M9904 Segmental and somatic dysfunction of sacral region: Secondary | ICD-10-CM | POA: Diagnosis not present

## 2019-04-17 DIAGNOSIS — M5442 Lumbago with sciatica, left side: Secondary | ICD-10-CM | POA: Diagnosis not present

## 2019-04-17 DIAGNOSIS — M461 Sacroiliitis, not elsewhere classified: Secondary | ICD-10-CM | POA: Diagnosis not present

## 2019-04-21 DIAGNOSIS — M9903 Segmental and somatic dysfunction of lumbar region: Secondary | ICD-10-CM | POA: Diagnosis not present

## 2019-04-21 DIAGNOSIS — M5442 Lumbago with sciatica, left side: Secondary | ICD-10-CM | POA: Diagnosis not present

## 2019-04-21 DIAGNOSIS — M461 Sacroiliitis, not elsewhere classified: Secondary | ICD-10-CM | POA: Diagnosis not present

## 2019-04-21 DIAGNOSIS — M9904 Segmental and somatic dysfunction of sacral region: Secondary | ICD-10-CM | POA: Diagnosis not present

## 2019-04-28 DIAGNOSIS — M5442 Lumbago with sciatica, left side: Secondary | ICD-10-CM | POA: Diagnosis not present

## 2019-04-28 DIAGNOSIS — M461 Sacroiliitis, not elsewhere classified: Secondary | ICD-10-CM | POA: Diagnosis not present

## 2019-04-28 DIAGNOSIS — M9904 Segmental and somatic dysfunction of sacral region: Secondary | ICD-10-CM | POA: Diagnosis not present

## 2019-04-28 DIAGNOSIS — M9903 Segmental and somatic dysfunction of lumbar region: Secondary | ICD-10-CM | POA: Diagnosis not present

## 2019-05-12 DIAGNOSIS — M5442 Lumbago with sciatica, left side: Secondary | ICD-10-CM | POA: Diagnosis not present

## 2019-05-21 ENCOUNTER — Other Ambulatory Visit: Payer: Self-pay | Admitting: Neurosurgery

## 2019-05-21 DIAGNOSIS — M5442 Lumbago with sciatica, left side: Secondary | ICD-10-CM

## 2019-05-31 ENCOUNTER — Ambulatory Visit
Admission: RE | Admit: 2019-05-31 | Discharge: 2019-05-31 | Disposition: A | Discharge status: Home / Self Care | Payer: PPO | Source: Ambulatory Visit | Attending: Neurosurgery | Admitting: Neurosurgery

## 2019-05-31 DIAGNOSIS — M5442 Lumbago with sciatica, left side: Secondary | ICD-10-CM

## 2019-05-31 DIAGNOSIS — M545 Low back pain: Secondary | ICD-10-CM | POA: Diagnosis not present

## 2019-06-03 DIAGNOSIS — Z6825 Body mass index (BMI) 25.0-25.9, adult: Secondary | ICD-10-CM | POA: Diagnosis not present

## 2019-06-03 DIAGNOSIS — I10 Essential (primary) hypertension: Secondary | ICD-10-CM | POA: Diagnosis not present

## 2019-06-03 DIAGNOSIS — M5442 Lumbago with sciatica, left side: Secondary | ICD-10-CM | POA: Diagnosis not present

## 2019-06-08 DIAGNOSIS — M5442 Lumbago with sciatica, left side: Secondary | ICD-10-CM | POA: Diagnosis not present

## 2019-06-08 DIAGNOSIS — M9903 Segmental and somatic dysfunction of lumbar region: Secondary | ICD-10-CM | POA: Diagnosis not present

## 2019-06-08 DIAGNOSIS — M461 Sacroiliitis, not elsewhere classified: Secondary | ICD-10-CM | POA: Diagnosis not present

## 2019-06-08 DIAGNOSIS — M9904 Segmental and somatic dysfunction of sacral region: Secondary | ICD-10-CM | POA: Diagnosis not present

## 2019-07-06 DIAGNOSIS — M9903 Segmental and somatic dysfunction of lumbar region: Secondary | ICD-10-CM | POA: Diagnosis not present

## 2019-07-06 DIAGNOSIS — M461 Sacroiliitis, not elsewhere classified: Secondary | ICD-10-CM | POA: Diagnosis not present

## 2019-07-06 DIAGNOSIS — M5442 Lumbago with sciatica, left side: Secondary | ICD-10-CM | POA: Diagnosis not present

## 2019-07-06 DIAGNOSIS — M9904 Segmental and somatic dysfunction of sacral region: Secondary | ICD-10-CM | POA: Diagnosis not present

## 2019-07-20 DIAGNOSIS — M79622 Pain in left upper arm: Secondary | ICD-10-CM | POA: Diagnosis not present

## 2019-07-20 DIAGNOSIS — M19012 Primary osteoarthritis, left shoulder: Secondary | ICD-10-CM | POA: Diagnosis not present

## 2019-07-27 DIAGNOSIS — M9903 Segmental and somatic dysfunction of lumbar region: Secondary | ICD-10-CM | POA: Diagnosis not present

## 2019-07-27 DIAGNOSIS — M5442 Lumbago with sciatica, left side: Secondary | ICD-10-CM | POA: Diagnosis not present

## 2019-07-27 DIAGNOSIS — M461 Sacroiliitis, not elsewhere classified: Secondary | ICD-10-CM | POA: Diagnosis not present

## 2019-07-27 DIAGNOSIS — M9904 Segmental and somatic dysfunction of sacral region: Secondary | ICD-10-CM | POA: Diagnosis not present

## 2019-07-29 ENCOUNTER — Other Ambulatory Visit: Payer: Self-pay | Admitting: Student

## 2019-07-29 DIAGNOSIS — S46012A Strain of muscle(s) and tendon(s) of the rotator cuff of left shoulder, initial encounter: Secondary | ICD-10-CM | POA: Diagnosis not present

## 2019-08-16 ENCOUNTER — Ambulatory Visit
Admission: RE | Admit: 2019-08-16 | Discharge: 2019-08-16 | Disposition: A | Discharge status: Home / Self Care | Payer: PPO | Source: Ambulatory Visit | Attending: Student | Admitting: Student

## 2019-08-16 ENCOUNTER — Other Ambulatory Visit: Payer: Self-pay

## 2019-08-16 DIAGNOSIS — M19012 Primary osteoarthritis, left shoulder: Secondary | ICD-10-CM | POA: Diagnosis not present

## 2019-08-16 DIAGNOSIS — M25412 Effusion, left shoulder: Secondary | ICD-10-CM | POA: Diagnosis not present

## 2019-08-16 DIAGNOSIS — S46012A Strain of muscle(s) and tendon(s) of the rotator cuff of left shoulder, initial encounter: Secondary | ICD-10-CM | POA: Insufficient documentation

## 2019-08-16 DIAGNOSIS — M75112 Incomplete rotator cuff tear or rupture of left shoulder, not specified as traumatic: Secondary | ICD-10-CM | POA: Diagnosis not present

## 2019-08-17 DIAGNOSIS — M9903 Segmental and somatic dysfunction of lumbar region: Secondary | ICD-10-CM | POA: Diagnosis not present

## 2019-08-17 DIAGNOSIS — M9904 Segmental and somatic dysfunction of sacral region: Secondary | ICD-10-CM | POA: Diagnosis not present

## 2019-08-17 DIAGNOSIS — M5442 Lumbago with sciatica, left side: Secondary | ICD-10-CM | POA: Diagnosis not present

## 2019-08-17 DIAGNOSIS — M461 Sacroiliitis, not elsewhere classified: Secondary | ICD-10-CM | POA: Diagnosis not present

## 2019-08-20 DIAGNOSIS — M7502 Adhesive capsulitis of left shoulder: Secondary | ICD-10-CM | POA: Diagnosis not present

## 2019-08-20 DIAGNOSIS — S46012D Strain of muscle(s) and tendon(s) of the rotator cuff of left shoulder, subsequent encounter: Secondary | ICD-10-CM | POA: Diagnosis not present

## 2019-08-26 DIAGNOSIS — M25512 Pain in left shoulder: Secondary | ICD-10-CM | POA: Diagnosis not present

## 2019-09-01 DIAGNOSIS — M25512 Pain in left shoulder: Secondary | ICD-10-CM | POA: Diagnosis not present

## 2019-09-02 DIAGNOSIS — M25512 Pain in left shoulder: Secondary | ICD-10-CM | POA: Diagnosis not present

## 2019-09-07 DIAGNOSIS — M25512 Pain in left shoulder: Secondary | ICD-10-CM | POA: Diagnosis not present

## 2019-09-09 DIAGNOSIS — M25512 Pain in left shoulder: Secondary | ICD-10-CM | POA: Diagnosis not present

## 2019-09-14 DIAGNOSIS — M25512 Pain in left shoulder: Secondary | ICD-10-CM | POA: Diagnosis not present

## 2019-09-16 DIAGNOSIS — M25512 Pain in left shoulder: Secondary | ICD-10-CM | POA: Diagnosis not present

## 2019-09-22 DIAGNOSIS — M25512 Pain in left shoulder: Secondary | ICD-10-CM | POA: Diagnosis not present

## 2019-09-23 DIAGNOSIS — M25512 Pain in left shoulder: Secondary | ICD-10-CM | POA: Diagnosis not present

## 2019-09-30 ENCOUNTER — Telehealth: Payer: Self-pay

## 2019-09-30 DIAGNOSIS — M7502 Adhesive capsulitis of left shoulder: Secondary | ICD-10-CM | POA: Diagnosis not present

## 2019-09-30 DIAGNOSIS — S46012D Strain of muscle(s) and tendon(s) of the rotator cuff of left shoulder, subsequent encounter: Secondary | ICD-10-CM | POA: Diagnosis not present

## 2019-09-30 NOTE — Telephone Encounter (Signed)
Patient is establishing care with a new dentist on Friday, and she has to take pre-meds prior to her dentist appt. Dr. Glori Bickers, can you refill this for her?

## 2019-09-30 NOTE — Telephone Encounter (Signed)
Pt notified of Dr. Marliss Coots comments. Pt will check with dentist tomorrow and let us know

## 2019-09-30 NOTE — Telephone Encounter (Signed)
This is no longer routinely recommended for mitral valve prolapse- her dentist may not require it any more (she may want to check on this)  If dentist still requires it I can fill

## 2019-10-08 ENCOUNTER — Other Ambulatory Visit: Payer: Self-pay | Admitting: Family Medicine

## 2019-10-08 DIAGNOSIS — Z1231 Encounter for screening mammogram for malignant neoplasm of breast: Secondary | ICD-10-CM

## 2019-10-12 DIAGNOSIS — M25512 Pain in left shoulder: Secondary | ICD-10-CM | POA: Diagnosis not present

## 2019-10-19 DIAGNOSIS — M25512 Pain in left shoulder: Secondary | ICD-10-CM | POA: Diagnosis not present

## 2019-10-23 ENCOUNTER — Emergency Department (HOSPITAL_COMMUNITY)
Admission: EM | Admit: 2019-10-23 | Discharge: 2019-10-23 | Disposition: A | Discharge status: Home / Self Care | Payer: PPO | Attending: Emergency Medicine | Admitting: Emergency Medicine

## 2019-10-23 ENCOUNTER — Emergency Department (HOSPITAL_COMMUNITY): Payer: PPO

## 2019-10-23 DIAGNOSIS — E039 Hypothyroidism, unspecified: Secondary | ICD-10-CM | POA: Insufficient documentation

## 2019-10-23 DIAGNOSIS — I491 Atrial premature depolarization: Secondary | ICD-10-CM | POA: Diagnosis not present

## 2019-10-23 DIAGNOSIS — R0602 Shortness of breath: Secondary | ICD-10-CM | POA: Diagnosis not present

## 2019-10-23 DIAGNOSIS — R42 Dizziness and giddiness: Secondary | ICD-10-CM | POA: Diagnosis not present

## 2019-10-23 DIAGNOSIS — R519 Headache, unspecified: Secondary | ICD-10-CM | POA: Insufficient documentation

## 2019-10-23 DIAGNOSIS — Z79899 Other long term (current) drug therapy: Secondary | ICD-10-CM | POA: Diagnosis not present

## 2019-10-23 DIAGNOSIS — R0902 Hypoxemia: Secondary | ICD-10-CM | POA: Diagnosis not present

## 2019-10-23 DIAGNOSIS — R61 Generalized hyperhidrosis: Secondary | ICD-10-CM | POA: Insufficient documentation

## 2019-10-23 DIAGNOSIS — Z87891 Personal history of nicotine dependence: Secondary | ICD-10-CM | POA: Insufficient documentation

## 2019-10-23 DIAGNOSIS — I10 Essential (primary) hypertension: Secondary | ICD-10-CM | POA: Diagnosis not present

## 2019-10-23 DIAGNOSIS — R55 Syncope and collapse: Secondary | ICD-10-CM | POA: Diagnosis not present

## 2019-10-23 DIAGNOSIS — R002 Palpitations: Secondary | ICD-10-CM | POA: Diagnosis not present

## 2019-10-23 LAB — CBC
HCT: 43.2 % (ref 36.0–46.0)
Hemoglobin: 13.9 g/dL (ref 12.0–15.0)
MCH: 28.3 pg (ref 26.0–34.0)
MCHC: 32.2 g/dL (ref 30.0–36.0)
MCV: 87.8 fL (ref 80.0–100.0)
Platelets: 349 10*3/uL (ref 150–400)
RBC: 4.92 MIL/uL (ref 3.87–5.11)
RDW: 13.2 % (ref 11.5–15.5)
WBC: 10 10*3/uL (ref 4.0–10.5)
nRBC: 0 % (ref 0.0–0.2)

## 2019-10-23 LAB — BASIC METABOLIC PANEL
Anion gap: 13 (ref 5–15)
BUN: 21 mg/dL (ref 8–23)
CO2: 23 mmol/L (ref 22–32)
Calcium: 9.6 mg/dL (ref 8.9–10.3)
Chloride: 102 mmol/L (ref 98–111)
Creatinine, Ser: 0.77 mg/dL (ref 0.44–1.00)
GFR, Estimated: >60 mL/min (ref >60)
Glucose, Bld: 113 mg/dL — ABNORMAL HIGH (ref 70–99)
Potassium: 3.4 mmol/L — ABNORMAL LOW (ref 3.5–5.1)
Sodium: 138 mmol/L (ref 135–145)

## 2019-10-23 LAB — TROPONIN I (HIGH SENSITIVITY)
Troponin I (High Sensitivity): 35 ng/L — ABNORMAL HIGH (ref <18)
Troponin I (High Sensitivity): 41 ng/L — ABNORMAL HIGH (ref <18)

## 2019-10-23 LAB — MAGNESIUM: Magnesium: 1.8 mg/dL (ref 1.7–2.4)

## 2019-10-23 MED ORDER — ACETAMINOPHEN 500 MG PO TABS
500.0000 mg | ORAL_TABLET | Freq: Once | ORAL | Status: AC
  Administered 2019-10-23: 500 mg via ORAL
  Filled 2019-10-23: qty 1

## 2019-10-23 NOTE — Discharge Instructions (Addendum)
Please follow-up with cardiology, schedule an appointment with them.  I want you to talk to them about your racing heart spells and your increased troponins. They will most likely put you on a Holter monitor as we discussed.  Please come back to the emergency department for any new or worsening concerning symptoms including worsening chest pain, chest pain that radiates or severe shortness of breath.  Get plenty of rest and stay hydrated. I hope you feel better!

## 2019-10-23 NOTE — ED Notes (Signed)
Patient verbalizes understanding of discharge instructions. Opportunity for questioning and answers were provided. Armband removed by staff, pt discharged from ED to home with husband. Aware of need to f/u with afib clinic

## 2019-10-23 NOTE — ED Notes (Signed)
Pt report HA, requesting tylenol, expressed need to provider, orders placed, provided

## 2019-10-23 NOTE — ED Provider Notes (Signed)
[facility] EMERGENCY DEPARTMENT Provider Note   CSN: [id number] Arrival date & time: 10/23/19  1423     History No chief complaint on file.   Robin Gilmore is a 69 y.o. female w/ pertinent past medical history of hyperlipidemia, hypothyroid, mitral valve prolapse that presents emergency department via EMS for dizziness, palpitations and diaphoresis.  Patient states that she has had 3 episodes like this since Sunday.States that symptoms about 5 minutes, today states that symptoms lasted for about 7 minutes.  States that symptoms were severe, had dizziness with palpitations and was sweating, states that symptoms disappeared without any intervention. No SOB. No room spinning sensation. States that she has had these symptoms for many years, has a spell about once a month, was concerned because she has never had three episodes in one week. States that symptoms have resolved, but now has some chest pain and a HA. Describes the chest pain as a "pricky feeling." Does not radiate anywhere, is dull and the center of the chest. Only started after he had a spell of palpitations and dizziness.  States that she generally does not have any chest pain.  No cardiac history.  Does not have cardiologist, never had a loop monitor. No paraesthesias, weakness, fevers, chills, nausea, vomiting. No alcohol use, no new medications. Has been compliant with thyroid medicine and has an appoint next week for this. States that she is generally healthy.     HPI     Past Medical History:  Diagnosis Date  . Constipation   . Hyperlipidemia   . Hypothyroid   . IBS (irritable bowel syndrome)   . Menopausal syndrome   . Mitral prolapse and regurge used to have prophylaxis  . Obesity     Patient Active Problem List   Diagnosis Date Noted  . Essential hypertension 11/10/2018  . Elevated glucose level 11/10/2018  . Mitral valve prolapse 11/04/2017  . Estrogen deficiency 08/09/2015  . Obesity  11/18/2013  . Encounter for routine gynecological examination 06/30/2012  . Routine general medical examination at a health care facility 01/29/2011  . Family history of colonic polyps 01/29/2011  . Hypothyroidism 05/16/2009  . Hyperlipidemia LDL goal <130 05/16/2009  . CYSTOCELE WITHOUT MENTION UTERINE PROLAPSE LAT 05/16/2009    Past Surgical History:  Procedure Laterality Date  . BREAST EXCISIONAL BIOPSY Right   . BREAST SURGERY     breast biopsy x 2 benign  . CESAREAN SECTION    . retrovaginal fistula repair       OB History   No obstetric history on file.     Family History  Problem Relation Age of Onset  . Alcohol abuse Mother   . Diabetes Mother   . Heart disease Mother        ? MI CAD  . Cancer Father        lung CA  . Alcohol abuse Father   . Hypertension Sister   . Hyperlipidemia Sister   . Alcohol abuse Brother   . Colon polyps Brother   . Obesity Maternal Grandfather   . Breast cancer Neg Hx     Social History   Tobacco Use  . Smoking status: Former Research scientist (life sciences)  . Smokeless tobacco: Never Used  Vaping Use  . Vaping Use: Never used  Substance Use Topics  . Alcohol use: No    Alcohol/week: 0.0 standard drinks  . Drug use: No    Home Medications Prior to Admission medications   Medication Sig Start Date  End Date Taking? Authorizing Provider  albuterol (PROVENTIL HFA;VENTOLIN HFA) 108 (90 Base) MCG/ACT inhaler Inhale 2 puffs into the lungs every 6 (six) hours as needed for wheezing or shortness of breath. 08/15/16   Tower, Wynelle Fanny, MD  amLODipine (NORVASC) 5 MG tablet Take 1 tablet (5 mg total) by mouth daily. 01/12/19   Tower, Wynelle Fanny, MD  amoxicillin (AMOXIL) 500 MG capsule TAKE FOUR CAPSULES BY MOUTH ONE HOUR BEFORE APPOINTMENT 10/30/17   [provider]  Calcium Carbonate-Vit D-Min 600-400 MG-UNIT TABS Take 1 tablet by mouth 2 (two) times daily.    [provider]  chlorpheniramine (CHLOR-TRIMETON) 4 MG tablet Take 1 tablet (4 mg  total) by mouth 2 (two) times daily as needed for allergies. 07/12/14   Tonia Ghent, MD  Dextromethorphan-Guaifenesin (ROBITUSSIN DM PO) Take by mouth as needed.    [provider]  levothyroxine (SYNTHROID) 150 MCG tablet Take 1 tablet (150 mcg total) by mouth daily. 11/10/18   Tower, Wynelle Fanny, MD    Allergies    Codeine  Review of Systems   Review of Systems  Constitutional: Positive for diaphoresis. Negative for chills, fatigue and fever.  HENT: Negative for congestion, sore throat and trouble swallowing.   Eyes: Negative for pain and visual disturbance.  Respiratory: Negative for cough, chest tightness, shortness of breath and wheezing.   Cardiovascular: Positive for chest pain and palpitations. Negative for leg swelling.  Gastrointestinal: Negative for abdominal distention, abdominal pain, diarrhea, nausea and vomiting.  Genitourinary: Negative for difficulty urinating.  Musculoskeletal: Negative for back pain, neck pain and neck stiffness.  Skin: Negative for pallor.  Neurological: Positive for dizziness and light-headedness. Negative for tremors, seizures, syncope, facial asymmetry, speech difficulty, weakness, numbness and headaches.  Psychiatric/Behavioral: Negative for confusion.    Physical Exam Updated Vital Signs BP (!) 163/74   Pulse 73   Temp 98 F (36.7 C) (Oral)   Resp (!) 24   SpO2 99%   Physical Exam Constitutional:      General: She is not in acute distress.    Appearance: Normal appearance. She is not ill-appearing, toxic-appearing or diaphoretic.  HENT:     Mouth/Throat:     Mouth: Mucous membranes are moist.     Pharynx: Oropharynx is clear.  Eyes:     General: No scleral icterus.    Extraocular Movements: Extraocular movements intact.     Pupils: Pupils are equal, round, and reactive to light.  Cardiovascular:     Rate and Rhythm: Normal rate and regular rhythm.     Pulses: Normal pulses.     Heart sounds: Normal heart sounds.    Pulmonary:     Effort: Pulmonary effort is normal. No respiratory distress.     Breath sounds: Normal breath sounds. No stridor. No wheezing, rhonchi or rales.  Chest:     Chest wall: No tenderness.  Abdominal:     General: Abdomen is flat. There is no distension.     Palpations: Abdomen is soft.     Tenderness: There is no abdominal tenderness. There is no guarding or rebound.  Musculoskeletal:        General: No swelling or tenderness. Normal range of motion.     Cervical back: Normal range of motion and neck supple. No rigidity.     Right lower leg: No edema.     Left lower leg: No edema.  Skin:    General: Skin is warm and dry.     Capillary Refill: Capillary  refill takes less than 2 seconds.     Coloration: Skin is not pale.  Neurological:     General: No focal deficit present.     Mental Status: She is alert and oriented to person, place, and time.     Comments: Alert. Clear speech. No facial droop. CNIII-XII grossly intact. Bilateral upper and lower extremities' sensation grossly intact. 5/5 symmetric strength with grip strength and with plantar and dorsi flexion bilaterally. Normal finger to nose bilaterally. Negative pronator drift. Negative Romberg sign. Gait is steady and intact    Psychiatric:        Mood and Affect: Mood normal.        Behavior: Behavior normal.     ED Results / Procedures / Treatments   Labs (all labs ordered are listed, but only abnormal results are displayed) Labs Reviewed  BASIC METABOLIC PANEL - Abnormal; Notable for the following components:      Result Value   Potassium 3.4 (*)    Glucose, Bld 113 (*)    All other components within normal limits  TROPONIN I (HIGH SENSITIVITY) - Abnormal; Notable for the following components:   Troponin I (High Sensitivity) 35 (*)    All other components within normal limits  TROPONIN I (HIGH SENSITIVITY) - Abnormal; Notable for the following components:   Troponin I (High Sensitivity) 41 (*)    All  other components within normal limits  MAGNESIUM  CBC    EKG EKG Interpretation  Date/Time:  Friday October 23 2019 15:29:49 EDT Ventricular Rate:  84 PR Interval:    QRS Duration: 96 QT Interval:  354 QTC Calculation: 419 R Axis:   59 Text Interpretation: Sinus rhythm Minimal ST depression, lateral leads Confirmed by Sherwood Gambler [phone]) on 10/23/2019 3:31:59 PM   Radiology DG Chest Port 1 View  Result Date: 10/23/2019 CLINICAL DATA:  Shortness of breath EXAM: PORTABLE CHEST 1 VIEW COMPARISON:  None. FINDINGS: Cardiac shadow is within normal limits. Mild aortic calcifications are seen. The lungs are clear. No bony abnormality is noted. IMPRESSION: No active disease. Electronically Signed   By: Inez Catalina M.D.   On: 10/23/2019 16:00    Procedures Procedures (including critical care time)  Medications Ordered in ED Medications  acetaminophen (TYLENOL) tablet 500 mg (500 mg Oral Given 10/23/19 1835)    ED Course  I have reviewed the triage vital signs and the nursing notes.  Pertinent labs & imaging results that were available during my care of the patient were reviewed by me and considered in my medical decision making (see chart for details).    MDM Rules/Calculators/A&P                         Robin Gilmore is a 69 y.o. female will involve medical history of hyperlipidemia, hypothyroid, mitral valve prolapse that presents emergency department via EMS for dizziness, palpitations and diaphoresis.  On my exam patient appears very well, sitting without any acute distress. No diaphoresis.  Patient states that symptoms have resolved. A. Fib likely, however EKG normal sinus rhythm.  Multiple EKGs and-monitor multiple times with no signs of A. fib.  Patient has never had Holter monitor before. Stable vitals.   Work-up today reassuring with normal BMP and CBC.  First troponin 35, second troponin 41.  Patient feels better with Tylenol on board.  Patient to be discharged, will  have patient follow-up with cardiology for most likely need for Holter monitor.  No concerns  for ACS, dissection, PE. PERC positive due to age, Wells score 0.  Chest x-ray interpreted me without any acute cardiopulmonary disease.  EKG interpreted me without any signs of ischemia.  Patient to be discharged. Pt has been advised to return to the ED if CP becomes exertional, associated with diaphoresis or nausea, radiates to left jaw/arm, worsens or becomes concerning in any way. Pt appears reliable for follow up and is agreeable to discharge.    Doubt need for further emergent work up at this time. I explained the diagnosis and have given explicit precautions to return to the ER including for any other new or worsening symptoms. The patient understands and accepts the medical plan as it's been dictated and I have answered their questions. Discharge instructions concerning home care and prescriptions have been given. The patient is STABLE and is discharged to home in good condition.  I discussed this case with my attending physician who cosigned this note including patient's presenting symptoms, physical exam, and planned diagnostics and interventions. Attending physician stated agreement with plan or made changes to plan which were implemented.   Attending physician assessed patient at bedside.  Final Clinical Impression(s) / ED Diagnoses Final diagnoses:  Palpitations    Rx / DC Orders ED Discharge Orders         Ordered    Amb referral to AFIB Clinic  Status:  Canceled        10/23/19 [place], PA-C 10/23/19 2213    Drenda Freeze, MD 10/28/19 [phone]

## 2019-10-23 NOTE — ED Triage Notes (Addendum)
Pt presents with EMS from work for dizziness, palpitations, diaphoresis. Has had 3 episodes since Sunday. H/o mitral valve prolapse, htn, hypothyroidism. EMS exam : NSR with pvcs, A&Ox4, CBG154, warm/dry. Denies SOB, CP, syncope. BP 190/100, 172/98, HR 100, 96% RA

## 2019-10-26 DIAGNOSIS — M25512 Pain in left shoulder: Secondary | ICD-10-CM | POA: Diagnosis not present

## 2019-10-27 ENCOUNTER — Ambulatory Visit: Payer: PPO

## 2019-10-28 ENCOUNTER — Telehealth: Payer: Self-pay | Admitting: Radiology

## 2019-10-28 ENCOUNTER — Ambulatory Visit: Payer: PPO | Admitting: Cardiology

## 2019-10-28 ENCOUNTER — Other Ambulatory Visit: Payer: Self-pay

## 2019-10-28 ENCOUNTER — Encounter: Payer: Self-pay | Admitting: Cardiology

## 2019-10-28 VITALS — BP 160/80 | HR 74 | Ht 67.5 in | Wt 192.0 lb

## 2019-10-28 DIAGNOSIS — Z8249 Family history of ischemic heart disease and other diseases of the circulatory system: Secondary | ICD-10-CM | POA: Diagnosis not present

## 2019-10-28 DIAGNOSIS — R7989 Other specified abnormal findings of blood chemistry: Secondary | ICD-10-CM

## 2019-10-28 DIAGNOSIS — R778 Other specified abnormalities of plasma proteins: Secondary | ICD-10-CM | POA: Diagnosis not present

## 2019-10-28 DIAGNOSIS — R0789 Other chest pain: Secondary | ICD-10-CM | POA: Diagnosis not present

## 2019-10-28 DIAGNOSIS — I2489 Other forms of acute ischemic heart disease: Secondary | ICD-10-CM

## 2019-10-28 DIAGNOSIS — I10 Essential (primary) hypertension: Secondary | ICD-10-CM

## 2019-10-28 DIAGNOSIS — R011 Cardiac murmur, unspecified: Secondary | ICD-10-CM | POA: Diagnosis not present

## 2019-10-28 DIAGNOSIS — I248 Other forms of acute ischemic heart disease: Secondary | ICD-10-CM | POA: Diagnosis not present

## 2019-10-28 DIAGNOSIS — R002 Palpitations: Secondary | ICD-10-CM | POA: Diagnosis not present

## 2019-10-28 DIAGNOSIS — R9431 Abnormal electrocardiogram [ECG] [EKG]: Secondary | ICD-10-CM

## 2019-10-28 MED ORDER — METOPROLOL SUCCINATE ER 50 MG PO TB24: 50.0000 mg | ORAL_TABLET | Freq: Every day | ORAL | 3 refills | Status: DC

## 2019-10-28 MED ORDER — METOPROLOL TARTRATE 50 MG PO TABS: ORAL_TABLET | ORAL | 0 refills | Status: DC

## 2019-10-28 NOTE — Telephone Encounter (Signed)
Enrolled patient for a 14 day Zio XT  monitor to be mailed to patients home  °

## 2019-10-28 NOTE — Progress Notes (Signed)
Cardiology Office Note:    Date:  10/28/2019   ID:  Robin Gilmore, DOB Jun 28, 1950, MRN [id number]  PCP:  Robin Greenspan, MD  [facility] Cardiologist:  Candee Furbish, MD  [facility] Electrophysiologist:  None   Referring MD: Robin Greenspan, MD     History of Present Illness:    Robin Gilmore is a 69 y.o. female here for the evaluation of palpitations at the request of Dr. Glori Bickers.  She was seen in the emergency department on 10/23/2019.  Notes reviewed.  She presented with palpitations and diaphoresis.  Over the last several days she has been feeling this.  This episode lasted longer and prompted her come to the emergency room.  Troponins were normal.  Had 3 episodes that week. Heart racing, pain under breast, chest pressure to back. BP was elevated.   Usual had for years 5 min. Had worn heart monitor prior years ago.   Cofee 3 times a day.   Mother CABG  Past Medical History:  Diagnosis Date  . Constipation   . Hyperlipidemia   . Hypothyroid   . IBS (irritable bowel syndrome)   . Menopausal syndrome   . Mitral prolapse and regurge used to have prophylaxis  . Obesity     Past Surgical History:  Procedure Laterality Date  . BREAST EXCISIONAL BIOPSY Right   . BREAST SURGERY     breast biopsy x 2 benign  . CESAREAN SECTION    . retrovaginal fistula repair      Current Medications: Current Meds  Medication Sig  . albuterol (PROVENTIL HFA;VENTOLIN HFA) 108 (90 Base) MCG/ACT inhaler Inhale 2 puffs into the lungs every 6 (six) hours as needed for wheezing or shortness of breath.  Marland Kitchen amLODipine (NORVASC) 5 MG tablet Take 1 tablet (5 mg total) by mouth daily.  Marland Kitchen gabapentin (NEURONTIN) 300 MG capsule Take 300 mg by mouth as needed.  Marland Kitchen levothyroxine (SYNTHROID) 150 MCG tablet Take 1 tablet (150 mcg total) by mouth daily.     Allergies:   Codeine   Social History   Socioeconomic History  . Marital status: Married    Spouse name: Not on file  . Number of  children: Not on file  . Years of education: Not on file  . Highest education level: Not on file  Occupational History  . Not on file  Tobacco Use  . Smoking status: Former Research scientist (life sciences)  . Smokeless tobacco: Never Used  Vaping Use  . Vaping Use: Never used  Substance and Sexual Activity  . Alcohol use: No    Alcohol/week: 0.0 standard drinks  . Drug use: No  . Sexual activity: Never  Other Topics Concern  . Not on file  Social History Narrative  . Not on file   Social Determinants of Health   Financial Resource Strain: Low Risk   . Difficulty of Paying Living Expenses: Not hard at all  Food Insecurity: No Food Insecurity  . Worried About Charity fundraiser in the Last Year: Never true  . Ran Out of Food in the Last Year: Never true  Transportation Needs: No Transportation Needs  . Lack of Transportation (Medical): No  . Lack of Transportation (Non-Medical): No  Physical Activity: Inactive  . Days of Exercise per Week: 0 days  . Minutes of Exercise per Session: 0 min  Stress: No Stress Concern Present  . Feeling of Stress : Not at all  Social Connections:   . Frequency of Communication with  Friends and Family: Not on file  . Frequency of Social Gatherings with Friends and Family: Not on file  . Attends Religious Services: Not on file  . Active Member of Clubs or Organizations: Not on file  . Attends Archivist Meetings: Not on file  . Marital Status: Not on file     Family History: The patient's family history includes Alcohol abuse in her brother, father, and mother; Cancer in her father; Colon polyps in her brother; Diabetes in her mother; Heart disease in her mother; Hyperlipidemia in her sister; Hypertension in her sister; Obesity in her maternal grandfather. There is no history of Breast cancer.  ROS:   Please see the history of present illness.    Denies any syncope bleeding orthopnea PND all other systems reviewed and are negative.  EKGs/Labs/Other  Studies Reviewed:    The following studies were reviewed today: ER visit lab work chest x-ray unremarkable  EKG: 10/23/2019-sinus rhythm with T wave inversion in the lateral leads.  Heart rate 81 bpm.  Recent Labs: 11/03/2018: ALT 22 12/30/2018: TSH 0.77 10/23/2019: BUN 21; Creatinine, Ser 0.77; Hemoglobin 13.9; Magnesium 1.8; Platelets 349; Potassium 3.4; Sodium 138  Recent Lipid Panel    Component Value Date/Time   CHOL 193 11/03/2018 0840   TRIG 234.0 (H) 11/03/2018 0840   HDL 47.00 11/03/2018 0840   CHOLHDL 4 11/03/2018 0840   VLDL 46.8 (H) 11/03/2018 0840   LDLCALC 98 08/02/2015 0807   LDLDIRECT 90.0 11/03/2018 0840     Risk Assessment/Calculations:       Physical Exam:    VS:  BP (!) 160/80   Pulse 74   Ht 5' 7.5" (1.715 m)   Wt 192 lb (87.1 kg)   SpO2 98%   BMI 29.63 kg/m        GEN:  Well nourished, well developed in no acute distress HEENT: Normal NECK: No JVD; No carotid bruits LYMPHATICS: No lymphadenopathy CARDIAC: RRR, 2/6  murmurs, rubs, gallops RESPIRATORY:  Clear to auscultation without rales, wheezing or rhonchi  ABDOMEN: Soft, non-tender, non-distended MUSCULOSKELETAL:  No edema; No deformity  SKIN: Warm and dry NEUROLOGIC:  Alert and oriented x 3 PSYCHIATRIC:  Normal affect   ASSESSMENT:    1. Palpitations   2. Essential hypertension   3. Other chest pain   4. Elevated troponin   5. Demand ischemia ([place])   6. Nonspecific abnormal electrocardiogram (ECG) (EKG)   7. Heart murmur   8. Family history of early CAD    PLAN:    In order of problems listed above:  Palpitations -Sounds like potential SVT.  Could to be atrial fibrillation?.  We will go ahead and check a Zio patch monitor for 14 days.  Recent thyroid test were normal.  Electrolytes were normal in the ER.  I will go ahead and prescribe Toprol-XL 50 mg once a day.  Mildly elevated troponin/chest pain/family history of CAD -Occurred in the setting of her prolonged  SVT/palpitation episode/racing heartbeat.  Troponin was 41.  EKG did show some T wave inversion in the lateral leads.  Abnormal.  Pain was below her breast, radiated to her back, moderate in intensity.  Her mother had bypass surgery.  She was a former smoker the quit at age 60.  Given these factors, I would like to check a coronary CT scan with possible FFR analysis. -LDL 90  Heart murmur -Checking an echocardiogram.  Could be an aortic murmur. -Has been told in the past that she had  mitral valve prolapse.    We will see her back in 3 months for follow-up.  She knows to contact us if any further worrisome symptoms develop.  Medication Adjustments/Labs and Tests Ordered: Current medicines are reviewed at length with the patient today.  Concerns regarding medicines are outlined above.  Orders Placed This Encounter  Procedures  . CT CORONARY MORPH W/CTA COR W/SCORE W/CA W/CM &/OR WO/CM  . CT CORONARY FRACTIONAL FLOW RESERVE DATA PREP  . CT CORONARY FRACTIONAL FLOW RESERVE FLUID ANALYSIS  . Basic metabolic panel  . LONG TERM MONITOR (3-14 DAYS)  . ECHOCARDIOGRAM COMPLETE   Meds ordered this encounter  Medications  . metoprolol succinate (TOPROL-XL) 50 MG 24 hr tablet    Sig: Take 1 tablet (50 mg total) by mouth daily. Take with or immediately following a meal.    Dispense:  90 tablet    Refill:  3  . metoprolol tartrate (LOPRESSOR) 50 MG tablet    Sig: Take one tablet by mouth 2 hours prior to CT scan    Dispense:  1 tablet    Refill:  0    Patient Instructions  Medication Instructions:  Your physician has recommended you make the following change in your medication:  1-START Metoprolol 50 mg by mouth daily.  *If you need a refill on your cardiac medications before your next appointment, please call your pharmacy*   Lab Work: If you have labs (blood work) drawn today and your tests are completely normal, you will receive your results only by: Marland Kitchen MyChart Message (if you have  MyChart) OR . A paper copy in the mail If you have any lab test that is abnormal or we need to change your treatment, we will call you to review the results.   Testing/Procedures: Your physician has requested that you have an echocardiogram. Echocardiography is a painless test that uses sound waves to create images of your heart. It provides your doctor with information about the size and shape of your heart and how well your heart's chambers and valves are working. This procedure takes approximately one hour. There are no restrictions for this procedure.  ZIO XT- Long Term Monitor Instructions   Your physician has requested you wear your ZIO patch monitor____14___days.   This is a single patch monitor.  Irhythm supplies one patch monitor per enrollment.  Additional stickers are not available.   Please do not apply patch if you will be having a Nuclear Stress Test, Echocardiogram, Cardiac CT, MRI, or Chest Xray during the time frame you would be wearing the monitor. The patch cannot be worn during these tests.  You cannot remove and re-apply the ZIO XT patch monitor.   Your ZIO patch monitor will be sent USPS Priority mail from [facility] directly to your home address. The monitor may also be mailed to a PO BOX if home delivery is not available.   It may take 3-5 days to receive your monitor after you have been enrolled.   Once you have received you monitor, please review enclosed instructions.  Your monitor has already been registered assigning a specific monitor serial # to you.   Applying the monitor   Shave hair from upper left chest.   Hold abrader disc by orange tab.  Rub abrader in 40 strokes over left upper chest as indicated in your monitor instructions.   Clean area with 4 enclosed alcohol pads .  Use all pads to assure are is cleaned thoroughly.  Let dry.  Apply patch as indicated in monitor instructions.  Patch will be place under collarbone on left side of chest  with arrow pointing upward.   Rub patch adhesive wings for 2 minutes.Remove white label marked "1".  Remove white label marked "2".  Rub patch adhesive wings for 2 additional minutes.   While looking in a mirror, press and release button in center of patch.  A small green light will flash 3-4 times .  This will be your only indicator the monitor has been turned on.     Do not shower for the first 24 hours.  You may shower after the first 24 hours.   Press button if you feel a symptom. You will hear a small click.  Record Date, Time and Symptom in the Patient Log Book.   When you are ready to remove patch, follow instructions on last 2 pages of Patient Log Book.  Stick patch monitor onto last page of Patient Log Book.   Place Patient Log Book in [place] box.  Use locking tab on box and tape box closed securely.  The [employer] has [employer] on it.  Please place in mailbox as soon as possible.  Your physician should have your test results approximately 7 days after the monitor has been mailed back to [facility].   Call [place] at [phone] if you have questions regarding your ZIO XT patch monitor.  Call them immediately if you see an orange light blinking on your monitor.   If your monitor falls off in less than 4 days contact our Monitor department at [phone].  If your monitor becomes loose or falls off after 4 days call [employer] at [phone] for suggestions on securing your monitor.    Follow-Up: At [facility], you and your health needs are our priority.  As part of our continuing mission to provide you with exceptional heart care, we have created designated Provider Care Teams.  These Care Teams include your primary Cardiologist (physician) and Advanced Practice Providers (APPs -  Physician Assistants and Nurse Practitioners) who all work together to provide you with the care you need, when you need it.  We recommend signing up for the  patient portal called "MyChart".  Sign up information is provided on this After Visit Summary.  MyChart is used to connect with patients for Virtual Visits (Telemedicine).  Patients are able to view lab/test results, encounter notes, upcoming appointments, etc.  Non-urgent messages can be sent to your provider as well.   To learn more about what you can do with MyChart, go to [email or web address].    Your next appointment:   3 months  The format for your next appointment:   In Person  Provider:   You may see Candee Furbish, MD or one of the following Advanced Practice Providers on your designated Care Team:    Truitt Merle, NP  Cecilie Kicks, NP  Kathyrn Drown, NP    Other Instructions Your cardiac CT will be scheduled at one of the below locations:   [facility] [address] [place], [place] [address] [phone]  If scheduled at [facility], please arrive at the [facility] main entrance of [facility] 30 minutes prior to test start time. Proceed to the [facility] Radiology Department (first floor) to check-in and test prep.  Please follow these instructions carefully (unless otherwise directed):   On the Night Before the Test: . Be sure to Drink plenty of  water. . Do not consume any caffeinated/decaffeinated beverages or chocolate 12 hours prior to your test. . Do not take any antihistamines 12 hours prior to your test.  On the Day of the Test: . Drink plenty of water. Do not drink any water within one hour of the test. . Do not eat any food 4 hours prior to the test. . You may take your regular medications prior to the test.  . Take metoprolol (Lopressor) 50 mg two hours prior to test. . FEMALES- please wear underwire-free bra if available  After the Test: . Drink plenty of water. . After receiving IV contrast, you may experience a mild flushed feeling. This is normal. . On occasion, you may experience a mild rash up to 24 hours  after the test. This is not dangerous. If this occurs, you can take Benadryl 25 mg and increase your fluid intake. . If you experience trouble breathing, this can be serious. If it is severe call 911 IMMEDIATELY. If it is mild, please call our office.    Once we have confirmed authorization from your insurance company, we will call you to set up a date and time for your test. Based on how quickly your insurance processes prior authorizations requests, please allow up to 4 weeks to be contacted for scheduling your Cardiac CT appointment. Be advised that routine Cardiac CT appointments could be scheduled as many as 8 weeks after your provider has ordered it.  For non-scheduling related questions, please contact the cardiac imaging nurse navigator should you have any questions/concerns: Marchia Bond, Cardiac Imaging Nurse Navigator Burley Saver, Interim Cardiac Imaging Nurse [place] and Vascular Services Direct Office Dial: [phone]   For scheduling needs, including cancellations and rescheduling, please call Vivien Rota at [phone], option 3.        Signed, Candee Furbish, MD  10/28/2019 5:10 PM    [facility]

## 2019-10-28 NOTE — Patient Instructions (Signed)
Medication Instructions:  Your physician has recommended you make the following change in your medication:  1-START Metoprolol 50 mg by mouth daily.  *If you need a refill on your cardiac medications before your next appointment, please call your pharmacy*   Lab Work: If you have labs (blood work) drawn today and your tests are completely normal, you will receive your results only by: Marland Kitchen MyChart Message (if you have MyChart) OR . A paper copy in the mail If you have any lab test that is abnormal or we need to change your treatment, we will call you to review the results.   Testing/Procedures: Your physician has requested that you have an echocardiogram. Echocardiography is a painless test that uses sound waves to create images of your heart. It provides your doctor with information about the size and shape of your heart and how well your heart's chambers and valves are working. This procedure takes approximately one hour. There are no restrictions for this procedure.  ZIO XT- Long Term Monitor Instructions   Your physician has requested you wear your ZIO patch monitor____14___days.   This is a single patch monitor.  Irhythm supplies one patch monitor per enrollment.  Additional stickers are not available.   Please do not apply patch if you will be having a Nuclear Stress Test, Echocardiogram, Cardiac CT, MRI, or Chest Xray during the time frame you would be wearing the monitor. The patch cannot be worn during these tests.  You cannot remove and re-apply the ZIO XT patch monitor.   Your ZIO patch monitor will be sent USPS Priority mail from [facility] directly to your home address. The monitor may also be mailed to a PO BOX if home delivery is not available.   It may take 3-5 days to receive your monitor after you have been enrolled.   Once you have received you monitor, please review enclosed instructions.  Your monitor has already been registered assigning a specific monitor  serial # to you.   Applying the monitor   Shave hair from upper left chest.   Hold abrader disc by orange tab.  Rub abrader in 40 strokes over left upper chest as indicated in your monitor instructions.   Clean area with 4 enclosed alcohol pads .  Use all pads to assure are is cleaned thoroughly.  Let dry.   Apply patch as indicated in monitor instructions.  Patch will be place under collarbone on left side of chest with arrow pointing upward.   Rub patch adhesive wings for 2 minutes.Remove white label marked "1".  Remove white label marked "2".  Rub patch adhesive wings for 2 additional minutes.   While looking in a mirror, press and release button in center of patch.  A small green light will flash 3-4 times .  This will be your only indicator the monitor has been turned on.     Do not shower for the first 24 hours.  You may shower after the first 24 hours.   Press button if you feel a symptom. You will hear a small click.  Record Date, Time and Symptom in the Patient Log Book.   When you are ready to remove patch, follow instructions on last 2 pages of Patient Log Book.  Stick patch monitor onto last page of Patient Log Book.   Place Patient Log Book in [place] box.  Use locking tab on box and tape box closed securely.  The [place] and [employer] has [employer] on  it.  Please place in mailbox as soon as possible.  Your physician should have your test results approximately 7 days after the monitor has been mailed back to [facility].   Call [place] at [phone] if you have questions regarding your ZIO XT patch monitor.  Call them immediately if you see an orange light blinking on your monitor.   If your monitor falls off in less than 4 days contact our Monitor department at [phone].  If your monitor becomes loose or falls off after 4 days call [employer] at [phone] for suggestions on securing your monitor.    Follow-Up: At [facility], you and  your health needs are our priority.  As part of our continuing mission to provide you with exceptional heart care, we have created designated Provider Care Teams.  These Care Teams include your primary Cardiologist (physician) and Advanced Practice Providers (APPs -  Physician Assistants and Nurse Practitioners) who all work together to provide you with the care you need, when you need it.  We recommend signing up for the patient portal called "MyChart".  Sign up information is provided on this After Visit Summary.  MyChart is used to connect with patients for Virtual Visits (Telemedicine).  Patients are able to view lab/test results, encounter notes, upcoming appointments, etc.  Non-urgent messages can be sent to your provider as well.   To learn more about what you can do with MyChart, go to [email or web address].    Your next appointment:   3 months  The format for your next appointment:   In Person  Provider:   You may see Candee Furbish, MD or one of the following Advanced Practice Providers on your designated Care Team:    Truitt Merle, NP  Cecilie Kicks, NP  Kathyrn Drown, NP    Other Instructions Your cardiac CT will be scheduled at one of the below locations:   [facility] [address] [place], [place] [address] [phone]  If scheduled at [facility], please arrive at the [facility] main entrance of [place] 30 minutes prior to test start time. Proceed to the [facility] Radiology Department (first floor) to check-in and test prep.  Please follow these instructions carefully (unless otherwise directed):   On the Night Before the Test: . Be sure to Drink plenty of water. . Do not consume any caffeinated/decaffeinated beverages or chocolate 12 hours prior to your test. . Do not take any antihistamines 12 hours prior to your test.  On the Day of the Test: . Drink plenty of water. Do not drink any water within one hour of the  test. . Do not eat any food 4 hours prior to the test. . You may take your regular medications prior to the test.  . Take metoprolol (Lopressor) 50 mg two hours prior to test. . FEMALES- please wear underwire-free bra if available  After the Test: . Drink plenty of water. . After receiving IV contrast, you may experience a mild flushed feeling. This is normal. . On occasion, you may experience a mild rash up to 24 hours after the test. This is not dangerous. If this occurs, you can take Benadryl 25 mg and increase your fluid intake. . If you experience trouble breathing, this can be serious. If it is severe call 911 IMMEDIATELY. If it is mild, please call our office.    Once we have confirmed authorization from your insurance company, we will call you to set up  a date and time for your test. Based on how quickly your insurance processes prior authorizations requests, please allow up to 4 weeks to be contacted for scheduling your Cardiac CT appointment. Be advised that routine Cardiac CT appointments could be scheduled as many as 8 weeks after your provider has ordered it.  For non-scheduling related questions, please contact the cardiac imaging nurse navigator should you have any questions/concerns: Marchia Bond, Cardiac Imaging Nurse Navigator Burley Saver, Interim Cardiac Imaging Nurse [place] and Vascular Services Direct Office Dial: [phone]   For scheduling needs, including cancellations and rescheduling, please call Vivien Rota at [phone], option 3.

## 2019-10-30 ENCOUNTER — Ambulatory Visit (INDEPENDENT_AMBULATORY_CARE_PROVIDER_SITE_OTHER): Payer: PPO

## 2019-10-30 DIAGNOSIS — R002 Palpitations: Secondary | ICD-10-CM | POA: Diagnosis not present

## 2019-10-30 DIAGNOSIS — R0789 Other chest pain: Secondary | ICD-10-CM

## 2019-10-30 DIAGNOSIS — R778 Other specified abnormalities of plasma proteins: Secondary | ICD-10-CM

## 2019-10-30 DIAGNOSIS — R9431 Abnormal electrocardiogram [ECG] [EKG]: Secondary | ICD-10-CM

## 2019-10-30 DIAGNOSIS — Z8249 Family history of ischemic heart disease and other diseases of the circulatory system: Secondary | ICD-10-CM

## 2019-10-30 DIAGNOSIS — R011 Cardiac murmur, unspecified: Secondary | ICD-10-CM

## 2019-10-30 DIAGNOSIS — I10 Essential (primary) hypertension: Secondary | ICD-10-CM

## 2019-10-30 DIAGNOSIS — I248 Other forms of acute ischemic heart disease: Secondary | ICD-10-CM

## 2019-11-02 DIAGNOSIS — M25512 Pain in left shoulder: Secondary | ICD-10-CM | POA: Diagnosis not present

## 2019-11-05 ENCOUNTER — Ambulatory Visit: Admission: RE | Admit: 2019-11-05 | Payer: PPO | Source: Ambulatory Visit

## 2019-11-09 DIAGNOSIS — M25512 Pain in left shoulder: Secondary | ICD-10-CM | POA: Diagnosis not present

## 2019-11-16 DIAGNOSIS — M25512 Pain in left shoulder: Secondary | ICD-10-CM | POA: Diagnosis not present

## 2019-11-17 ENCOUNTER — Telehealth (HOSPITAL_COMMUNITY): Payer: Self-pay | Admitting: Emergency Medicine

## 2019-11-17 NOTE — Telephone Encounter (Signed)
Reaching out to patient to offer assistance regarding upcoming cardiac imaging study; pt verbalizes understanding of appt date/time, parking situation and where to check in, pre-test NPO status and medications ordered, and verified current allergies; name and call back number provided for further questions should they arise Analuisa Tudor RN Navigator Cardiac Imaging [facility] [phone] office [phone] cell 

## 2019-11-18 ENCOUNTER — Ambulatory Visit (HOSPITAL_COMMUNITY): Payer: PPO | Attending: Cardiovascular Disease

## 2019-11-18 ENCOUNTER — Ambulatory Visit
Admission: RE | Admit: 2019-11-18 | Discharge: 2019-11-18 | Disposition: A | Discharge status: Home / Self Care | Payer: PPO | Source: Ambulatory Visit | Attending: Family Medicine | Admitting: Family Medicine

## 2019-11-18 ENCOUNTER — Other Ambulatory Visit: Payer: Self-pay

## 2019-11-18 DIAGNOSIS — R9431 Abnormal electrocardiogram [ECG] [EKG]: Secondary | ICD-10-CM | POA: Diagnosis not present

## 2019-11-18 DIAGNOSIS — I248 Other forms of acute ischemic heart disease: Secondary | ICD-10-CM | POA: Diagnosis not present

## 2019-11-18 DIAGNOSIS — R0789 Other chest pain: Secondary | ICD-10-CM | POA: Insufficient documentation

## 2019-11-18 DIAGNOSIS — R011 Cardiac murmur, unspecified: Secondary | ICD-10-CM

## 2019-11-18 DIAGNOSIS — R778 Other specified abnormalities of plasma proteins: Secondary | ICD-10-CM | POA: Diagnosis not present

## 2019-11-18 DIAGNOSIS — Z8249 Family history of ischemic heart disease and other diseases of the circulatory system: Secondary | ICD-10-CM | POA: Insufficient documentation

## 2019-11-18 DIAGNOSIS — I10 Essential (primary) hypertension: Secondary | ICD-10-CM | POA: Diagnosis not present

## 2019-11-18 DIAGNOSIS — R002 Palpitations: Secondary | ICD-10-CM | POA: Diagnosis not present

## 2019-11-18 DIAGNOSIS — Z1231 Encounter for screening mammogram for malignant neoplasm of breast: Secondary | ICD-10-CM

## 2019-11-18 LAB — ECHOCARDIOGRAM COMPLETE
Area-P 1/2: 3.48 cm2
P 1/2 time: 617 msec
S' Lateral: 2.9 cm

## 2019-11-19 ENCOUNTER — Ambulatory Visit
Admission: RE | Admit: 2019-11-19 | Discharge: 2019-11-19 | Disposition: A | Discharge status: Home / Self Care | Payer: PPO | Source: Ambulatory Visit | Attending: Cardiology | Admitting: Cardiology

## 2019-11-19 DIAGNOSIS — R9431 Abnormal electrocardiogram [ECG] [EKG]: Secondary | ICD-10-CM

## 2019-11-19 DIAGNOSIS — R0789 Other chest pain: Secondary | ICD-10-CM

## 2019-11-19 DIAGNOSIS — I2489 Other forms of acute ischemic heart disease: Secondary | ICD-10-CM

## 2019-11-19 DIAGNOSIS — R011 Cardiac murmur, unspecified: Secondary | ICD-10-CM

## 2019-11-19 DIAGNOSIS — R002 Palpitations: Secondary | ICD-10-CM | POA: Diagnosis not present

## 2019-11-19 DIAGNOSIS — I248 Other forms of acute ischemic heart disease: Secondary | ICD-10-CM | POA: Insufficient documentation

## 2019-11-19 DIAGNOSIS — R778 Other specified abnormalities of plasma proteins: Secondary | ICD-10-CM

## 2019-11-19 DIAGNOSIS — I10 Essential (primary) hypertension: Secondary | ICD-10-CM

## 2019-11-19 DIAGNOSIS — R7989 Other specified abnormal findings of blood chemistry: Secondary | ICD-10-CM

## 2019-11-19 DIAGNOSIS — Z8249 Family history of ischemic heart disease and other diseases of the circulatory system: Secondary | ICD-10-CM

## 2019-11-19 MED ORDER — METOPROLOL TARTRATE 5 MG/5ML IV SOLN
10.0000 mg | Freq: Once | INTRAVENOUS | Status: AC
  Administered 2019-11-19: 10 mg via INTRAVENOUS

## 2019-11-19 MED ORDER — IOHEXOL 350 MG/ML SOLN
75.0000 mL | Freq: Once | INTRAVENOUS | Status: AC | PRN
  Administered 2019-11-19: 75 mL via INTRAVENOUS

## 2019-11-19 MED ORDER — NITROGLYCERIN 0.4 MG SL SUBL
0.8000 mg | SUBLINGUAL_TABLET | Freq: Once | SUBLINGUAL | Status: AC
  Administered 2019-11-19: 0.8 mg via SUBLINGUAL

## 2019-11-19 NOTE — Progress Notes (Signed)
Patient tolerated procedure well. Ambulate w/o difficulty. Sitting in chair drinking water provided. Encouraged to drink extra water today and reasoning explained. Verbalized understanding. All questions answered. ABC intact. No further needs. Discharge from procedure area w/o issues.  

## 2019-11-23 ENCOUNTER — Telehealth: Payer: Self-pay | Admitting: Cardiology

## 2019-11-23 DIAGNOSIS — R002 Palpitations: Secondary | ICD-10-CM | POA: Diagnosis not present

## 2019-11-23 DIAGNOSIS — M25512 Pain in left shoulder: Secondary | ICD-10-CM | POA: Diagnosis not present

## 2019-11-23 NOTE — Telephone Encounter (Signed)
Follow Up:    Pt said she saw her CT results on My Chart. She would like for somebody to call her asap and explain the results to her please.

## 2019-11-23 NOTE — Telephone Encounter (Signed)
Pt aware will call back with recommendations  once test has been reviewed by Dr Marlou Porch .Adonis Housekeeper

## 2019-11-24 NOTE — Telephone Encounter (Signed)
Jerline Pain, MD  11/24/2019 9:00 AM EST     Spoke to her on telephone regarding circumflex coronary aneurysms. 21mm diameter.  First recommendation is to take aspirin 81 mg daily. Sometimes she will have stomach upset with aspirin so I asked her to take this with a meal and potentially Pepcid.  Secondly, I have reached out to several of my interventional cardiology colleagues as well as surgical colleagues for their opinions on next steps.  We will be back in touch with her with plan.  Candee Furbish, MD

## 2019-12-02 DIAGNOSIS — M7502 Adhesive capsulitis of left shoulder: Secondary | ICD-10-CM | POA: Diagnosis not present

## 2019-12-02 DIAGNOSIS — S46012D Strain of muscle(s) and tendon(s) of the rotator cuff of left shoulder, subsequent encounter: Secondary | ICD-10-CM | POA: Diagnosis not present

## 2019-12-03 ENCOUNTER — Encounter: Payer: Self-pay | Admitting: Cardiology

## 2019-12-03 ENCOUNTER — Other Ambulatory Visit: Payer: Self-pay

## 2019-12-03 ENCOUNTER — Ambulatory Visit: Payer: PPO | Admitting: Cardiology

## 2019-12-03 VITALS — BP 140/80 | HR 66 | Ht 67.5 in | Wt 193.0 lb

## 2019-12-03 DIAGNOSIS — I10 Essential (primary) hypertension: Secondary | ICD-10-CM

## 2019-12-03 DIAGNOSIS — I2541 Coronary artery aneurysm: Secondary | ICD-10-CM | POA: Diagnosis not present

## 2019-12-03 MED ORDER — METOPROLOL SUCCINATE ER 25 MG PO TB24: 25.0000 mg | ORAL_TABLET | Freq: Every day | ORAL | 3 refills | Status: DC

## 2019-12-03 MED ORDER — ROSUVASTATIN CALCIUM 10 MG PO TABS: 10.0000 mg | ORAL_TABLET | Freq: Every day | ORAL | 3 refills | Status: DC

## 2019-12-03 NOTE — Patient Instructions (Addendum)
Medication Instructions:  Please start Crestor 10 mg daily. Continue all other medications as listed.  *If you need a refill on your cardiac medications before your next appointment, please call your pharmacy*  Testing/Procedures: Your physician has requested that you have an abdominal aorta duplex. During this test, an ultrasound is used to evaluate the aorta. Allow 30 minutes for this exam. Do not eat after midnight the day before and avoid carbonated beverages.  This will be completed at our [facility] office.  Your cardiac CT will be scheduled at:   [facility] [address] [place], [place] [address] [phone]  In 6 months  (you will need blood work before as well)  Please arrive at the [facility] main entrance of [facility] 30 minutes prior to test start time. Proceed to the [facility] Radiology Department (first floor) to check-in and test prep.  Please follow these instructions carefully (unless otherwise directed):  On the Night Before the Test: . Be sure to Drink plenty of water. . Do not consume any caffeinated/decaffeinated beverages or chocolate 12 hours prior to your test. . Do not take any antihistamines 12 hours prior to your test. . If the patient has contrast allergy: ? Patient will need a prescription for Prednisone and very clear instructions (as follows): 1. Prednisone 50 mg - take 13 hours prior to test 2. Take another Prednisone 50 mg 7 hours prior to test 3. Take another Prednisone 50 mg 1 hour prior to test 4. Take Benadryl 50 mg 1 hour prior to test . Patient must complete all four doses of above prophylactic medications. . Patient will need a ride after test due to Benadryl.  On the Day of the Test: . Drink plenty of water. Do not drink any water within one hour of the test. . Do not eat any food 4 hours prior to the test. . You may take your regular medications prior to the test.  . Take metoprolol (Lopressor) two  hours prior to test. . HOLD Furosemide/Hydrochlorothiazide morning of the test. . FEMALES- please wear underwire-free bra if available      After the Test: . Drink plenty of water. . After receiving IV contrast, you may experience a mild flushed feeling. This is normal. . On occasion, you may experience a mild rash up to 24 hours after the test. This is not dangerous. If this occurs, you can take Benadryl 25 mg and increase your fluid intake. . If you experience trouble breathing, this can be serious. If it is severe call 911 IMMEDIATELY. If it is mild, please call our office. . If you take any of these medications: Glipizide/Metformin, Avandament, Glucavance, please do not take 48 hours after completing test unless otherwise instructed.   Once we have confirmed authorization from your insurance company, we will call you to set up a date and time for your test. Based on how quickly your insurance processes prior authorizations requests, please allow up to 4 weeks to be contacted for scheduling your Cardiac CT appointment. Be advised that routine Cardiac CT appointments could be scheduled as many as 8 weeks after your provider has ordered it.  For non-scheduling related questions, please contact the cardiac imaging nurse navigator should you have any questions/concerns: Marchia Bond, Cardiac Imaging Nurse Navigator Burley Saver, Interim Cardiac Imaging Nurse [place] [facility] Direct Office Dial: [phone]   For scheduling needs, including cancellations and rescheduling, please call [place], [phone] (temporary number).  Follow-Up: At [facility], you and your health needs are our priority.  As part of our continuing mission to provide you with exceptional heart care, we have created designated Provider Care Teams.  These Care Teams include your primary Cardiologist (physician) and Advanced Practice Providers (APPs -  Physician Assistants and Nurse  Practitioners) who all work together to provide you with the care you need, when you need it.  We recommend signing up for the patient portal called "MyChart".  Sign up information is provided on this After Visit Summary.  MyChart is used to connect with patients for Virtual Visits (Telemedicine).  Patients are able to view lab/test results, encounter notes, upcoming appointments, etc.  Non-urgent messages can be sent to your provider as well.   To learn more about what you can do with MyChart, go to [email or web address].    Your next appointment:   6 month(s)  The format for your next appointment:   In Person  Provider:   Candee Furbish, MD after your Coronary CT scan.  Thank you for choosing [facility]!!

## 2019-12-03 NOTE — Progress Notes (Signed)
Cardiology Office Note:    Date:  12/03/2019   ID:  Robin Gilmore, DOB Sep 07, 1950, MRN [id number]  PCP:  Abner Greenspan, MD  [facility] Cardiologist:  Candee Furbish, MD  [facility] Electrophysiologist:  None   Referring MD: Abner Greenspan, MD     History of Present Illness:    Robin Gilmore is a 69 y.o. female here to discuss coronary atherosclerosis, coronary fistula, coronary artery aneurysms seen on CT.  Originally saw me with palpitations diaphoresis heart racing pain under her left breast with chest pressure.  Blood pressure was elevated at the time.  Mother had bypass surgery.  During the work-up, ended up with a coronary CT scan that uncovered the fistula as well as the coronary artery aneurysms.  Please see below for details.  Mother had AAA-interesting perhaps genetic component to her aneurysm.  Past Medical History:  Diagnosis Date  . Constipation   . Hyperlipidemia   . Hypothyroid   . IBS (irritable bowel syndrome)   . Menopausal syndrome   . Mitral prolapse and regurge used to have prophylaxis  . Obesity     Past Surgical History:  Procedure Laterality Date  . BREAST EXCISIONAL BIOPSY Right   . BREAST SURGERY     breast biopsy x 2 benign  . CESAREAN SECTION    . retrovaginal fistula repair      Current Medications: Current Meds  Medication Sig  . albuterol (PROVENTIL HFA;VENTOLIN HFA) 108 (90 Base) MCG/ACT inhaler Inhale 2 puffs into the lungs every 6 (six) hours as needed for wheezing or shortness of breath.  Marland Kitchen amLODipine (NORVASC) 5 MG tablet Take 1 tablet (5 mg total) by mouth daily.  Marland Kitchen gabapentin (NEURONTIN) 300 MG capsule Take 300 mg by mouth as needed.  Marland Kitchen levothyroxine (SYNTHROID) 150 MCG tablet Take 1 tablet (150 mcg total) by mouth daily.  . [DISCONTINUED] metoprolol succinate (TOPROL-XL) 50 MG 24 hr tablet Take 1 tablet (50 mg total) by mouth daily. Take with or immediately following a meal. (Patient taking differently: Take 25  mg by mouth daily. Take with or immediately following a meal.)     Allergies:   Codeine   Social History   Socioeconomic History  . Marital status: Married    Spouse name: Not on file  . Number of children: Not on file  . Years of education: Not on file  . Highest education level: Not on file  Occupational History  . Not on file  Tobacco Use  . Smoking status: Former Research scientist (life sciences)  . Smokeless tobacco: Never Used  Vaping Use  . Vaping Use: Never used  Substance and Sexual Activity  . Alcohol use: No    Alcohol/week: 0.0 standard drinks  . Drug use: No  . Sexual activity: Never  Other Topics Concern  . Not on file  Social History Narrative  . Not on file   Social Determinants of Health   Financial Resource Strain:   . Difficulty of Paying Living Expenses: Not on file  Food Insecurity:   . Worried About Charity fundraiser in the Last Year: Not on file  . Ran Out of Food in the Last Year: Not on file  Transportation Needs:   . Lack of Transportation (Medical): Not on file  . Lack of Transportation (Non-Medical): Not on file  Physical Activity:   . Days of Exercise per Week: Not on file  . Minutes of Exercise per Session: Not on file  Stress:   .  Feeling of Stress : Not on file  Social Connections:   . Frequency of Communication with Friends and Family: Not on file  . Frequency of Social Gatherings with Friends and Family: Not on file  . Attends Religious Services: Not on file  . Active Member of Clubs or Organizations: Not on file  . Attends Archivist Meetings: Not on file  . Marital Status: Not on file     Family History: The patient's family history includes Alcohol abuse in her brother, father, and mother; Cancer in her father; Colon polyps in her brother; Diabetes in her mother; Heart disease in her mother; Hyperlipidemia in her sister; Hypertension in her sister; Obesity in her maternal grandfather. There is no history of Breast cancer.  ROS:   Please  see the history of present illness.     All other systems reviewed and are negative.  EKGs/Labs/Other Studies Reviewed:    The following studies were reviewed today:  Coronary CT scan 11/19/2019: 1. Coronary calcium score of 1100. This was 97th percentile for age and sex matched control. Calcifications noted mainly in the left circumflex aneurysms.  2. Normal coronary origin with right dominance.  3. Two very large (gigantic), calcified coronary artery aneurysms located in the left circumflex causing minimal stenosis, <25%  4. CAD-RADS 1. Minimal non-obstructive CAD (0-24%). Consider non-atherosclerotic causes of chest pain. Consider preventive therapy and risk factor modification.  Electronically Signed: By: Kate Sable M.D.  Echocardiogram 11/18/2019:  1. Left ventricular ejection fraction, by estimation, is 60 to 65%. The  left ventricle has normal function. The left ventricle has no regional  wall motion abnormalities. Left ventricular diastolic parameters are  consistent with Grade I diastolic  dysfunction (impaired relaxation). Elevated left ventricular end-diastolic  pressure. The average left ventricular global longitudinal strain is -31.2  %. The global longitudinal strain is normal.  2. Right ventricular systolic function is normal. The right ventricular  size is normal. There is normal pulmonary artery systolic pressure.  3. The mitral valve is normal in structure. Trivial mitral valve  regurgitation. No evidence of mitral stenosis.  4. The aortic valve is tricuspid. There is mild calcification of the  aortic valve. There is mild thickening of the aortic valve. Aortic valve  regurgitation is trivial. No aortic stenosis is present.  5. Aortic dilatation noted. There is mild dilatation of the ascending  aorta, measuring 39 mm.  6. The inferior vena cava is normal in size with greater than 50%  respiratory variability, suggesting right atrial pressure  of 3 mmHg.  Zio patch monitor 11/24/2019:  Sinus rhythm with average heart rate of 68 bpm.  Rare episodes of paroxysmal atrial tachycardia, ranging from 10 beats at 150 bpm, to 22 seconds in duration at 100 bpm.  2 separate instances of second-degree heart block type I, Wenckebach-1 occurring at 6 AM another occurring at 8 AM on separate days.  Rare PVC  No pauses greater than 3 seconds, no atrial fibrillation, no ventricular tachycardia.   Prior episodes of tachycardia may have been paroxysmal atrial tachycardia.  At last clinic visit I started her on Toprol-XL 50 mg once a day.  Since she does have Wenkebach, I would like to go ahead and decrease this to 25 mg once a day.  We have discussed these findings over the telephone.  She has clinic follow-up in early January.  Candee Furbish, MD     Recent Labs: 12/30/2018: TSH 0.77 10/23/2019: BUN 21; Creatinine, Ser 0.77; Hemoglobin 13.9;  Magnesium 1.8; Platelets 349; Potassium 3.4; Sodium 138  Recent Lipid Panel    Component Value Date/Time   CHOL 193 11/03/2018 0840   TRIG 234.0 (H) 11/03/2018 0840   HDL 47.00 11/03/2018 0840   CHOLHDL 4 11/03/2018 0840   VLDL 46.8 (H) 11/03/2018 0840   LDLCALC 98 08/02/2015 0807   LDLDIRECT 90.0 11/03/2018 0840     Risk Assessment/Calculations:       Physical Exam:    VS:  BP 140/80   Pulse 66   Ht 5' 7.5" (1.715 m)   Wt 193 lb (87.5 kg)   SpO2 96%   BMI 29.78 kg/m     Wt Readings from Last 3 Encounters:  12/03/19 193 lb (87.5 kg)  10/28/19 192 lb (87.1 kg)  11/10/18 197 lb 8 oz (89.6 kg)     GEN:  Well nourished, well developed in no acute distress HEENT: Normal NECK: No JVD; No carotid bruits LYMPHATICS: No lymphadenopathy CARDIAC: RRR, no murmurs, rubs, gallops RESPIRATORY:  Clear to auscultation without rales, wheezing or rhonchi  ABDOMEN: Soft, non-tender, non-distended MUSCULOSKELETAL:  No edema; No deformity  SKIN: Warm and dry NEUROLOGIC:  Alert and oriented x  3 PSYCHIATRIC:  Normal affect   ASSESSMENT:    1. Coronary artery aneurysm   2. 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  There is no significant extravasation of contrast into the right PA. -The distal coronary end of the fistula tract forms and aneurysm  Coronary artery aneurysms -Discussed at length with multiple team members of interventional cardiology as well as cardiothoracic surgery.  Multidisciplinary plan was to go ahead and start an antiplatelet therapy and to reimage in 6 months with coronary CT scan to assess for stability of the aneurysms.  We will also go ahead and check a abdominal aortic ultrasound to ensure that she does not have any evidence of aneurysm.  She could have a genetic component with her mother having had AAA.  Also given the calcified plaque that is nonflow limiting, we will start Crestor 10 mg once a day.  She was worried about potential leg cramps.  She does state that she has cramping and leg pain occasionally.  Encouraged to use.   Medication Adjustments/Labs and Tests Ordered: Current medicines are reviewed at length with the patient today.  Concerns regarding medicines are outlined above.  Orders Placed This Encounter  Procedures  . CT CORONARY MORPH W/CTA COR W/SCORE W/CA W/CM &/OR WO/CM  . CT CORONARY FRACTIONAL FLOW RESERVE DATA PREP  . CT CORONARY FRACTIONAL FLOW RESERVE FLUID ANALYSIS  . VAS [place] AAA DUPLEX   Meds ordered this encounter  Medications  . metoprolol succinate (TOPROL-XL) 25 MG 24 hr tablet    Sig: Take 1 tablet (25 mg total) by mouth daily.    Dispense:  90 tablet    Refill:  3  . rosuvastatin (CRESTOR) 10 MG tablet    Sig: Take 1 tablet (10 mg total) by mouth daily.    Dispense:  90 tablet     Refill:  3    Patient Instructions  Medication Instructions:  Please start Crestor 10 mg daily. Continue all other medications as listed.  *If you need a refill on your cardiac medications before your next appointment, please call your pharmacy*  Testing/Procedures:  Your physician has requested that you have an abdominal aorta duplex. During this test, an ultrasound is used to evaluate the aorta. Allow 30 minutes for this exam. Do not eat after midnight the day before and avoid carbonated beverages.  This will be completed at our [facility] office.  Your cardiac CT will be scheduled at:   [facility] [address] [place], [place] [address] [phone]  In 6 months  (you will need blood work before as well)  Please arrive at the [facility] main entrance of [facility] 30 minutes prior to test start time. Proceed to the [facility] Radiology Department (first floor) to check-in and test prep.  Please follow these instructions carefully (unless otherwise directed):  On the Night Before the Test: . Be sure to Drink plenty of water. . Do not consume any caffeinated/decaffeinated beverages or chocolate 12 hours prior to your test. . Do not take any antihistamines 12 hours prior to your test. . If the patient has contrast allergy: ? Patient will need a prescription for Prednisone and very clear instructions (as follows): 1. Prednisone 50 mg - take 13 hours prior to test 2. Take another Prednisone 50 mg 7 hours prior to test 3. Take another Prednisone 50 mg 1 hour prior to test 4. Take Benadryl 50 mg 1 hour prior to test . Patient must complete all four doses of above prophylactic medications. . Patient will need a ride after test due to Benadryl.  On the Day of the Test: . Drink plenty of water. Do not drink any water within one hour of the test. . Do not eat any food 4 hours prior to the test. . You may take your regular medications prior to the test.   . Take metoprolol (Lopressor) two hours prior to test. . HOLD Furosemide/Hydrochlorothiazide morning of the test. . FEMALES- please wear underwire-free bra if available      After the Test: . Drink plenty of water. . After receiving IV contrast, you may experience a mild flushed feeling. This is normal. . On occasion, you may experience a mild rash up to 24 hours after the test. This is not dangerous. If this occurs, you can take Benadryl 25 mg and increase your fluid intake. . If you experience trouble breathing, this can be serious. If it is severe call 911 IMMEDIATELY. If it is mild, please call our office. . If you take any of these medications: Glipizide/Metformin, Avandament, Glucavance, please do not take 48 hours after completing test unless otherwise instructed.   Once we have confirmed authorization from your insurance company, we will call you to set up a date and time for your test. Based on how quickly your insurance processes prior authorizations requests, please allow up to 4 weeks to be contacted for scheduling your Cardiac CT appointment. Be advised that routine Cardiac CT appointments could be scheduled as many as 8 weeks after your provider has ordered it.  For non-scheduling related questions, please contact the cardiac imaging nurse navigator should you have any questions/concerns: Marchia Bond, Cardiac Imaging Nurse Navigator Burley Saver, Interim Cardiac Imaging Nurse [place] and Vascular Services Direct Office Dial: [phone]   For scheduling needs, including cancellations and rescheduling, please call [place], [phone] (temporary number).   Follow-Up: At [facility], you and your health needs are our priority.  As part of our continuing mission to provide you with exceptional heart care, we have created designated Provider Care Teams.  These Care Teams include your primary Cardiologist (physician) and Advanced Practice Providers (APPs -   Physician Assistants and Nurse Practitioners) who all work together to provide you with the care you need, when you need it.  We recommend signing up for the patient portal called "MyChart".  Sign up information is provided on this After Visit Summary.  MyChart is used to connect with patients for Virtual Visits (Telemedicine).  Patients are able to view lab/test results, encounter notes, upcoming appointments, etc.  Non-urgent messages can be sent to your provider as well.   To learn more about what you can do with MyChart, go to [email or web address].    Your next appointment:   6 month(s)  The format for your next appointment:   In Person  Provider:   Candee Furbish, MD after your Coronary CT scan.  Thank you for choosing [facility]!!        Signed, Candee Furbish, MD  12/03/2019 4:32 PM    [facility]

## 2019-12-15 ENCOUNTER — Telehealth: Payer: Self-pay | Admitting: Family Medicine

## 2019-12-15 DIAGNOSIS — R7309 Other abnormal glucose: Secondary | ICD-10-CM

## 2019-12-15 DIAGNOSIS — E785 Hyperlipidemia, unspecified: Secondary | ICD-10-CM

## 2019-12-15 DIAGNOSIS — E039 Hypothyroidism, unspecified: Secondary | ICD-10-CM

## 2019-12-15 DIAGNOSIS — I10 Essential (primary) hypertension: Secondary | ICD-10-CM

## 2019-12-15 NOTE — Telephone Encounter (Signed)
-----   Message from Ellamae Sia sent at 12/01/2019 11:42 AM EST ----- Regarding: lab orders for Wednesday, 12.1.21 Patient is scheduled for CPX labs, please order future labs, Thanks , Karna Christmas

## 2019-12-16 ENCOUNTER — Other Ambulatory Visit (INDEPENDENT_AMBULATORY_CARE_PROVIDER_SITE_OTHER): Payer: PPO

## 2019-12-16 ENCOUNTER — Other Ambulatory Visit: Payer: Self-pay

## 2019-12-16 ENCOUNTER — Ambulatory Visit (INDEPENDENT_AMBULATORY_CARE_PROVIDER_SITE_OTHER): Payer: PPO

## 2019-12-16 DIAGNOSIS — R7309 Other abnormal glucose: Secondary | ICD-10-CM

## 2019-12-16 DIAGNOSIS — E785 Hyperlipidemia, unspecified: Secondary | ICD-10-CM | POA: Diagnosis not present

## 2019-12-16 DIAGNOSIS — I10 Essential (primary) hypertension: Secondary | ICD-10-CM

## 2019-12-16 DIAGNOSIS — E039 Hypothyroidism, unspecified: Secondary | ICD-10-CM

## 2019-12-16 DIAGNOSIS — Z0000 Encounter for general adult medical examination without abnormal findings: Secondary | ICD-10-CM

## 2019-12-16 LAB — LIPID PANEL
Cholesterol: 162 mg/dL (ref 0–200)
HDL: 53 mg/dL (ref >39.00)
LDL Cholesterol: 79 mg/dL (ref 0–99)
NonHDL: 108.56
Total CHOL/HDL Ratio: 3
Triglycerides: 148 mg/dL (ref 0.0–149.0)
VLDL: 29.6 mg/dL (ref 0.0–40.0)

## 2019-12-16 LAB — COMPREHENSIVE METABOLIC PANEL
ALT: 15 U/L (ref 0–35)
AST: 17 U/L (ref 0–37)
Albumin: 4.6 g/dL (ref 3.5–5.2)
Alkaline Phosphatase: 49 U/L (ref 39–117)
BUN: 17 mg/dL (ref 6–23)
CO2: 30 mEq/L (ref 19–32)
Calcium: 9.8 mg/dL (ref 8.4–10.5)
Chloride: 101 mEq/L (ref 96–112)
Creatinine, Ser: 0.61 mg/dL (ref 0.40–1.20)
GFR: 90.28 mL/min (ref >60.00)
Glucose, Bld: 116 mg/dL — ABNORMAL HIGH (ref 70–99)
Potassium: 4.1 mEq/L (ref 3.5–5.1)
Sodium: 139 mEq/L (ref 135–145)
Total Bilirubin: 0.7 mg/dL (ref 0.2–1.2)
Total Protein: 7.2 g/dL (ref 6.0–8.3)

## 2019-12-16 LAB — CBC WITH DIFFERENTIAL/PLATELET
Basophils Absolute: 0.1 10*3/uL (ref 0.0–0.1)
Basophils Relative: 0.7 % (ref 0.0–3.0)
Eosinophils Absolute: 0.1 10*3/uL (ref 0.0–0.7)
Eosinophils Relative: 1 % (ref 0.0–5.0)
HCT: 41.7 % (ref 36.0–46.0)
Hemoglobin: 14 g/dL (ref 12.0–15.0)
Lymphocytes Relative: 19.4 % (ref 12.0–46.0)
Lymphs Abs: 1.5 10*3/uL (ref 0.7–4.0)
MCHC: 33.7 g/dL (ref 30.0–36.0)
MCV: 84 fl (ref 78.0–100.0)
Monocytes Absolute: 0.6 10*3/uL (ref 0.1–1.0)
Monocytes Relative: 7.7 % (ref 3.0–12.0)
Neutro Abs: 5.5 10*3/uL (ref 1.4–7.7)
Neutrophils Relative %: 71.2 % (ref 43.0–77.0)
Platelets: 347 10*3/uL (ref 150.0–400.0)
RBC: 4.97 Mil/uL (ref 3.87–5.11)
RDW: 13.7 % (ref 11.5–15.5)
WBC: 7.7 10*3/uL (ref 4.0–10.5)

## 2019-12-16 LAB — TSH: TSH: 1.33 u[IU]/mL (ref 0.35–4.50)

## 2019-12-16 LAB — HEMOGLOBIN A1C: Hgb A1c MFr Bld: 5.2 % (ref 4.6–6.5)

## 2019-12-16 NOTE — Progress Notes (Signed)
PCP notes:  Health Maintenance: Flu- due   Abnormal Screenings: none   Patient concerns: none   Nurse concerns: none   Next PCP appt.: 12/23/2019 @ 8:30 am

## 2019-12-16 NOTE — Progress Notes (Signed)
Subjective:   Robin Gilmore is a 69 y.o. female who presents for Medicare Annual (Subsequent) preventive examination.  Review of Systems: N/A      I connected with the patient today by telephone and verified that I am speaking with the correct person using two identifiers. Location patient: home Location nurse: work Persons participating in the telephone visit: patient, nurse.   I discussed the limitations, risks, security and privacy concerns of performing an evaluation and management service by telephone and the availability of in person appointments. I also discussed with the patient that there may be a patient responsible charge related to this service. The patient expressed understanding and verbally consented to this telephonic visit.        Cardiac Risk Factors include: advanced age (>65men, >29 women);hypertension;Other (see comment), Risk factor comments: hyperlipidemia     Objective:    Today's Vitals   12/16/19 1120  PainSc: 5    There is no height or weight on file to calculate BMI.  Advanced Directives 12/16/2019 11/03/2018 10/29/2017 08/23/2015  Does Patient Have a Medical Advance Directive? No No No No  Would patient like information on creating a medical advance directive? No - Patient declined No - Patient declined No - Patient declined Yes - Educational materials given    Current Medications (verified) Outpatient Encounter Medications as of 12/16/2019  Medication Sig  . albuterol (PROVENTIL HFA;VENTOLIN HFA) 108 (90 Base) MCG/ACT inhaler Inhale 2 puffs into the lungs every 6 (six) hours as needed for wheezing or shortness of breath.  Marland Kitchen amLODipine (NORVASC) 5 MG tablet Take 1 tablet (5 mg total) by mouth daily.  Marland Kitchen gabapentin (NEURONTIN) 300 MG capsule Take 300 mg by mouth as needed.  Marland Kitchen levothyroxine (SYNTHROID) 150 MCG tablet Take 1 tablet (150 mcg total) by mouth daily.  . metoprolol succinate (TOPROL-XL) 25 MG 24 hr tablet Take 1 tablet (25 mg total) by  mouth daily.  . rosuvastatin (CRESTOR) 10 MG tablet Take 1 tablet (10 mg total) by mouth daily.   No facility-administered encounter medications on file as of 12/16/2019.    Allergies (verified) Codeine   History: Past Medical History:  Diagnosis Date  . Constipation   . Hyperlipidemia   . Hypothyroid   . IBS (irritable bowel syndrome)   . Menopausal syndrome   . Mitral prolapse and regurge used to have prophylaxis  . Obesity    Past Surgical History:  Procedure Laterality Date  . BREAST EXCISIONAL BIOPSY Right   . BREAST SURGERY     breast biopsy x 2 benign  . CESAREAN SECTION    . retrovaginal fistula repair     Family History  Problem Relation Age of Onset  . Alcohol abuse Mother   . Diabetes Mother   . Heart disease Mother        ? MI CAD  . Cancer Father        lung CA  . Alcohol abuse Father   . Hypertension Sister   . Hyperlipidemia Sister   . Alcohol abuse Brother   . Colon polyps Brother   . Obesity Maternal Grandfather   . Breast cancer Neg Hx    Social History   Socioeconomic History  . Marital status: Married    Spouse name: Not on file  . Number of children: Not on file  . Years of education: Not on file  . Highest education level: Not on file  Occupational History  . Not on file  Tobacco Use  .  Smoking status: Former Research scientist (life sciences)  . Smokeless tobacco: Never Used  Vaping Use  . Vaping Use: Never used  Substance and Sexual Activity  . Alcohol use: No    Alcohol/week: 0.0 standard drinks  . Drug use: No  . Sexual activity: Never  Other Topics Concern  . Not on file  Social History Narrative  . Not on file   Social Determinants of Health   Financial Resource Strain: Low Risk   . Difficulty of Paying Living Expenses: Not hard at all  Food Insecurity: No Food Insecurity  . Worried About Charity fundraiser in the Last Year: Never true  . Ran Out of Food in the Last Year: Never true  Transportation Needs: No Transportation Needs  . Lack of  Transportation (Medical): No  . Lack of Transportation (Non-Medical): No  Physical Activity: Inactive  . Days of Exercise per Week: 0 days  . Minutes of Exercise per Session: 0 min  Stress: No Stress Concern Present  . Feeling of Stress : Not at all  Social Connections:   . Frequency of Communication with Friends and Family: Not on file  . Frequency of Social Gatherings with Friends and Family: Not on file  . Attends Religious Services: Not on file  . Active Member of Clubs or Organizations: Not on file  . Attends Archivist Meetings: Not on file  . Marital Status: Not on file    Tobacco Counseling Counseling given: Not Answered   Clinical Intake:  Pre-visit preparation completed: Yes  Pain : 0-10 Pain Score: 5  Pain Type: Chronic pain Pain Location: Back Pain Descriptors / Indicators: Aching Pain Onset: More than a month ago Pain Frequency: Intermittent     Nutritional Risks: Nausea/ vomitting/ diarrhea (slight nausea sometimes) Diabetes: No  How often do you need to have someone help you when you read instructions, pamphlets, or other written materials from your doctor or pharmacy?: 1 - Never What is the last grade level you completed in school?: some college  Diabetic: No Nutrition Risk Assessment:  Has the patient had any N/V/D within the last 2 months?  Yes , nausea sometimes  Does the patient have any non-healing wounds?  No  Has the patient had any unintentional weight loss or weight gain?  No   Diabetes:  Is the patient diabetic?  No  If diabetic, was a CBG obtained today?  N/A, telephonic visit  Did the patient bring in their glucometer from home?  N/A How often do you monitor your CBG's? N/A.   Financial Strains and Diabetes Management:  Are you having any financial strains with the device, your supplies or your medication? N/A.  Does the patient want to be seen by Chronic Care Management for management of their diabetes?  N/A Would the  patient like to be referred to a Nutritionist or for Diabetic Management?  N/A    Interpreter Needed?: No  Information entered by :: CJohnson, LPN   Activities of Daily Living In your present state of health, do you have any difficulty performing the following activities: 12/16/2019  Hearing? N  Vision? N  Difficulty concentrating or making decisions? N  Walking or climbing stairs? N  Dressing or bathing? N  Doing errands, shopping? N  Preparing Food and eating ? N  Using the Toilet? N  In the past six months, have you accidently leaked urine? N  Do you have problems with loss of bowel control? N  Managing your Medications? N  Managing  your Finances? N  Housekeeping or managing your Housekeeping? N  Some recent data might be hidden    Patient Care Team: Tower, Wynelle Fanny, MD as PCP - General Marlou Porch Thana Farr, MD as PCP - Cardiology (Cardiology)  Indicate any recent Medical Services you may have received from other than [facility] providers in the past year (date may be approximate).     Assessment:   This is a routine wellness examination for Donasia.  Hearing/Vision screen  Hearing Screening   125Hz  250Hz  500Hz  1000Hz  2000Hz  3000Hz  4000Hz  6000Hz  8000Hz   Right ear:           Left ear:           Vision Screening Comments: Patient gets annual eye exams.  Dietary issues and exercise activities discussed: Current Exercise Habits: The patient does not participate in regular exercise at present, Exercise limited by: None identified  Goals    . Increase physical activity     Starting 10/29/2017, I will continue to walk at 30 minutes 2-3 days a week and to do strength training with hand weights intermittently.     . Patient Stated     11/03/2018, I will start exercising more on a daily basis.     . Patient Stated     12/16/2019, I will maintain and continue medications as prescribed.       Depression Screen PHQ 2/9 Scores 12/16/2019 11/03/2018 10/29/2017 08/15/2016 08/23/2015  06/30/2012  PHQ - 2 Score 0 0 0 0 0 0  PHQ- 9 Score 0 0 0 3 - -    Fall Risk Fall Risk  12/16/2019 11/03/2018 10/29/2017 08/23/2015  Falls in the past year? 0 0 No Yes  Comment - - - fell over pet on floor  Number falls in past yr: 0 0 - 1  Injury with Fall? 0 0 - No  Risk for fall due to : Medication side effect Medication side effect - -  Follow up Falls evaluation completed;Falls prevention discussed Falls evaluation completed;Falls prevention discussed - Falls evaluation completed    Any stairs in or around the home? Yes  If so, are there any without handrails? No  Home free of loose throw rugs in walkways, pet beds, electrical cords, etc? Yes  Adequate lighting in your home to reduce risk of falls? Yes   ASSISTIVE DEVICES UTILIZED TO PREVENT FALLS:  Life alert? No  Use of a cane, walker or w/c? No  Grab bars in the bathroom? No  Shower chair or bench in shower? No  Elevated toilet seat or a handicapped toilet? No   TIMED UP AND GO:  Was the test performed? N/A, telephonic visit .   Cognitive Function: MMSE - Mini Mental State Exam 12/16/2019 11/03/2018 10/29/2017 08/23/2015  Orientation to time 5 5 5 5   Orientation to Place 5 5 5 5   Registration 3 3 3 3   Attention/ Calculation 5 5 0 0  Recall 3 3 3 3   Language- name 2 objects - - 0 0  Language- repeat 1 1 1 1   Language- follow 3 step command - - 3 3  Language- read & follow direction - - 0 0  Write a sentence - - 0 0  Copy design - - 0 0  Total score - - 20 20  Mini Cog  Mini-Cog screen was completed. Maximum score is 22. A value of 0 denotes this part of the MMSE was not completed or the patient failed this part of the Mini-Cog screening.  Immunizations Immunization History  Administered Date(s) Administered  . Fluad Quad(high Dose 65+) 11/10/2018  . Influenza Split 01/29/2011  . Influenza Whole 11/21/2009  . Influenza, High Dose Seasonal PF 10/24/2016, 10/29/2017  . Influenza,inj,Quad PF,6+ Mos  11/18/2013  . Influenza-Unspecified 11/12/2012, 10/24/2016  . PFIZER SARS-COV-2 Vaccination 03/15/2019, 04/08/2019  . Pneumococcal Conjugate-13 08/09/2015  . Pneumococcal Polysaccharide-23 08/15/2016  . Td 11/15/2001  . Tdap 01/29/2011  . Zoster 11/22/2012    TDAP status: Up to date Flu Vaccine status: due, will get at upcoming physical  Pneumococcal vaccine status: Up to date Covid-19 vaccine status: Completed vaccines  Qualifies for Shingles Vaccine? Yes   Zostavax completed Yes   Shingrix Completed?: No.    Education has been provided regarding the importance of this vaccine. Patient has been advised to call insurance company to determine out of pocket expense if they have not yet received this vaccine. Advised may also receive vaccine at local pharmacy or Health Dept. Verbalized acceptance and understanding.  Screening Tests Health Maintenance  Topic Date Due  . INFLUENZA VACCINE  08/16/2019  . MAMMOGRAM  11/17/2020  . TETANUS/TDAP  01/28/2021  . COLONOSCOPY  04/01/2021  . DEXA SCAN  Completed  . COVID-19 Vaccine  Completed  . Hepatitis C Screening  Completed  . PNA vac Low Risk Adult  Completed    Health Maintenance  Health Maintenance Due  Topic Date Due  . INFLUENZA VACCINE  08/16/2019    Colorectal cancer screening: Completed 04/02/2011. Repeat every 10 years Mammogram status: Completed 11/18/2019. Repeat every year Bone Density status: Completed 11/14/2015. Results reflect: Bone density results: NORMAL. Repeat every 2-5 years.  Lung Cancer Screening: (Low Dose CT Chest recommended if Age 10-80 years, 30 pack-year currently smoking OR have quit w/in 15years.) does not qualify.   Additional Screening:  Hepatitis C Screening: does qualify; Completed 10/29/2017   Vision Screening: Recommended annual ophthalmology exams for early detection of glaucoma and other disorders of the eye. Is the patient up to date with their annual eye exam?  Yes  Who is the provider  or what is the name of the office in which the patient attends annual eye exams? [facility]  If pt is not established with a provider, would they like to be referred to a provider to establish care? No .   Dental Screening: Recommended annual dental exams for proper oral hygiene  Community Resource Referral / Chronic Care Management: CRR required this visit?  No   CCM required this visit?  No      Plan:     I have personally reviewed and noted the following in the patient's chart:   . Medical and social history . Use of alcohol, tobacco or illicit drugs  . Current medications and supplements . Functional ability and status . Nutritional status . Physical activity . Advanced directives . List of other physicians . Hospitalizations, surgeries, and ER visits in previous 12 months . Vitals . Screenings to include cognitive, depression, and falls . Referrals and appointments  In addition, I have reviewed and discussed with patient certain preventive protocols, quality metrics, and best practice recommendations. A written personalized care plan for preventive services as well as general preventive health recommendations were provided to patient.    Due to this being a telephonic visit, the after visit summary with patients personalized plan was offered to patient via office or my-chart. Patient preferred to pick up at office at next visit or via mychart.    Andrez Grime, LPN  12/16/2019       

## 2019-12-16 NOTE — Patient Instructions (Signed)
Robin Gilmore , Thank you for taking time to come for your Medicare Wellness Visit. I appreciate your ongoing commitment to your health goals. Please review the following plan we discussed and let me know if I can assist you in the future.   Screening recommendations/referrals: Colonoscopy: Up to date, completed 04/02/2011, due 03/2021 Mammogram: Up to date, completed 11/18/2019, due 11/2020 Bone Density: Up to date, completed 11/14/2015, due 2-5 years  Recommended yearly ophthalmology/optometry visit for glaucoma screening and checkup Recommended yearly dental visit for hygiene and checkup  Vaccinations: Influenza vaccine: due, will get at upcoming physical  Pneumococcal vaccine: Completed series Tdap vaccine: Up to date, completed 01/29/2011, due 01/2021 Shingles vaccine: due, check with your insurance regarding coverage if interested    Covid-19:Completed series  Advanced directives: Advance directive discussed with you today. Even though you declined this today please call our office should you change your mind and we can give you the proper paperwork for you to fill out.  Conditions/risks identified: hypertension, hyperlipidemia  Next appointment: Follow up in one year for your annual wellness visit    Preventive Care 70 Years and Older, Female Preventive care refers to lifestyle choices and visits with your health care provider that can promote health and wellness. What does preventive care include?  A yearly physical exam. This is also called an annual well check.  Dental exams once or twice a year.  Routine eye exams. Ask your health care provider how often you should have your eyes checked.  Personal lifestyle choices, including:  Daily care of your teeth and gums.  Regular physical activity.  Eating a healthy diet.  Avoiding tobacco and drug use.  Limiting alcohol use.  Practicing safe sex.  Taking low-dose aspirin every day.  Taking vitamin and mineral  supplements as recommended by your health care provider. What happens during an annual well check? The services and screenings done by your health care provider during your annual well check will depend on your age, overall health, lifestyle risk factors, and family history of disease. Counseling  Your health care provider may ask you questions about your:  Alcohol use.  Tobacco use.  Drug use.  Emotional well-being.  Home and relationship well-being.  Sexual activity.  Eating habits.  History of falls.  Memory and ability to understand (cognition).  Work and work Statistician.  Reproductive health. Screening  You may have the following tests or measurements:  Height, weight, and BMI.  Blood pressure.  Lipid and cholesterol levels. These may be checked every 5 years, or more frequently if you are over 22 years old.  Skin check.  Lung cancer screening. You may have this screening every year starting at age 70 if you have a 30-pack-year history of smoking and currently smoke or have quit within the past 15 years.  Fecal occult blood test (FOBT) of the stool. You may have this test every year starting at age 70.  Flexible sigmoidoscopy or colonoscopy. You may have a sigmoidoscopy every 5 years or a colonoscopy every 10 years starting at age 70.  Hepatitis C blood test.  Hepatitis B blood test.  Sexually transmitted disease (STD) testing.  Diabetes screening. This is done by checking your blood sugar (glucose) after you have not eaten for a while (fasting). You may have this done every 1-3 years.  Bone density scan. This is done to screen for osteoporosis. You may have this done starting at age 70.  Mammogram. This may be done every 1-2 years. Talk  to your health care provider about how often you should have regular mammograms. Talk with your health care provider about your test results, treatment options, and if necessary, the need for more tests. Vaccines  Your  health care provider may recommend certain vaccines, such as:  Influenza vaccine. This is recommended every year.  Tetanus, diphtheria, and acellular pertussis (Tdap, Td) vaccine. You may need a Td booster every 10 years.  Zoster vaccine. You may need this after age 70.  Pneumococcal 13-valent conjugate (PCV13) vaccine. One dose is recommended after age 70.  Pneumococcal polysaccharide (PPSV23) vaccine. One dose is recommended after age 70. Talk to your health care provider about which screenings and vaccines you need and how often you need them. This information is not intended to replace advice given to you by your health care provider. Make sure you discuss any questions you have with your health care provider. Document Released: 01/28/2015 Document Revised: 09/21/2015 Document Reviewed: 11/02/2014 Elsevier Interactive Patient Education  2017 [employer] Prevention in the Home Falls can cause injuries. They can happen to people of all ages. There are many things you can do to make your home safe and to help prevent falls. What can I do on the outside of my home?  Regularly fix the edges of walkways and driveways and fix any cracks.  Remove anything that might make you trip as you walk through a door, such as a raised step or threshold.  Trim any bushes or trees on the path to your home.  Use bright outdoor lighting.  Clear any walking paths of anything that might make someone trip, such as rocks or tools.  Regularly check to see if handrails are loose or broken. Make sure that both sides of any steps have handrails.  Any raised decks and porches should have guardrails on the edges.  Have any leaves, snow, or ice cleared regularly.  Use sand or salt on walking paths during winter.  Clean up any spills in your garage right away. This includes oil or grease spills. What can I do in the bathroom?  Use night lights.  Install grab bars by the toilet and in the tub and  shower. Do not use towel bars as grab bars.  Use non-skid mats or decals in the tub or shower.  If you need to sit down in the shower, use a plastic, non-slip stool.  Keep the floor dry. Clean up any water that spills on the floor as soon as it happens.  Remove soap buildup in the tub or shower regularly.  Attach bath mats securely with double-sided non-slip rug tape.  Do not have throw rugs and other things on the floor that can make you trip. What can I do in the bedroom?  Use night lights.  Make sure that you have a light by your bed that is easy to reach.  Do not use any sheets or blankets that are too big for your bed. They should not hang down onto the floor.  Have a firm chair that has side arms. You can use this for support while you get dressed.  Do not have throw rugs and other things on the floor that can make you trip. What can I do in the kitchen?  Clean up any spills right away.  Avoid walking on wet floors.  Keep items that you use a lot in easy-to-reach places.  If you need to reach something above you, use a strong step stool that  has a grab bar.  Keep electrical cords out of the way.  Do not use floor polish or wax that makes floors slippery. If you must use wax, use non-skid floor wax.  Do not have throw rugs and other things on the floor that can make you trip. What can I do with my stairs?  Do not leave any items on the stairs.  Make sure that there are handrails on both sides of the stairs and use them. Fix handrails that are broken or loose. Make sure that handrails are as long as the stairways.  Check any carpeting to make sure that it is firmly attached to the stairs. Fix any carpet that is loose or worn.  Avoid having throw rugs at the top or bottom of the stairs. If you do have throw rugs, attach them to the floor with carpet tape.  Make sure that you have a light switch at the top of the stairs and the bottom of the stairs. If you do not  have them, ask someone to add them for you. What else can I do to help prevent falls?  Wear shoes that:  Do not have high heels.  Have rubber bottoms.  Are comfortable and fit you well.  Are closed at the toe. Do not wear sandals.  If you use a stepladder:  Make sure that it is fully opened. Do not climb a closed stepladder.  Make sure that both sides of the stepladder are locked into place.  Ask someone to hold it for you, if possible.  Clearly mark and make sure that you can see:  Any grab bars or handrails.  First and last steps.  Where the edge of each step is.  Use tools that help you move around (mobility aids) if they are needed. These include:  Canes.  Walkers.  Scooters.  Crutches.  Turn on the lights when you go into a dark area. Replace any light bulbs as soon as they burn out.  Set up your furniture so you have a clear path. Avoid moving your furniture around.  If any of your floors are uneven, fix them.  If there are any pets around you, be aware of where they are.  Review your medicines with your doctor. Some medicines can make you feel dizzy. This can increase your chance of falling. Ask your doctor what other things that you can do to help prevent falls. This information is not intended to replace advice given to you by your health care provider. Make sure you discuss any questions you have with your health care provider. Document Released: 10/28/2008 Document Revised: 06/09/2015 Document Reviewed: 02/05/2014 Elsevier Interactive Patient Education  2017 [employer].

## 2019-12-22 ENCOUNTER — Ambulatory Visit (HOSPITAL_COMMUNITY)
Admission: RE | Admit: 2019-12-22 | Discharge: 2019-12-22 | Disposition: A | Discharge status: Home / Self Care | Payer: PPO | Source: Ambulatory Visit | Attending: Cardiology | Admitting: Cardiology

## 2019-12-22 ENCOUNTER — Other Ambulatory Visit: Payer: Self-pay

## 2019-12-22 DIAGNOSIS — I10 Essential (primary) hypertension: Secondary | ICD-10-CM | POA: Insufficient documentation

## 2019-12-22 DIAGNOSIS — I2541 Coronary artery aneurysm: Secondary | ICD-10-CM | POA: Insufficient documentation

## 2019-12-23 ENCOUNTER — Telehealth: Payer: Self-pay | Admitting: Family Medicine

## 2019-12-23 ENCOUNTER — Encounter: Payer: PPO | Admitting: Family Medicine

## 2019-12-23 MED ORDER — LEVOTHYROXINE SODIUM 150 MCG PO TABS
150.0000 ug | ORAL_TABLET | Freq: Every day | ORAL | 0 refills | Status: DC
Start: 2019-12-23 — End: 2019-12-29

## 2019-12-23 NOTE — Telephone Encounter (Signed)
Since TSH lasb was in normal range, will refill med now

## 2019-12-23 NOTE — Telephone Encounter (Signed)
Patient had a cpx scheduled for today.  We had to reschedule appointment to 12/29/19.  Patient is requesting her thyroid refill be sent to [facility] mail order since Dr.Tower has her lab results.  She wants to make sure she doesn't run out of mediction.

## 2019-12-29 ENCOUNTER — Encounter: Payer: Self-pay | Admitting: Family Medicine

## 2019-12-29 ENCOUNTER — Ambulatory Visit (INDEPENDENT_AMBULATORY_CARE_PROVIDER_SITE_OTHER): Payer: PPO | Admitting: Family Medicine

## 2019-12-29 ENCOUNTER — Other Ambulatory Visit: Payer: Self-pay

## 2019-12-29 VITALS — BP 132/70 | HR 70 | Temp 97.3°F | Ht 66.5 in | Wt 191.4 lb

## 2019-12-29 DIAGNOSIS — Z0001 Encounter for general adult medical examination with abnormal findings: Secondary | ICD-10-CM | POA: Diagnosis not present

## 2019-12-29 DIAGNOSIS — E785 Hyperlipidemia, unspecified: Secondary | ICD-10-CM | POA: Diagnosis not present

## 2019-12-29 DIAGNOSIS — M79605 Pain in left leg: Secondary | ICD-10-CM

## 2019-12-29 DIAGNOSIS — I10 Essential (primary) hypertension: Secondary | ICD-10-CM | POA: Diagnosis not present

## 2019-12-29 DIAGNOSIS — E66811 Obesity, class 1: Secondary | ICD-10-CM

## 2019-12-29 DIAGNOSIS — E6609 Other obesity due to excess calories: Secondary | ICD-10-CM

## 2019-12-29 DIAGNOSIS — R7309 Other abnormal glucose: Secondary | ICD-10-CM

## 2019-12-29 DIAGNOSIS — E039 Hypothyroidism, unspecified: Secondary | ICD-10-CM

## 2019-12-29 DIAGNOSIS — Z23 Encounter for immunization: Secondary | ICD-10-CM | POA: Diagnosis not present

## 2019-12-29 DIAGNOSIS — I2541 Coronary artery aneurysm: Secondary | ICD-10-CM | POA: Diagnosis not present

## 2019-12-29 DIAGNOSIS — Z6830 Body mass index (BMI) 30.0-30.9, adult: Secondary | ICD-10-CM | POA: Diagnosis not present

## 2019-12-29 MED ORDER — LEVOTHYROXINE SODIUM 150 MCG PO TABS
150.0000 ug | ORAL_TABLET | Freq: Every day | ORAL | 3 refills | Status: DC
Start: 2019-12-29 — End: 2020-12-19

## 2019-12-29 MED ORDER — AMLODIPINE BESYLATE 5 MG PO TABS
5.0000 mg | ORAL_TABLET | Freq: Every day | ORAL | 3 refills | Status: DC
Start: 2019-12-29 — End: 2021-02-06

## 2019-12-29 MED ORDER — PREDNISONE 10 MG PO TABS: ORAL_TABLET | ORAL | 0 refills | Status: DC

## 2019-12-29 NOTE — Patient Instructions (Addendum)
Keep thinking about getting a covid booster  Wait 2-3 weeks after finishing the prednisone   If you are interested in the new shingles vaccine (Shingrix) - call your local pharmacy to check on coverage and availability  If affordable, get on a wait list at your pharmacy to get the vaccine.  Try to get 1200-1500 mg of calcium per day with at least 1000 iu of vitamin D - for bone health   Take prednisone for the leg pain and follow up with your neuro surgeon

## 2019-12-29 NOTE — Assessment & Plan Note (Signed)
Hypothyroidism  Pt has no clinical changes No change in energy level/ hair or skin/ edema and no tremor Lab Results  Component Value Date   TSH 1.33 12/16/2019    Plan to continue levothyroxine 150 mg daily

## 2019-12-29 NOTE — Assessment & Plan Note (Signed)
2 large in L circumflex  Followed by cardiology  On statin  bp well controlled

## 2019-12-29 NOTE — Assessment & Plan Note (Signed)
Discussed how this problem influences overall health and the risks it imposes  Reviewed plan for weight loss with lower calorie diet (via better food choices and also portion control or program like weight watchers) and exercise building up to or more than 30 minutes 5 days per week including some aerobic activity    

## 2019-12-29 NOTE — Assessment & Plan Note (Signed)
Lab Results  Component Value Date   HGBA1C 5.2 12/16/2019   disc imp of low glycemic diet and wt loss to prevent DM2

## 2019-12-29 NOTE — Progress Notes (Signed)
Subjective:    Patient ID: Robin Gilmore, female    DOB: [birth year], 69 y.o.   MRN: [id number]  This visit occurred during the SARS-CoV-2 public health emergency.  Safety protocols were in place, including screening questions prior to the visit, additional usage of staff PPE, and extensive cleaning of exam room while observing appropriate contact time as indicated for disinfecting solutions.    HPI  Here for health maintenance exam and to review chronic medical problems    Wt Readings from Last 3 Encounters:  12/29/19 191 lb 7 oz (86.8 kg)  12/03/19 193 lb (87.5 kg)  10/28/19 192 lb (87.1 kg)   30.44 kg/m  Has had a rough year - started with leg pain  Prednisone helped  Pinched nerve on MRI  Wants another round of prednisone-bothering her after decorating  Has seen Dr Arnoldo Morale   Then arm pain  Torn rotator cuff/partial -- went to PT   Watching aneurysm in coronary arteries (L circumflex large causing minimal stenosis) CT done showed coronary artery aneuryxms as well as a fistula (no AAA) Has had episodes of tachycardia- was started on beta blocker   Had amw on 12/16/19 No gaps Had flu shot today   covid immunized and planning a booster Unsure when she will get   zostavax 11/14  Mammogram 11/21 Self breast exam - no lumps   Colonoscopy 3/13   dexa 10/17 - normal range Falls- none Fractures-none Supplements - ca/D- needs to be better  Exercise - (when leg is not hurting)  Doing PT for arm and walking   bp is stable today  No cp or palpitations or headaches or edema  No side effects to medicines  BP Readings from Last 3 Encounters:  12/29/19 132/70  12/03/19 140/80  11/19/19 132/63   takes amlodipine 5 mg daily  Metoprolol xl 25 mg daily  Pulse Readings from Last 3 Encounters:  12/29/19 70  12/03/19 66  11/19/19 61   Hyperlipidemia  Lab Results  Component Value Date   CHOL 162 12/16/2019   CHOL 193 11/03/2018   CHOL 197 10/29/2017   Lab  Results  Component Value Date   HDL 53.00 12/16/2019   HDL 47.00 11/03/2018   HDL 46.70 10/29/2017   Lab Results  Component Value Date   LDLCALC 79 12/16/2019   LDLCALC 98 08/02/2015   LDLCALC 113 (H) 12/04/2010   Lab Results  Component Value Date   TRIG 148.0 12/16/2019   TRIG 234.0 (H) 11/03/2018   TRIG 261.0 (H) 10/29/2017   Lab Results  Component Value Date   CHOLHDL 3 12/16/2019   CHOLHDL 4 11/03/2018   CHOLHDL 4 10/29/2017   Lab Results  Component Value Date   LDLDIRECT 90.0 11/03/2018   LDLDIRECT 111.0 10/29/2017   LDLDIRECT 82.0 08/15/2016   Takes crestor 10 mg daily   Past elevated glucose Lab Results  Component Value Date   HGBA1C 5.2 12/16/2019    Lab Results  Component Value Date   CREATININE 0.61 12/16/2019   BUN 17 12/16/2019   NA 139 12/16/2019   K 4.1 12/16/2019   CL 101 12/16/2019   CO2 30 12/16/2019   Lab Results  Component Value Date   ALT 15 12/16/2019   AST 17 12/16/2019   ALKPHOS 49 12/16/2019   BILITOT 0.7 12/16/2019   Lab Results  Component Value Date   WBC 7.7 12/16/2019   HGB 14.0 12/16/2019   HCT 41.7 12/16/2019   MCV 84.0 12/16/2019  PLT 347.0 12/16/2019    Patient Active Problem List   Diagnosis Date Noted  . Left leg pain 12/29/2019  . Coronary aneurysm 12/29/2019  . Essential hypertension 11/10/2018  . Elevated glucose level 11/10/2018  . Mitral valve prolapse 11/04/2017  . Estrogen deficiency 08/09/2015  . Obesity 11/18/2013  . Encounter for routine gynecological examination 06/30/2012  . Encounter for general adult medical examination with abnormal findings 01/29/2011  . Family history of colonic polyps 01/29/2011  . Hypothyroidism 05/16/2009  . Hyperlipidemia LDL goal <130 05/16/2009  . CYSTOCELE WITHOUT MENTION UTERINE PROLAPSE LAT 05/16/2009   Past Medical History:  Diagnosis Date  . Constipation   . Hyperlipidemia   . Hypothyroid   . IBS (irritable bowel syndrome)   . Menopausal syndrome   .  Mitral prolapse and regurge used to have prophylaxis  . Obesity    Past Surgical History:  Procedure Laterality Date  . BREAST EXCISIONAL BIOPSY Right   . BREAST SURGERY     breast biopsy x 2 benign  . CESAREAN SECTION    . retrovaginal fistula repair     Social History   Tobacco Use  . Smoking status: Former Research scientist (life sciences)  . Smokeless tobacco: Never Used  Vaping Use  . Vaping Use: Never used  Substance Use Topics  . Alcohol use: No    Alcohol/week: 0.0 standard drinks  . Drug use: No   Family History  Problem Relation Age of Onset  . Alcohol abuse Mother   . Diabetes Mother   . Heart disease Mother        ? MI CAD  . Cancer Father        lung CA  . Alcohol abuse Father   . Hypertension Sister   . Hyperlipidemia Sister   . Alcohol abuse Brother   . Colon polyps Brother   . Obesity Maternal Grandfather   . Breast cancer Neg Hx    Allergies  Allergen Reactions  . Codeine     REACTION: Severe nausea and vomitnig   Current Outpatient Medications on File Prior to Visit  Medication Sig Dispense Refill  . albuterol (PROVENTIL HFA;VENTOLIN HFA) 108 (90 Base) MCG/ACT inhaler Inhale 2 puffs into the lungs every 6 (six) hours as needed for wheezing or shortness of breath. 3 Inhaler 3  . gabapentin (NEURONTIN) 300 MG capsule Take 300 mg by mouth as needed.    . metoprolol succinate (TOPROL-XL) 25 MG 24 hr tablet Take 1 tablet (25 mg total) by mouth daily. 90 tablet 3  . rosuvastatin (CRESTOR) 10 MG tablet Take 1 tablet (10 mg total) by mouth daily. 90 tablet 3   No current facility-administered medications on file prior to visit.     Review of Systems  Constitutional: Negative for activity change, appetite change, fatigue, fever and unexpected weight change.  HENT: Negative for congestion, ear pain, rhinorrhea, sinus pressure and sore throat.   Eyes: Negative for pain, redness and visual disturbance.  Respiratory: Negative for cough, shortness of breath and wheezing.    Cardiovascular: Negative for chest pain and palpitations.  Gastrointestinal: Negative for abdominal pain, blood in stool, constipation and diarrhea.  Endocrine: Negative for polydipsia and polyuria.  Genitourinary: Negative for dysuria, frequency and urgency.  Musculoskeletal: Negative for arthralgias, back pain and myalgias.       L lateral thigh pain   Skin: Negative for pallor and rash.  Allergic/Immunologic: Negative for environmental allergies.  Neurological: Negative for dizziness, syncope and headaches.  Tingling in L leg  Hematological: Negative for adenopathy. Does not bruise/bleed easily.  Psychiatric/Behavioral: Negative for decreased concentration and dysphoric mood. The patient is not nervous/anxious.        Objective:   Physical Exam Constitutional:      General: She is not in acute distress.    Appearance: Normal appearance. She is well-developed. She is obese. She is not ill-appearing or diaphoretic.  HENT:     Head: Normocephalic and atraumatic.     Right Ear: Tympanic membrane, ear canal and external ear normal.     Left Ear: Tympanic membrane, ear canal and external ear normal.     Nose: Nose normal. No congestion.     Mouth/Throat:     Mouth: Mucous membranes are moist.     Pharynx: Oropharynx is clear. No posterior oropharyngeal erythema.  Eyes:     General: No scleral icterus.    Extraocular Movements: Extraocular movements intact.     Conjunctiva/sclera: Conjunctivae normal.     Pupils: Pupils are equal, round, and reactive to light.  Neck:     Thyroid: No thyromegaly.     Vascular: No carotid bruit or JVD.  Cardiovascular:     Rate and Rhythm: Normal rate and regular rhythm.     Pulses: Normal pulses.     Heart sounds: Normal heart sounds. No gallop.   Pulmonary:     Effort: Pulmonary effort is normal. No respiratory distress.     Breath sounds: Normal breath sounds. No wheezing.     Comments: Good air exch Chest:     Chest wall: No  tenderness.  Abdominal:     General: Bowel sounds are normal. There is no distension or abdominal bruit.     Palpations: Abdomen is soft. There is no mass.     Tenderness: There is no abdominal tenderness.     Hernia: No hernia is present.  Genitourinary:    Comments: Breast exam: No mass, nodules, thickening, tenderness, bulging, retraction, inflamation, nipple discharge or skin changes noted.  No axillary or clavicular LA.     Musculoskeletal:        General: No tenderness. Normal range of motion.     Cervical back: Normal range of motion and neck supple. No rigidity. No muscular tenderness.     Right lower leg: No edema.     Left lower leg: No edema.     Comments: Mild tenderness left lateral leg   Lymphadenopathy:     Cervical: No cervical adenopathy.  Skin:    General: Skin is warm and dry.     Coloration: Skin is not pale.     Findings: No erythema or rash.     Comments: Solar lentigines diffusely sks diffusely   Neurological:     Mental Status: She is alert. Mental status is at baseline.     Cranial Nerves: No cranial nerve deficit.     Motor: No abnormal muscle tone.     Coordination: Coordination normal.     Gait: Gait normal.     Deep Tendon Reflexes: Reflexes are normal and symmetric. Reflexes normal.  Psychiatric:        Mood and Affect: Mood normal.        Cognition and Memory: Cognition and memory normal.     Comments: Pleasant            Assessment & Plan:   Problem List Items Addressed This Visit      Cardiovascular and Mediastinum   Essential hypertension  bp in fair control at this time  BP Readings from Last 1 Encounters:  12/29/19 132/70   No changes needed Most recent labs reviewed  Disc lifstyle change with low sodium diet and exercise  Plan to continue amlodipine 5 mg daily and metoprolol xl 25 mg daily  Sees cardiology also      Relevant Medications   amLODipine (NORVASC) 5 MG tablet   Coronary aneurysm    2 large in L circumflex   Followed by cardiology  On statin  bp well controlled      Relevant Medications   amLODipine (NORVASC) 5 MG tablet     Endocrine   Hypothyroidism    Hypothyroidism  Pt has no clinical changes No change in energy level/ hair or skin/ edema and no tremor Lab Results  Component Value Date   TSH 1.33 12/16/2019    Plan to continue levothyroxine 150 mg daily      Relevant Medications   levothyroxine (SYNTHROID) 150 MCG tablet     Other   Hyperlipidemia LDL goal <130    Disc goals for lipids and reasons to control them Rev last labs with pt Rev low sat fat diet in detail Well controlled with crestor 10 mg daily      Relevant Medications   amLODipine (NORVASC) 5 MG tablet   Encounter for general adult medical examination with abnormal findings - Primary    Reviewed health habits including diet and exercise and skin cancer prevention Reviewed appropriate screening tests for age  Also reviewed health mt list, fam hx and immunization status , as well as social and family history   amw reviewed  Labs reviewed  Flu shot given  Planning covid vaccine  Disc shingrix-will get at pharmacy if affordable  dexa -nl in 2017 , no falls or fx and counseled re: ca and D supplement , good exercise        Obesity    Discussed how this problem influences overall health and the risks it imposes  Reviewed plan for weight loss with lower calorie diet (via better food choices and also portion control or program like weight watchers) and exercise building up to or more than 30 minutes 5 days per week including some aerobic activity         Elevated glucose level    Lab Results  Component Value Date   HGBA1C 5.2 12/16/2019   disc imp of low glycemic diet and wt loss to prevent DM2       Left leg pain    L lateral leg pain-seeing neurosurg Dr Arnoldo Morale  MRI shows mild deg disc dz-rev report today  In a flare-px pred 40 mg taper with warning re: side eff  Will need to f/u with her  treating provider  ? Considering epidural injection        Other Visit Diagnoses    Need for influenza vaccination       Relevant Orders   Flu Vaccine QUAD High Dose(Fluad) (Completed)

## 2019-12-29 NOTE — Assessment & Plan Note (Signed)
L lateral leg pain-seeing neurosurg Dr Arnoldo Morale  MRI shows mild deg disc dz-rev report today  In a flare-px pred 40 mg taper with warning re: side eff  Will need to f/u with her treating provider  ? Considering epidural injection

## 2019-12-29 NOTE — Assessment & Plan Note (Signed)
bp in fair control at this time  BP Readings from Last 1 Encounters:  12/29/19 132/70   No changes needed Most recent labs reviewed  Disc lifstyle change with low sodium diet and exercise  Plan to continue amlodipine 5 mg daily and metoprolol xl 25 mg daily  Sees cardiology also

## 2019-12-29 NOTE — Assessment & Plan Note (Signed)
Disc goals for lipids and reasons to control them Rev last labs with pt Rev low sat fat diet in detail Well controlled with crestor 10 mg daily

## 2019-12-29 NOTE — Assessment & Plan Note (Signed)
Reviewed health habits including diet and exercise and skin cancer prevention Reviewed appropriate screening tests for age  Also reviewed health mt list, fam hx and immunization status , as well as social and family history   amw reviewed  Labs reviewed  Flu shot given  Planning covid vaccine  Disc shingrix-will get at pharmacy if affordable  dexa -nl in 2017 , no falls or fx and counseled re: ca and D supplement , good exercise

## 2020-01-20 ENCOUNTER — Ambulatory Visit: Payer: PPO | Admitting: Cardiology

## 2020-03-02 DIAGNOSIS — M25552 Pain in left hip: Secondary | ICD-10-CM | POA: Diagnosis not present

## 2020-03-02 DIAGNOSIS — M5442 Lumbago with sciatica, left side: Secondary | ICD-10-CM | POA: Diagnosis not present

## 2020-03-03 DIAGNOSIS — M25552 Pain in left hip: Secondary | ICD-10-CM | POA: Diagnosis not present

## 2020-03-03 DIAGNOSIS — M25551 Pain in right hip: Secondary | ICD-10-CM | POA: Diagnosis not present

## 2020-03-14 DIAGNOSIS — M9904 Segmental and somatic dysfunction of sacral region: Secondary | ICD-10-CM | POA: Diagnosis not present

## 2020-03-14 DIAGNOSIS — M9903 Segmental and somatic dysfunction of lumbar region: Secondary | ICD-10-CM | POA: Diagnosis not present

## 2020-03-14 DIAGNOSIS — M461 Sacroiliitis, not elsewhere classified: Secondary | ICD-10-CM | POA: Diagnosis not present

## 2020-03-14 DIAGNOSIS — M5442 Lumbago with sciatica, left side: Secondary | ICD-10-CM | POA: Diagnosis not present

## 2020-03-15 DIAGNOSIS — M9904 Segmental and somatic dysfunction of sacral region: Secondary | ICD-10-CM | POA: Diagnosis not present

## 2020-03-15 DIAGNOSIS — M9903 Segmental and somatic dysfunction of lumbar region: Secondary | ICD-10-CM | POA: Diagnosis not present

## 2020-03-15 DIAGNOSIS — M461 Sacroiliitis, not elsewhere classified: Secondary | ICD-10-CM | POA: Diagnosis not present

## 2020-03-15 DIAGNOSIS — M5442 Lumbago with sciatica, left side: Secondary | ICD-10-CM | POA: Diagnosis not present

## 2020-03-16 ENCOUNTER — Telehealth: Payer: Self-pay | Admitting: *Deleted

## 2020-03-16 NOTE — Telephone Encounter (Signed)
I would need to see and examine her to assess pulses/etc before referring.

## 2020-03-16 NOTE — Telephone Encounter (Signed)
Patient scheduled Dr.Tower's next available appointment on 03/28/20.  Patient asked if Dr.Tower has a cancellation, to let her know.

## 2020-03-16 NOTE — Telephone Encounter (Signed)
Please see note, pt needs an appt for eval/ discuss referral appt

## 2020-03-16 NOTE — Telephone Encounter (Signed)
Patient called stating that she has been seeing a neurosurgeon for several weeks because of leg pain. Patient stated that they feel that the pain may be coming from her hip. Patient stating that she is wondering if the pain is coming from a nerve or a blockage. Patient stated that the PA at the neurosurgeon's office is setting her up for a MRI. Patient stated that she PA at the surgeons's office advised her to contact her PCP to see if she thinks that she should see a vascular doctor. Patient stated if  Dr. Glori Bickers feels that she needs to see a vascular doctor she would like to see someone in [place].

## 2020-03-17 ENCOUNTER — Other Ambulatory Visit: Payer: Self-pay | Admitting: Neurosurgery

## 2020-03-17 DIAGNOSIS — M25552 Pain in left hip: Secondary | ICD-10-CM

## 2020-03-17 DIAGNOSIS — M461 Sacroiliitis, not elsewhere classified: Secondary | ICD-10-CM | POA: Diagnosis not present

## 2020-03-17 DIAGNOSIS — M25551 Pain in right hip: Secondary | ICD-10-CM

## 2020-03-17 DIAGNOSIS — M9903 Segmental and somatic dysfunction of lumbar region: Secondary | ICD-10-CM | POA: Diagnosis not present

## 2020-03-17 DIAGNOSIS — M5442 Lumbago with sciatica, left side: Secondary | ICD-10-CM | POA: Diagnosis not present

## 2020-03-17 DIAGNOSIS — M9904 Segmental and somatic dysfunction of sacral region: Secondary | ICD-10-CM | POA: Diagnosis not present

## 2020-03-18 NOTE — Telephone Encounter (Signed)
Patient rescheduled appointment to 03/21/20.

## 2020-03-18 NOTE — Telephone Encounter (Signed)
Schedule has been opened up for next week can see if pt wants a sooner appt

## 2020-03-21 ENCOUNTER — Ambulatory Visit (INDEPENDENT_AMBULATORY_CARE_PROVIDER_SITE_OTHER): Payer: PPO | Admitting: Family Medicine

## 2020-03-21 ENCOUNTER — Encounter: Payer: Self-pay | Admitting: Family Medicine

## 2020-03-21 ENCOUNTER — Other Ambulatory Visit: Payer: Self-pay

## 2020-03-21 VITALS — BP 130/78 | HR 70 | Temp 96.9°F | Ht 66.5 in | Wt 194.1 lb

## 2020-03-21 DIAGNOSIS — M9904 Segmental and somatic dysfunction of sacral region: Secondary | ICD-10-CM | POA: Diagnosis not present

## 2020-03-21 DIAGNOSIS — M461 Sacroiliitis, not elsewhere classified: Secondary | ICD-10-CM | POA: Diagnosis not present

## 2020-03-21 DIAGNOSIS — M9903 Segmental and somatic dysfunction of lumbar region: Secondary | ICD-10-CM | POA: Diagnosis not present

## 2020-03-21 DIAGNOSIS — M79605 Pain in left leg: Secondary | ICD-10-CM

## 2020-03-21 DIAGNOSIS — M5442 Lumbago with sciatica, left side: Secondary | ICD-10-CM | POA: Diagnosis not present

## 2020-03-21 NOTE — Progress Notes (Signed)
Subjective:    Patient ID: Robin Gilmore, female    DOB: 24-Dec-1950, 70 y.o.   MRN: [id number]  This visit occurred during the SARS-CoV-2 public health emergency.  Safety protocols were in place, including screening questions prior to the visit, additional usage of staff PPE, and extensive cleaning of exam room while observing appropriate contact time as indicated for disinfecting solutions.    HPI Pt presents for c/o hip pain   Wt Readings from Last 3 Encounters:  03/21/20 194 lb 1 oz (88 kg)  12/29/19 191 lb 7 oz (86.8 kg)  12/03/19 193 lb (87.5 kg)   30.85 kg/m  She has seen neurosurg (Dr Arnoldo Morale) for L leg pain and has mild lumbar DDD on MRI  Was treated with prednisone and it helped (before holidays)   Last MRI- may   Now pain is worse-after yard work From L knee up to lateral hip and back  F/u with her neurosurgeon and they did xrays (saw PA there)  Did xray of hips and hip injections (? What was injected)  Has some groin pain as well   Last LS MRI 5/21 MR [place] (Accession [id number]) (Order [id number]) Imaging Date: 05/31/2019 Department: [facility] Released By: Dene Gentry Authorizing: Newman Pies, MD    Exam Status  Status  Final [99]   PACS Intelerad Image Link  Show images for MR LUMBAR SPINE WO CONTRAST  Study Result  Narrative & Impression  CLINICAL DATA:  Left low back pain and left sciatica  EXAM: MRI LUMBAR SPINE WITHOUT CONTRAST  TECHNIQUE: Multiplanar, multisequence MR imaging of the lumbar spine was performed. No intravenous contrast was administered.  COMPARISON:  None.  FINDINGS: Segmentation:  Standard.  Alignment:  Trace anterolisthesis at L3-L4.  Vertebrae:  Vertebral body heights are maintained.  Conus medullaris and cauda equina: Conus extends to the L1 level. Conus and cauda equina appear normal.  Paraspinal and other soft tissues: Unremarkable.  Disc  levels:  T12-L1: Very small right central disc protrusion. No canal or foraminal stenosis.  L1-L2: Very small central protrusion. No canal or foraminal stenosis.  L2-L3:  Mild disc bulge.  No canal or foraminal stenosis.  L3-L4:  Mild disc bulge.  No canal or foraminal stenosis.  L4-L5: Mild disc bulge slightly eccentric to the right. No canal or left foraminal stenosis. Minor right foraminal stenosis.  L5-S1:  No canal or foraminal stenosis.  IMPRESSION: Minor degenerative changes without significant stenosis.   Electronically Signed   By: Macy Mis M.D.   On: 06/01/2019 08:55       Has weakness in L leg - hard to lift it  ? Due to not using  No foot drop  Feels a little numb to soft touch in lateral thigh   Has hip MRI next Monday   Pt notes she has had leg pain for years (upper thigh)  Thinks she has a pinched nerve   She sees [facility] for ortho  PA [place]ge[place]   Takes gabapentin 600 mg twice daily  Makes her groggy   Takes tramadol tid - helps some   Going to chiropractor   Patient Active Problem List   Diagnosis Date Noted  . Left leg pain 12/29/2019  . Coronary aneurysm 12/29/2019  . Essential hypertension 11/10/2018  . Elevated glucose level 11/10/2018  . Mitral valve prolapse 11/04/2017  . Estrogen deficiency 08/09/2015  . Obesity 11/18/2013  . Encounter for routine gynecological examination 06/30/2012  .  Encounter for general adult medical examination with abnormal findings 01/29/2011  . Family history of colonic polyps 01/29/2011  . Hypothyroidism 05/16/2009  . Hyperlipidemia LDL goal <130 05/16/2009  . CYSTOCELE WITHOUT MENTION UTERINE PROLAPSE LAT 05/16/2009   Past Medical History:  Diagnosis Date  . Constipation   . Hyperlipidemia   . Hypothyroid   . IBS (irritable bowel syndrome)   . Menopausal syndrome   . Mitral prolapse and regurge used to have prophylaxis  . Obesity    Past Surgical History:  Procedure  Laterality Date  . BREAST EXCISIONAL BIOPSY Right   . BREAST SURGERY     breast biopsy x 2 benign  . CESAREAN SECTION    . retrovaginal fistula repair     Social History   Tobacco Use  . Smoking status: Former Research scientist (life sciences)  . Smokeless tobacco: Never Used  Vaping Use  . Vaping Use: Never used  Substance Use Topics  . Alcohol use: No    Alcohol/week: 0.0 standard drinks  . Drug use: No   Family History  Problem Relation Age of Onset  . Alcohol abuse Mother   . Diabetes Mother   . Heart disease Mother        ? MI CAD  . Cancer Father        lung CA  . Alcohol abuse Father   . Hypertension Sister   . Hyperlipidemia Sister   . Alcohol abuse Brother   . Colon polyps Brother   . Obesity Maternal Grandfather   . Breast cancer Neg Hx    Allergies  Allergen Reactions  . Codeine     REACTION: Severe nausea and vomitnig   Current Outpatient Medications on File Prior to Visit  Medication Sig Dispense Refill  . albuterol (PROVENTIL HFA;VENTOLIN HFA) 108 (90 Base) MCG/ACT inhaler Inhale 2 puffs into the lungs every 6 (six) hours as needed for wheezing or shortness of breath. 3 Inhaler 3  . amLODipine (NORVASC) 5 MG tablet Take 1 tablet (5 mg total) by mouth daily. 90 tablet 3  . baclofen (LIORESAL) 10 MG tablet Take 1 tablet by mouth 3 (three) times daily as needed.    . gabapentin (NEURONTIN) 300 MG capsule Take 300 mg by mouth as needed.    Marland Kitchen levothyroxine (SYNTHROID) 150 MCG tablet Take 1 tablet (150 mcg total) by mouth daily before breakfast. 90 tablet 3  . metoprolol succinate (TOPROL-XL) 25 MG 24 hr tablet Take 1 tablet (25 mg total) by mouth daily. 90 tablet 3  . rosuvastatin (CRESTOR) 10 MG tablet Take 1 tablet (10 mg total) by mouth daily. 90 tablet 3  . traMADol (ULTRAM) 50 MG tablet Take 1-2 tablets by mouth every 8 (eight) hours as needed.     No current facility-administered medications on file prior to visit.     Review of Systems  Constitutional: Negative for  activity change, appetite change, fatigue, fever and unexpected weight change.  HENT: Negative for congestion, ear pain, rhinorrhea, sinus pressure and sore throat.   Eyes: Negative for pain, redness and visual disturbance.  Respiratory: Negative for cough, shortness of breath and wheezing.   Cardiovascular: Negative for chest pain and palpitations.  Gastrointestinal: Negative for abdominal pain, blood in stool, constipation and diarrhea.  Endocrine: Negative for polydipsia and polyuria.  Genitourinary: Negative for dysuria, frequency and urgency.  Musculoskeletal: Positive for arthralgias and back pain. Negative for myalgias.  Skin: Negative for pallor and rash.  Allergic/Immunologic: Negative for environmental allergies.  Neurological: Positive for numbness.  Negative for dizziness, syncope and headaches.  Hematological: Negative for adenopathy. Does not bruise/bleed easily.  Psychiatric/Behavioral: Negative for decreased concentration and dysphoric mood. The patient is not nervous/anxious.        Objective:   Physical Exam Constitutional:      Appearance: Normal appearance. She is obese. She is not ill-appearing.  Eyes:     Conjunctiva/sclera: Conjunctivae normal.     Pupils: Pupils are equal, round, and reactive to light.  Cardiovascular:     Rate and Rhythm: Normal rate and regular rhythm.     Pulses: Normal pulses.     Comments: Normal pedal pulses Pulmonary:     Effort: Pulmonary effort is normal. No respiratory distress.     Breath sounds: Normal breath sounds.  Musculoskeletal:     Cervical back: Neck supple.     Comments: No LS tenderness Mild L greater trochanter area tenderness  Able to int/ext rotate L hip sitting Has groin pain L on standing  Walks with walker   Skin:    General: Skin is warm and dry.     Coloration: Skin is not pale.     Findings: No rash.  Neurological:     Mental Status: She is alert.     Sensory: No sensory deficit.     Motor: No  weakness.     Deep Tendon Reflexes: Reflexes normal.     Comments: Nl L foot/great toe dorsiflexion  Nl DTRs  Walks with walker due to pain  Psychiatric:        Mood and Affect: Mood normal.           Assessment & Plan:   Problem List Items Addressed This Visit      Other   Left leg pain - Primary    Pt sees neuro surg Dr Arnoldo Morale and MRI LS from May was not overwhelming (mild DDD) ? If there is a hip cause (bursitis vs OA) Her pain is both in lateral hip and in groin  Also had injections of ? Hips and MRI upcoming Has ortho f/u at [facility] later this month  I sent for her last 2 neurosurg progress notes Continues gabapentin 600 bid and tramadol tid with caution of sedation  Biggest question is where is pain coming from (hip or back)  Nl pulses today and no foot drop

## 2020-03-21 NOTE — Patient Instructions (Addendum)
I want to send for your last 2 neuro surgery notes   Follow up with orthopedics as planned  Get your hip MRI   I wonder if physical therapy or if epidural spine injection would help   For pain on outer thigh -try ice for 10 minutes at a time to the sore area   (this would help hip bursitis)   Continue the gabapentin

## 2020-03-21 NOTE — Assessment & Plan Note (Signed)
Pt sees neuro surg Dr Arnoldo Morale and MRI LS from May was not overwhelming (mild DDD) ? If there is a hip cause (bursitis vs OA) Her pain is both in lateral hip and in groin  Also had injections of ? Hips and MRI upcoming Has ortho f/u at [facility] later this month  I sent for her last 2 neurosurg progress notes Continues gabapentin 600 bid and tramadol tid with caution of sedation  Biggest question is where is pain coming from (hip or back)  Nl pulses today and no foot drop

## 2020-03-24 DIAGNOSIS — M5442 Lumbago with sciatica, left side: Secondary | ICD-10-CM | POA: Diagnosis not present

## 2020-03-24 DIAGNOSIS — M9903 Segmental and somatic dysfunction of lumbar region: Secondary | ICD-10-CM | POA: Diagnosis not present

## 2020-03-24 DIAGNOSIS — M461 Sacroiliitis, not elsewhere classified: Secondary | ICD-10-CM | POA: Diagnosis not present

## 2020-03-24 DIAGNOSIS — M9904 Segmental and somatic dysfunction of sacral region: Secondary | ICD-10-CM | POA: Diagnosis not present

## 2020-03-28 ENCOUNTER — Ambulatory Visit: Payer: PPO | Admitting: Family Medicine

## 2020-03-28 DIAGNOSIS — M24151 Other articular cartilage disorders, right hip: Secondary | ICD-10-CM | POA: Diagnosis not present

## 2020-03-28 DIAGNOSIS — M24152 Other articular cartilage disorders, left hip: Secondary | ICD-10-CM | POA: Diagnosis not present

## 2020-03-28 DIAGNOSIS — M67951 Unspecified disorder of synovium and tendon, right thigh: Secondary | ICD-10-CM | POA: Diagnosis not present

## 2020-03-28 DIAGNOSIS — M67952 Unspecified disorder of synovium and tendon, left thigh: Secondary | ICD-10-CM | POA: Diagnosis not present

## 2020-03-29 DIAGNOSIS — G8929 Other chronic pain: Secondary | ICD-10-CM | POA: Diagnosis not present

## 2020-03-29 DIAGNOSIS — M7062 Trochanteric bursitis, left hip: Secondary | ICD-10-CM | POA: Diagnosis not present

## 2020-03-29 DIAGNOSIS — M25852 Other specified joint disorders, left hip: Secondary | ICD-10-CM | POA: Diagnosis not present

## 2020-03-29 DIAGNOSIS — M5442 Lumbago with sciatica, left side: Secondary | ICD-10-CM | POA: Diagnosis not present

## 2020-03-29 DIAGNOSIS — M545 Low back pain, unspecified: Secondary | ICD-10-CM | POA: Diagnosis not present

## 2020-03-29 DIAGNOSIS — S76012A Strain of muscle, fascia and tendon of left hip, initial encounter: Secondary | ICD-10-CM | POA: Diagnosis not present

## 2020-03-29 DIAGNOSIS — M461 Sacroiliitis, not elsewhere classified: Secondary | ICD-10-CM | POA: Diagnosis not present

## 2020-03-29 DIAGNOSIS — M9903 Segmental and somatic dysfunction of lumbar region: Secondary | ICD-10-CM | POA: Diagnosis not present

## 2020-03-29 DIAGNOSIS — M9904 Segmental and somatic dysfunction of sacral region: Secondary | ICD-10-CM | POA: Diagnosis not present

## 2020-03-29 DIAGNOSIS — S73192A Other sprain of left hip, initial encounter: Secondary | ICD-10-CM | POA: Diagnosis not present

## 2020-03-31 ENCOUNTER — Other Ambulatory Visit: Payer: PPO

## 2020-03-31 ENCOUNTER — Ambulatory Visit: Payer: PPO

## 2020-04-12 DIAGNOSIS — M461 Sacroiliitis, not elsewhere classified: Secondary | ICD-10-CM | POA: Diagnosis not present

## 2020-04-12 DIAGNOSIS — M5442 Lumbago with sciatica, left side: Secondary | ICD-10-CM | POA: Diagnosis not present

## 2020-04-12 DIAGNOSIS — M9904 Segmental and somatic dysfunction of sacral region: Secondary | ICD-10-CM | POA: Diagnosis not present

## 2020-04-12 DIAGNOSIS — M9903 Segmental and somatic dysfunction of lumbar region: Secondary | ICD-10-CM | POA: Diagnosis not present

## 2020-05-09 ENCOUNTER — Encounter: Payer: Self-pay | Admitting: Family Medicine

## 2020-05-10 ENCOUNTER — Other Ambulatory Visit: Payer: Self-pay | Admitting: *Deleted

## 2020-05-10 DIAGNOSIS — I10 Essential (primary) hypertension: Secondary | ICD-10-CM

## 2020-05-10 DIAGNOSIS — M9903 Segmental and somatic dysfunction of lumbar region: Secondary | ICD-10-CM | POA: Diagnosis not present

## 2020-05-10 DIAGNOSIS — M5442 Lumbago with sciatica, left side: Secondary | ICD-10-CM | POA: Diagnosis not present

## 2020-05-10 DIAGNOSIS — M9904 Segmental and somatic dysfunction of sacral region: Secondary | ICD-10-CM | POA: Diagnosis not present

## 2020-05-10 DIAGNOSIS — M461 Sacroiliitis, not elsewhere classified: Secondary | ICD-10-CM | POA: Diagnosis not present

## 2020-05-10 DIAGNOSIS — Z01812 Encounter for preprocedural laboratory examination: Secondary | ICD-10-CM

## 2020-05-10 DIAGNOSIS — I2541 Coronary artery aneurysm: Secondary | ICD-10-CM

## 2020-05-16 ENCOUNTER — Encounter: Payer: Self-pay | Admitting: Family Medicine

## 2020-06-10 ENCOUNTER — Other Ambulatory Visit: Payer: Self-pay

## 2020-06-10 ENCOUNTER — Other Ambulatory Visit: Payer: PPO | Admitting: *Deleted

## 2020-06-10 DIAGNOSIS — I10 Essential (primary) hypertension: Secondary | ICD-10-CM | POA: Diagnosis not present

## 2020-06-10 DIAGNOSIS — I2541 Coronary artery aneurysm: Secondary | ICD-10-CM | POA: Diagnosis not present

## 2020-06-10 DIAGNOSIS — Z01812 Encounter for preprocedural laboratory examination: Secondary | ICD-10-CM | POA: Diagnosis not present

## 2020-06-10 LAB — BASIC METABOLIC PANEL
BUN/Creatinine Ratio: 29 — ABNORMAL HIGH (ref 12–28)
BUN: 17 mg/dL (ref 8–27)
CO2: 25 mmol/L (ref 20–29)
Calcium: 10.1 mg/dL (ref 8.7–10.3)
Chloride: 100 mmol/L (ref 96–106)
Creatinine, Ser: 0.59 mg/dL (ref 0.57–1.00)
Glucose: 142 mg/dL — ABNORMAL HIGH (ref 65–99)
Potassium: 4.1 mmol/L (ref 3.5–5.2)
Sodium: 140 mmol/L (ref 134–144)
eGFR: 96 mL/min/{1.73_m2} (ref >59)

## 2020-06-14 ENCOUNTER — Telehealth (HOSPITAL_COMMUNITY): Payer: Self-pay | Admitting: Emergency Medicine

## 2020-06-14 NOTE — Telephone Encounter (Signed)
Reaching out to patient to offer assistance regarding upcoming cardiac imaging study; pt verbalizes understanding of appt date/time, parking situation and where to check in, pre-test NPO status and medications ordered, and verified current allergies; name and call back number provided for further questions should they arise Tinlee Navarrette RN Navigator Cardiac Imaging [facility] [phone] office [phone] cell 

## 2020-06-15 ENCOUNTER — Encounter (HOSPITAL_COMMUNITY): Payer: Self-pay

## 2020-06-15 ENCOUNTER — Other Ambulatory Visit: Payer: Self-pay

## 2020-06-15 ENCOUNTER — Ambulatory Visit (HOSPITAL_COMMUNITY)
Admission: RE | Admit: 2020-06-15 | Discharge: 2020-06-15 | Disposition: A | Discharge status: Home / Self Care | Payer: PPO | Source: Ambulatory Visit | Attending: Cardiology | Admitting: Cardiology

## 2020-06-15 DIAGNOSIS — R918 Other nonspecific abnormal finding of lung field: Secondary | ICD-10-CM | POA: Insufficient documentation

## 2020-06-15 DIAGNOSIS — I10 Essential (primary) hypertension: Secondary | ICD-10-CM | POA: Insufficient documentation

## 2020-06-15 DIAGNOSIS — I2541 Coronary artery aneurysm: Secondary | ICD-10-CM | POA: Insufficient documentation

## 2020-06-15 DIAGNOSIS — E785 Hyperlipidemia, unspecified: Secondary | ICD-10-CM | POA: Diagnosis not present

## 2020-06-15 MED ORDER — METOPROLOL TARTRATE 5 MG/5ML IV SOLN: 10.0000 mg | INTRAVENOUS | Status: DC | PRN

## 2020-06-15 MED ORDER — NITROGLYCERIN 0.4 MG SL SUBL
SUBLINGUAL_TABLET | SUBLINGUAL | Status: AC
  Filled 2020-06-15: qty 2

## 2020-06-15 MED ORDER — METOPROLOL TARTRATE 5 MG/5ML IV SOLN
INTRAVENOUS | Status: AC
  Administered 2020-06-15: 10 mg via INTRAVENOUS
  Filled 2020-06-15: qty 10

## 2020-06-15 MED ORDER — NITROGLYCERIN 0.4 MG SL SUBL
0.8000 mg | SUBLINGUAL_TABLET | Freq: Once | SUBLINGUAL | Status: AC
  Administered 2020-06-15: 0.8 mg via SUBLINGUAL

## 2020-06-15 MED ORDER — IOHEXOL 350 MG/ML SOLN
95.0000 mL | Freq: Once | INTRAVENOUS | Status: AC | PRN
  Administered 2020-06-15: 95 mL via INTRAVENOUS

## 2020-06-21 DIAGNOSIS — M5442 Lumbago with sciatica, left side: Secondary | ICD-10-CM | POA: Diagnosis not present

## 2020-06-21 DIAGNOSIS — M9904 Segmental and somatic dysfunction of sacral region: Secondary | ICD-10-CM | POA: Diagnosis not present

## 2020-06-21 DIAGNOSIS — M9903 Segmental and somatic dysfunction of lumbar region: Secondary | ICD-10-CM | POA: Diagnosis not present

## 2020-06-21 DIAGNOSIS — M461 Sacroiliitis, not elsewhere classified: Secondary | ICD-10-CM | POA: Diagnosis not present

## 2020-06-23 ENCOUNTER — Other Ambulatory Visit: Payer: Self-pay | Admitting: *Deleted

## 2020-06-23 NOTE — Progress Notes (Signed)
CT scan is stable from prior study.  2 large calcified saccular aneurysms in the circumflex territory showed no change since November 2021.  Stable small right middle lobe and lower lobe nodules.  Per radiology no follow-up is needed.   Overall reassuring stable study.  Coronary aneurysms unchanged.   Since this was a 34-month follow-up, lets now increase the interval to 1 year and repeat coronary CT then to make sure that stability is still present.   Candee Furbish, MD   Orders placed in 07/06/2020 orders only encounter.

## 2020-07-06 ENCOUNTER — Other Ambulatory Visit: Payer: Self-pay | Admitting: *Deleted

## 2020-07-06 DIAGNOSIS — I2541 Coronary artery aneurysm: Secondary | ICD-10-CM

## 2020-07-06 DIAGNOSIS — Z01812 Encounter for preprocedural laboratory examination: Secondary | ICD-10-CM

## 2020-07-06 NOTE — Progress Notes (Signed)
CT scan is stable from prior study.  2 large calcified saccular aneurysms in the circumflex territory showed no change since November 2021.  Stable small right middle lobe and lower lobe nodules.  Per radiology no follow-up is needed.   Overall reassuring stable study.  Coronary aneurysms unchanged.   Since this was a 88-month follow-up, lets now increase the interval to 1 year and repeat coronary CT then to make sure that stability is still present.   Robin Furbish, MD    Order placed for coronary CT  and BMP in 1 year per Dr Marlou Porch.

## 2020-07-26 DIAGNOSIS — M5442 Lumbago with sciatica, left side: Secondary | ICD-10-CM | POA: Diagnosis not present

## 2020-07-26 DIAGNOSIS — M461 Sacroiliitis, not elsewhere classified: Secondary | ICD-10-CM | POA: Diagnosis not present

## 2020-07-26 DIAGNOSIS — M9903 Segmental and somatic dysfunction of lumbar region: Secondary | ICD-10-CM | POA: Diagnosis not present

## 2020-07-26 DIAGNOSIS — M9904 Segmental and somatic dysfunction of sacral region: Secondary | ICD-10-CM | POA: Diagnosis not present

## 2020-08-30 DIAGNOSIS — M5442 Lumbago with sciatica, left side: Secondary | ICD-10-CM | POA: Diagnosis not present

## 2020-08-30 DIAGNOSIS — M461 Sacroiliitis, not elsewhere classified: Secondary | ICD-10-CM | POA: Diagnosis not present

## 2020-08-30 DIAGNOSIS — M9904 Segmental and somatic dysfunction of sacral region: Secondary | ICD-10-CM | POA: Diagnosis not present

## 2020-08-30 DIAGNOSIS — M9903 Segmental and somatic dysfunction of lumbar region: Secondary | ICD-10-CM | POA: Diagnosis not present

## 2020-09-28 ENCOUNTER — Other Ambulatory Visit: Payer: Self-pay

## 2020-09-28 MED ORDER — ROSUVASTATIN CALCIUM 10 MG PO TABS
10.0000 mg | ORAL_TABLET | Freq: Every day | ORAL | 0 refills | Status: DC
Start: 2020-09-28 — End: 2020-11-30

## 2020-09-29 ENCOUNTER — Telehealth: Payer: Self-pay | Admitting: Cardiology

## 2020-09-29 MED ORDER — METOPROLOL SUCCINATE ER 25 MG PO TB24
25.0000 mg | ORAL_TABLET | Freq: Every day | ORAL | 1 refills | Status: DC
Start: 2020-09-29 — End: 2021-02-03

## 2020-09-29 NOTE — Telephone Encounter (Signed)
*  STAT* If patient is at the pharmacy, call can be transferred to refill team.   1. Which medications need to be refilled? (please list name of each medication and dose if known) metoprolol succinate (TOPROL-XL) 25 MG 24 hr tablet  2. Which pharmacy/location (including street and city if local pharmacy) is medication to be sent to? [facility] [place] - [place], [place]  3. Do they need a 30 day or 90 day supply? 90 day supply  Pt has an upcoming appt with [facility] in January

## 2020-09-29 NOTE — Telephone Encounter (Signed)
Pt's medication was sent to pt's pharmacy as requested. Confirmation received.  °

## 2020-10-13 ENCOUNTER — Other Ambulatory Visit: Payer: Self-pay | Admitting: Family Medicine

## 2020-10-13 DIAGNOSIS — Z1231 Encounter for screening mammogram for malignant neoplasm of breast: Secondary | ICD-10-CM

## 2020-10-17 DIAGNOSIS — M5442 Lumbago with sciatica, left side: Secondary | ICD-10-CM | POA: Diagnosis not present

## 2020-10-17 DIAGNOSIS — M461 Sacroiliitis, not elsewhere classified: Secondary | ICD-10-CM | POA: Diagnosis not present

## 2020-10-17 DIAGNOSIS — M9903 Segmental and somatic dysfunction of lumbar region: Secondary | ICD-10-CM | POA: Diagnosis not present

## 2020-10-17 DIAGNOSIS — M9904 Segmental and somatic dysfunction of sacral region: Secondary | ICD-10-CM | POA: Diagnosis not present

## 2020-10-18 DIAGNOSIS — H25813 Combined forms of age-related cataract, bilateral: Secondary | ICD-10-CM | POA: Diagnosis not present

## 2020-10-24 DIAGNOSIS — H9202 Otalgia, left ear: Secondary | ICD-10-CM | POA: Diagnosis not present

## 2020-10-24 DIAGNOSIS — H8112 Benign paroxysmal vertigo, left ear: Secondary | ICD-10-CM | POA: Diagnosis not present

## 2020-11-02 DIAGNOSIS — H02403 Unspecified ptosis of bilateral eyelids: Secondary | ICD-10-CM | POA: Diagnosis not present

## 2020-11-30 ENCOUNTER — Other Ambulatory Visit: Payer: Self-pay

## 2020-11-30 MED ORDER — ROSUVASTATIN CALCIUM 10 MG PO TABS
10.0000 mg | ORAL_TABLET | Freq: Every day | ORAL | 0 refills | Status: DC
Start: 2020-11-30 — End: 2021-02-07

## 2020-12-19 ENCOUNTER — Telehealth: Payer: Self-pay | Admitting: Family Medicine

## 2020-12-19 MED ORDER — LEVOTHYROXINE SODIUM 150 MCG PO TABS
150.0000 ug | ORAL_TABLET | Freq: Every day | ORAL | 0 refills | Status: DC
Start: 2020-12-19 — End: 2021-04-27

## 2020-12-19 NOTE — Telephone Encounter (Signed)
Med refilled.

## 2020-12-19 NOTE — Telephone Encounter (Signed)
  Encourage patient to contact the pharmacy for refills or they can request refills through [place]:  Please schedule appointment if longer than 1 year  NEXT APPOINTMENT DATE:  MEDICATION:levothyroxine (SYNTHROID) 150 MCG tablet   Is the patient out of medication?   [place] ([place], [place]   Let patient know to contact pharmacy at the end of the day to make sure medication is ready.  Please notify patient to allow 48-72 hours to process  CLINICAL FILLS OUT ALL BELOW:   LAST REFILL:  QTY:  REFILL DATE:    OTHER COMMENTS:    Okay for refill?  Please advise

## 2020-12-20 ENCOUNTER — Ambulatory Visit: Payer: PPO

## 2020-12-22 DIAGNOSIS — U071 COVID-19: Secondary | ICD-10-CM | POA: Diagnosis not present

## 2020-12-22 DIAGNOSIS — Z03818 Encounter for observation for suspected exposure to other biological agents ruled out: Secondary | ICD-10-CM | POA: Diagnosis not present

## 2021-01-03 DIAGNOSIS — H25043 Posterior subcapsular polar age-related cataract, bilateral: Secondary | ICD-10-CM | POA: Diagnosis not present

## 2021-01-03 DIAGNOSIS — H18413 Arcus senilis, bilateral: Secondary | ICD-10-CM | POA: Diagnosis not present

## 2021-01-03 DIAGNOSIS — H25013 Cortical age-related cataract, bilateral: Secondary | ICD-10-CM | POA: Diagnosis not present

## 2021-01-03 DIAGNOSIS — H2513 Age-related nuclear cataract, bilateral: Secondary | ICD-10-CM | POA: Diagnosis not present

## 2021-01-03 DIAGNOSIS — H2511 Age-related nuclear cataract, right eye: Secondary | ICD-10-CM | POA: Diagnosis not present

## 2021-01-19 ENCOUNTER — Other Ambulatory Visit: Payer: PPO

## 2021-01-25 ENCOUNTER — Encounter: Payer: PPO | Admitting: Family Medicine

## 2021-01-30 ENCOUNTER — Telehealth: Payer: Self-pay | Admitting: Family Medicine

## 2021-01-30 DIAGNOSIS — E785 Hyperlipidemia, unspecified: Secondary | ICD-10-CM

## 2021-01-30 DIAGNOSIS — R7309 Other abnormal glucose: Secondary | ICD-10-CM

## 2021-01-30 DIAGNOSIS — E039 Hypothyroidism, unspecified: Secondary | ICD-10-CM

## 2021-01-30 DIAGNOSIS — I10 Essential (primary) hypertension: Secondary | ICD-10-CM

## 2021-01-30 NOTE — Telephone Encounter (Signed)
-----   Message from Ellamae Sia sent at 01/18/2021  9:54 AM EST ----- Regarding: Lab orders for Tuesday, 1.17.23 Patient is scheduled for CPX labs, please order future labs, Thanks , Karna Christmas

## 2021-01-31 ENCOUNTER — Other Ambulatory Visit: Payer: PPO

## 2021-02-03 ENCOUNTER — Ambulatory Visit
Admission: RE | Admit: 2021-02-03 | Discharge: 2021-02-03 | Disposition: A | Discharge status: Home / Self Care | Payer: PPO | Source: Ambulatory Visit | Attending: Family Medicine | Admitting: Family Medicine

## 2021-02-03 ENCOUNTER — Encounter: Payer: Self-pay | Admitting: Cardiology

## 2021-02-03 ENCOUNTER — Other Ambulatory Visit: Payer: Self-pay | Admitting: Family Medicine

## 2021-02-03 ENCOUNTER — Other Ambulatory Visit: Payer: Self-pay

## 2021-02-03 ENCOUNTER — Ambulatory Visit: Payer: PPO | Admitting: Cardiology

## 2021-02-03 VITALS — BP 150/90 | HR 87 | Ht 66.5 in | Wt 199.0 lb

## 2021-02-03 DIAGNOSIS — Z1231 Encounter for screening mammogram for malignant neoplasm of breast: Secondary | ICD-10-CM | POA: Diagnosis not present

## 2021-02-03 DIAGNOSIS — I2541 Coronary artery aneurysm: Secondary | ICD-10-CM

## 2021-02-03 DIAGNOSIS — Q245 Malformation of coronary vessels: Secondary | ICD-10-CM

## 2021-02-03 DIAGNOSIS — E785 Hyperlipidemia, unspecified: Secondary | ICD-10-CM | POA: Diagnosis not present

## 2021-02-03 DIAGNOSIS — I10 Essential (primary) hypertension: Secondary | ICD-10-CM

## 2021-02-03 MED ORDER — METOPROLOL SUCCINATE ER 50 MG PO TB24: 50.0000 mg | ORAL_TABLET | Freq: Every day | ORAL | 3 refills | Status: DC

## 2021-02-03 NOTE — Patient Instructions (Signed)
Medication Instructions:  Please increase your Metoprolol Succinate to 50 mg a day. Continue all other medications as listed.  *If you need a refill on your cardiac medications before your next appointment, please call your pharmacy*  Follow-Up: At [facility], you and your health needs are our priority.  As part of our continuing mission to provide you with exceptional heart care, we have created designated Provider Care Teams.  These Care Teams include your primary Cardiologist (physician) and Advanced Practice Providers (APPs -  Physician Assistants and Nurse Practitioners) who all work together to provide you with the care you need, when you need it.  We recommend signing up for the patient portal called "MyChart".  Sign up information is provided on this After Visit Summary.  MyChart is used to connect with patients for Virtual Visits (Telemedicine).  Patients are able to view lab/test results, encounter notes, upcoming appointments, etc.  Non-urgent messages can be sent to your provider as well.   To learn more about what you can do with MyChart, go to [email or web address].    Your next appointment:   6 month(s)  The format for your next appointment:   In Person  Provider:   Candee Furbish, MD     Thank you for choosing [facility]!!

## 2021-02-03 NOTE — Assessment & Plan Note (Signed)
We will go ahead and increase her Toprol to 50 mg.  Continue with amlodipine 5 mg.  Next step would be to add ARB for instance such as irbesartan.

## 2021-02-03 NOTE — Assessment & Plan Note (Addendum)
She is going to go for a 20-month CT coronary in June.  If aneurysms are unchanged, we will skip the next year and do a 2-year follow-up.  Once again this was discussed with multiple team members including interventional cardiology and cardiothoracic surgery.  Multidisciplinary approach.

## 2021-02-03 NOTE — Progress Notes (Signed)
Cardiology Office Note:    Date:  02/03/2021   ID:  Robin Gilmore, DOB 03-27-1950, MRN [id number]  PCP:  Abner Greenspan, MD   [facility] Providers Cardiologist:  Candee Furbish, MD     Referring MD: Abner Greenspan, MD    History of Present Illness:    Robin Gilmore is a 70 y.o. female here for follow-up of coronary artery disease, prior coronary fistula, coronary artery aneurysm seen on CT.  Her mother had bypass surgery.  Originally she is only with palpitations racing pain and chest pressure.  During the work-up, coronary CT scan uncovered the fistula as well as coronary artery aneurysms.  She has retired.  Has had some leg discomfort.  Has gained some weight.  Blood pressure has been somewhat elevated at home. She denies any chest pain fevers chills nausea vomiting syncope bleeding   Past Medical History:  Diagnosis Date   Constipation    Hyperlipidemia    Hypothyroid    IBS (irritable bowel syndrome)    Menopausal syndrome    Mitral prolapse and regurge used to have prophylaxis   Obesity     Past Surgical History:  Procedure Laterality Date   BREAST EXCISIONAL BIOPSY Right    BREAST SURGERY     breast biopsy x 2 benign   CESAREAN SECTION     retrovaginal fistula repair      Current Medications: Current Meds  Medication Sig   amLODipine (NORVASC) 5 MG tablet Take 1 tablet (5 mg total) by mouth daily.   gabapentin (NEURONTIN) 300 MG capsule Take 300 mg by mouth as needed.   levothyroxine (SYNTHROID) 150 MCG tablet Take 1 tablet (150 mcg total) by mouth daily before breakfast.   metoprolol succinate (TOPROL-XL) 50 MG 24 hr tablet Take 1 tablet (50 mg total) by mouth daily. Take with or immediately following a meal.   rosuvastatin (CRESTOR) 10 MG tablet Take 1 tablet (10 mg total) by mouth daily. Please keep upcoming appt with Dr. Marlou Porch January 2022 before anymore refills. Thank you   [DISCONTINUED] metoprolol succinate (TOPROL-XL) 25 MG 24 hr tablet  Take 1 tablet (25 mg total) by mouth daily. Please keep upcoming appt in January 2023 with Dr. Marlou Porch before anymore refills. Thank you     Allergies:   Codeine   Social History   Socioeconomic History   Marital status: Married    Spouse name: Not on file   Number of children: Not on file   Years of education: Not on file   Highest education level: Not on file  Occupational History   Not on file  Tobacco Use   Smoking status: Former   Smokeless tobacco: Never  Vaping Use   Vaping Use: Never used  Substance and Sexual Activity   Alcohol use: No    Alcohol/week: 0.0 standard drinks   Drug use: No   Sexual activity: Never  Other Topics Concern   Not on file  Social History Narrative   Not on file   Social Determinants of Health   Financial Resource Strain: Not on file  Food Insecurity: Not on file  Transportation Needs: Not on file  Physical Activity: Not on file  Stress: Not on file  Social Connections: Not on file     Family History: The patient's family history includes Alcohol abuse in her brother, father, and mother; Cancer in her father; Colon polyps in her brother; Diabetes in her mother; Heart disease in her mother; Hyperlipidemia in her  sister; Hypertension in her sister; Obesity in her maternal grandfather. There is no history of Breast cancer.  ROS:   Please see the history of present illness.     All other systems reviewed and are negative.  EKGs/Labs/Other Studies Reviewed:    The following studies were reviewed today: Coronary CT scan 11/19/2019: 1. Coronary calcium score of 1100. This was 97th percentile for age and sex matched control. Calcifications noted mainly in the left circumflex aneurysms.   2. Normal coronary origin with right dominance.   3. Two very large (gigantic), calcified coronary artery aneurysms located in the left circumflex causing minimal stenosis, <25%   4. CAD-RADS 1. Minimal non-obstructive CAD (0-24%).  Consider non-atherosclerotic causes of chest pain. Consider preventive therapy and risk factor modification.   Electronically Signed: By: Kate Sable M.D.  Echocardiogram 11/18/2019:   1. Left ventricular ejection fraction, by estimation, is 60 to 65%. The  left ventricle has normal function. The left ventricle has no regional  wall motion abnormalities. Left ventricular diastolic parameters are  consistent with Grade I diastolic  dysfunction (impaired relaxation). Elevated left ventricular end-diastolic  pressure. The average left ventricular global longitudinal strain is -31.2  %. The global longitudinal strain is normal.   2. Right ventricular systolic function is normal. The right ventricular  size is normal. There is normal pulmonary artery systolic pressure.   3. The mitral valve is normal in structure. Trivial mitral valve  regurgitation. No evidence of mitral stenosis.   4. The aortic valve is tricuspid. There is mild calcification of the  aortic valve. There is mild thickening of the aortic valve. Aortic valve  regurgitation is trivial. No aortic stenosis is present.   5. Aortic dilatation noted. There is mild dilatation of the ascending  aorta, measuring 39 mm.   6. The inferior vena cava is normal in size with greater than 50%  respiratory variability, suggesting right atrial pressure of 3 mmHg.   Zio patch monitor 11/24/2019: Sinus rhythm with average heart rate of 68 bpm. Rare episodes of paroxysmal atrial tachycardia, ranging from 10 beats at 150 bpm, to 22 seconds in duration at 100 bpm. 2 separate instances of second-degree heart block type I, Wenckebach-1 occurring at 6 AM another occurring at 8 AM on separate days. Rare PVC No pauses greater than 3 seconds, no atrial fibrillation, no ventricular tachycardia.   Prior episodes of tachycardia may have been paroxysmal atrial tachycardia.  At last clinic visit I started her on Toprol-XL 50 mg once a day.  Since she  does have Wenkebach, I would like to go ahead and decrease this to 25 mg once a day.    EKG:  EKG is  ordered today.  The ekg ordered today demonstrates sinus rhythm 87 no other changes  Recent Labs: 06/10/2020: BUN 17; Creatinine, Ser 0.59; Potassium 4.1; Sodium 140  Recent Lipid Panel    Component Value Date/Time   CHOL 162 12/16/2019 0815   TRIG 148.0 12/16/2019 0815   HDL 53.00 12/16/2019 0815   CHOLHDL 3 12/16/2019 0815   VLDL 29.6 12/16/2019 0815   LDLCALC 79 12/16/2019 0815   LDLDIRECT 90.0 11/03/2018 0840     Risk Assessment/Calculations:              Physical Exam:    VS:  BP (!) 150/90 (BP Location: Left Arm, Patient Position: Sitting, Cuff Size: Normal)    Pulse 87    Ht 5' 6.5" (1.689 m)    Wt 199 lb (  90.3 kg)    SpO2 94%    BMI 31.64 kg/m     Wt Readings from Last 3 Encounters:  02/03/21 199 lb (90.3 kg)  03/21/20 194 lb 1 oz (88 kg)  12/29/19 191 lb 7 oz (86.8 kg)     GEN:  Well nourished, well developed in no acute distress HEENT: Normal NECK: No JVD; No carotid bruits LYMPHATICS: No lymphadenopathy CARDIAC: RRR, no murmurs, no rubs, gallops RESPIRATORY:  Clear to auscultation without rales, wheezing or rhonchi  ABDOMEN: Soft, non-tender, non-distended MUSCULOSKELETAL:  No edema; No deformity  SKIN: Warm and dry NEUROLOGIC:  Alert and oriented x 3 PSYCHIATRIC:  Normal affect   ASSESSMENT:    1. Essential hypertension   2. Coronary aneurysm   3. Hyperlipidemia LDL goal <130   4. Congenital coronary artery fistula    PLAN:    In order of problems listed above:  Coronary aneurysm She is going to go for a 69-month CT coronary in June.  If aneurysms are unchanged, we will skip the next year and do a 2-year follow-up.  Once again this was discussed with multiple team members including interventional cardiology and cardiothoracic surgery.  Multidisciplinary approach.  Hyperlipidemia LDL goal <130 Continue with Crestor 10 mg.  Excellent.  No  myalgias.  Lab work reviewed.  Congenital coronary artery fistula Right coronary artery to left coronary fistula.  Tortuous vessel seen on CT scan and that there is no significant extravasation of contrast into the right PA.  The distal coronary and of the fistula tract forms an aneurysm.  Essential hypertension We will go ahead and increase her Toprol to 50 mg.  Continue with amlodipine 5 mg.  Next step would be to add ARB for instance such as irbesartan.       Medication Adjustments/Labs and Tests Ordered: Current medicines are reviewed at length with the patient today.  Concerns regarding medicines are outlined above.  Orders Placed This Encounter  Procedures   EKG 12-Lead   Meds ordered this encounter  Medications   metoprolol succinate (TOPROL-XL) 50 MG 24 hr tablet    Sig: Take 1 tablet (50 mg total) by mouth daily. Take with or immediately following a meal.    Dispense:  90 tablet    Refill:  3    Patient Instructions  Medication Instructions:  Please increase your Metoprolol Succinate to 50 mg a day. Continue all other medications as listed.  *If you need a refill on your cardiac medications before your next appointment, please call your pharmacy*  Follow-Up: At [facility], you and your health needs are our priority.  As part of our continuing mission to provide you with exceptional heart care, we have created designated Provider Care Teams.  These Care Teams include your primary Cardiologist (physician) and Advanced Practice Providers (APPs -  Physician Assistants and Nurse Practitioners) who all work together to provide you with the care you need, when you need it.  We recommend signing up for the patient portal called "MyChart".  Sign up information is provided on this After Visit Summary.  MyChart is used to connect with patients for Virtual Visits (Telemedicine).  Patients are able to view lab/test results, encounter notes, upcoming appointments, etc.  Non-urgent  messages can be sent to your provider as well.   To learn more about what you can do with MyChart, go to [email or web address].    Your next appointment:   6 month(s)  The format for your next appointment:   In  Person  Provider:   Candee Furbish, MD     Thank you for choosing [facility]!!      Signed, Candee Furbish, MD  02/03/2021 4:59 PM    [place]

## 2021-02-03 NOTE — Assessment & Plan Note (Signed)
Right coronary artery to left coronary fistula.  Tortuous vessel seen on CT scan and that there is no significant extravasation of contrast into the right PA.  The distal coronary and of the fistula tract forms an aneurysm.

## 2021-02-03 NOTE — Assessment & Plan Note (Signed)
Continue with Crestor 10 mg.  Excellent.  No myalgias.  Lab work reviewed.

## 2021-02-06 ENCOUNTER — Other Ambulatory Visit: Payer: Self-pay | Admitting: Cardiology

## 2021-02-06 ENCOUNTER — Other Ambulatory Visit: Payer: Self-pay | Admitting: Family Medicine

## 2021-02-06 NOTE — Telephone Encounter (Signed)
I think it was from neurosurgery for leg pain prior. Please call her and verify that I am taking over the px and that this is correct. Thanks

## 2021-02-06 NOTE — Telephone Encounter (Signed)
Doesn't look like PCP filled this med before, on med list as a historical entry. CPE scheduled 03/17/21

## 2021-02-06 NOTE — Telephone Encounter (Signed)
Patient called to follow up on refill request °

## 2021-02-07 ENCOUNTER — Encounter: Payer: Self-pay | Admitting: Cardiology

## 2021-02-07 ENCOUNTER — Encounter: Payer: PPO | Admitting: Family Medicine

## 2021-02-07 ENCOUNTER — Encounter: Payer: Self-pay | Admitting: Family Medicine

## 2021-02-07 ENCOUNTER — Other Ambulatory Visit: Payer: Self-pay

## 2021-02-07 MED ORDER — ROSUVASTATIN CALCIUM 10 MG PO TABS: 10.0000 mg | ORAL_TABLET | Freq: Every day | ORAL | 3 refills | Status: DC

## 2021-02-14 NOTE — Telephone Encounter (Signed)
Left VM requesting pt to call the office back 

## 2021-02-15 ENCOUNTER — Telehealth: Payer: Self-pay | Admitting: *Deleted

## 2021-02-15 MED ORDER — GABAPENTIN 300 MG PO CAPS: 300.0000 mg | ORAL_CAPSULE | Freq: Every day | ORAL | 0 refills | Status: DC | PRN

## 2021-02-15 NOTE — Telephone Encounter (Signed)
Pt did get med from neuro but hasn't needed to f/u back up with them. Pt said she does take med PRN when her nerve pain in her leg flares up. Pt is requesting PCP to take over Rx, and send to [facility]

## 2021-02-15 NOTE — Telephone Encounter (Signed)
PLEASE NOTE: All timestamps contained within this report are represented as [place] Standard Time. CONFIDENTIALTY NOTICE: This fax transmission is intended only for the addressee. It contains information that is legally privileged, confidential or otherwise protected from use or disclosure. If you are not the intended recipient, you are strictly prohibited from reviewing, disclosing, copying using or disseminating any of this information or taking any action in reliance on or regarding this information. If you have received this fax in error, please notify us immediately by telephone so that we can arrange for its return to [place]. Phone: [phone], Toll-Free: [phone], Fax: [phone] Page: 1 of 1 Call Id: [id number] [facility] - Client Nonclinical Telephone Record  [facility] - Client Client Site [facility] Provider Loura Pardon - MD Contact Type Call Who Is Calling Patient / Member / Family / Caregiver Caller Name [place] Phone Number [phone] Call Type Message Only Information Provided Reason for Call Returning a Call from the Office Initial [place] states she is returning a call back from office Disp. Time Disposition Final User 02/14/2021 5:27:34 PM General Information Provided Yes Jolaine Artist Call Closed By: Jolaine Artist Transaction Date/Time: 02/14/2021 5:24:24 PM (ET)

## 2021-02-15 NOTE — Telephone Encounter (Signed)
Pt returning your call

## 2021-02-15 NOTE — Telephone Encounter (Signed)
See refill request/ addressed there

## 2021-02-15 NOTE — Telephone Encounter (Signed)
Addressed through refill request.

## 2021-03-08 DIAGNOSIS — H02831 Dermatochalasis of right upper eyelid: Secondary | ICD-10-CM | POA: Diagnosis not present

## 2021-03-08 DIAGNOSIS — H2513 Age-related nuclear cataract, bilateral: Secondary | ICD-10-CM | POA: Diagnosis not present

## 2021-03-08 DIAGNOSIS — H25043 Posterior subcapsular polar age-related cataract, bilateral: Secondary | ICD-10-CM | POA: Diagnosis not present

## 2021-03-08 DIAGNOSIS — H25013 Cortical age-related cataract, bilateral: Secondary | ICD-10-CM | POA: Diagnosis not present

## 2021-03-17 ENCOUNTER — Ambulatory Visit (INDEPENDENT_AMBULATORY_CARE_PROVIDER_SITE_OTHER): Payer: PPO | Admitting: Family Medicine

## 2021-03-17 ENCOUNTER — Other Ambulatory Visit: Payer: Self-pay

## 2021-03-17 VITALS — BP 130/70 | HR 71 | Temp 97.8°F | Resp 16 | Ht 67.0 in | Wt 201.4 lb

## 2021-03-17 DIAGNOSIS — Z6830 Body mass index (BMI) 30.0-30.9, adult: Secondary | ICD-10-CM

## 2021-03-17 DIAGNOSIS — I10 Essential (primary) hypertension: Secondary | ICD-10-CM

## 2021-03-17 DIAGNOSIS — E6609 Other obesity due to excess calories: Secondary | ICD-10-CM | POA: Diagnosis not present

## 2021-03-17 DIAGNOSIS — R7309 Other abnormal glucose: Secondary | ICD-10-CM

## 2021-03-17 DIAGNOSIS — E039 Hypothyroidism, unspecified: Secondary | ICD-10-CM | POA: Diagnosis not present

## 2021-03-17 DIAGNOSIS — Z1211 Encounter for screening for malignant neoplasm of colon: Secondary | ICD-10-CM

## 2021-03-17 DIAGNOSIS — Z0000 Encounter for general adult medical examination without abnormal findings: Secondary | ICD-10-CM | POA: Diagnosis not present

## 2021-03-17 DIAGNOSIS — Z8371 Family history of colonic polyps: Secondary | ICD-10-CM

## 2021-03-17 DIAGNOSIS — E785 Hyperlipidemia, unspecified: Secondary | ICD-10-CM

## 2021-03-17 DIAGNOSIS — Z83719 Family history of colon polyps, unspecified: Secondary | ICD-10-CM

## 2021-03-17 NOTE — Progress Notes (Signed)
Subjective:    Patient ID: Robin Gilmore, female    DOB: Aug 28, 1950, 71 y.o.   MRN: [id number]  Reviewed health habits including diet and exercise and skin cancer prevention Reviewed appropriate screening tests for age  Also reviewed health mt list, fam hx and immunization status , as well as social and family history    HPI Pt presents for amw and health mt visit   I have personally reviewed the Medicare Annual Wellness questionnaire and have noted 1. The patient's medical and social history 2. Their use of alcohol, tobacco or illicit drugs 3. Their current medications and supplements 4. The patient's functional ability including ADL's, fall risks, home safety risks and hearing or visual             impairment. 5. Diet and physical activities 6. Evidence for depression or mood disorders  The patients weight, height, BMI have been recorded in the chart and visual acuity is per eye clinic.  I have made referrals, counseling and provided education to the patient based review of the above and I have provided the pt with a written personalized care plan for preventive services. Reviewed and updated provider list, see scanned forms.  See scanned forms.  Routine anticipatory guidance given to patient.  See health maintenance. Colon cancer screening  colonoscopy 03/2011   -chart says 5 y recall due to fam h/o colon polyps  Dr Candace Cruise  Breast cancer screening  mammogram 01/2021 Self breast exam -no lumps  Flu vaccine  11/2020  Covid immunized  Tetanus vaccine  Tdap 01/2011 /postponed for ins  Pneumovax up to date Zoster vaccine -interested in shingrix  Dexa 10/2015  bmd in nl range  Will get next January  Falls: none  Fractures: none  Supplements  tums , needs D Exercise : trying to do walking    Advance directive: given materials to work on  Cognitive function addressed- see scanned forms- and if abnormal then additional documentation follows.   No problems  Handles her own  affairs   PMH and SH reviewed  Meds, vitals, and allergies reviewed.   ROS: See HPI.  Otherwise negative.    Weight : Wt Readings from Last 3 Encounters:  03/17/21 201 lb 6.4 oz (91.4 kg)  02/03/21 199 lb (90.3 kg)  03/21/20 194 lb 1 oz (88 kg)   31.54 kg/m Gained and lost some  Watching sugar and carbs (husb is diabetic)   Still strugging with her back and her hips  Tries to go walk  1/2 mile maximum   Has not seen ortho in a while  Has seen neuro and chirpractor   Has scoliosis  Needs to have injections in her hips    Hearing/vision: Hearing Screening   500Hz  1000Hz  2000Hz  4000Hz   Right ear 40 40 40 40  Left ear 40 40 40 40  Eye exam is up to date    PHQ: Depression screen [facility] 2/9 03/19/2021 12/16/2019 11/03/2018 10/29/2017 08/15/2016  Decreased Interest 0 0 0 0 0  Down, Depressed, Hopeless 0 0 0 0 0  PHQ - 2 Score 0 0 0 0 0  Altered sleeping - 0 0 0 1  Tired, decreased energy - 0 0 0 1  Change in appetite - 0 0 0 1  Feeling bad or failure about yourself  - 0 0 0 0  Trouble concentrating - 0 0 0 0  Moving slowly or fidgety/restless - 0 0 0 0  Suicidal thoughts - 0 0  0 0  PHQ-9 Score - 0 0 0 3  Difficult doing work/chores - Not difficult at all Not difficult at all Not difficult at all -      ADLs: no help needed  Functionality:: excellent   Care team : Judah Chevere-pcp [facility]- cardiology [facility]- [facility]   HTN in setting of coronary aneurysm and coronary artery fistula bp is stable today  No cp or palpitations or headaches or edema  No side effects to medicines  BP Readings from Last 3 Encounters:  03/17/21 130/70  02/03/21 (!) 150/90  06/15/20 (!) 166/76     Improved bp today  Amlodipine 5 mg daily  Metoprolol xl 50 mg daily  Pulse Readings from Last 3 Encounters:  03/17/21 71  02/03/21 87  06/15/20 69     Lab Results  Component Value Date   CREATININE 0.59 06/10/2020   BUN 17 06/10/2020   NA 140 06/10/2020   K 4.1 06/10/2020   CL 100  06/10/2020   CO2 25 06/10/2020    Hypothyroidism  Pt has no clinical changes No change in energy level/ hair or skin/ edema and no tremor Lab Results  Component Value Date   TSH 1.33 12/16/2019     Elevated glucose Lab Results  Component Value Date   HGBA1C 5.2 12/16/2019   Hyperlipidemia Lab Results  Component Value Date   CHOL 162 12/16/2019   HDL 53.00 12/16/2019   LDLCALC 79 12/16/2019   LDLDIRECT 90.0 11/03/2018   TRIG 148.0 12/16/2019   CHOLHDL 3 12/16/2019   Takes crestor 10 mg daily   Patient Active Problem List   Diagnosis Date Noted   Routine general medical examination at a health care facility 03/19/2021   Medicare annual wellness visit, subsequent 03/17/2021   Colon cancer screening 03/17/2021   Congenital coronary artery fistula 02/03/2021   Left leg pain 12/29/2019   Coronary aneurysm 12/29/2019   Essential hypertension 11/10/2018   Elevated glucose level 11/10/2018   Mitral valve prolapse 11/04/2017   Estrogen deficiency 08/09/2015   Obesity 11/18/2013   Encounter for routine gynecological examination 06/30/2012   Encounter for general adult medical examination with abnormal findings 01/29/2011   Family history of colonic polyps 01/29/2011   Hypothyroidism 05/16/2009   Hyperlipidemia LDL goal <130 05/16/2009   CYSTOCELE WITHOUT MENTION UTERINE PROLAPSE LAT 05/16/2009   Past Medical History:  Diagnosis Date   Constipation    Hyperlipidemia    Hypothyroid    IBS (irritable bowel syndrome)    Menopausal syndrome    Mitral prolapse and regurge used to have prophylaxis   Obesity    Past Surgical History:  Procedure Laterality Date   BREAST EXCISIONAL BIOPSY Right    BREAST SURGERY     breast biopsy x 2 benign   CESAREAN SECTION     retrovaginal fistula repair     Social History   Tobacco Use   Smoking status: Former   Smokeless tobacco: Never  Vaping Use   Vaping Use: Never used  Substance Use Topics   Alcohol use: No     Alcohol/week: 0.0 standard drinks   Drug use: No   Family History  Problem Relation Age of Onset   Alcohol abuse Mother    Diabetes Mother    Heart disease Mother        ? MI CAD   Cancer Father        lung CA   Alcohol abuse Father    Hypertension Sister    Hyperlipidemia  Sister    Alcohol abuse Brother    Colon polyps Brother    Obesity Maternal Grandfather    Breast cancer Neg Hx    Allergies  Allergen Reactions   Codeine     REACTION: Severe nausea and vomitnig   Current Outpatient Medications on File Prior to Visit  Medication Sig Dispense Refill   amLODipine (NORVASC) 5 MG tablet Take 1 tablet by mouth daily 90 tablet 0   gabapentin (NEURONTIN) 300 MG capsule Take 1 capsule (300 mg total) by mouth daily as needed (leg pain). 90 capsule 0   levothyroxine (SYNTHROID) 150 MCG tablet Take 1 tablet (150 mcg total) by mouth daily before breakfast. 90 tablet 0   metoprolol succinate (TOPROL-XL) 50 MG 24 hr tablet Take 1 tablet (50 mg total) by mouth daily. Take with or immediately following a meal. 90 tablet 3   rosuvastatin (CRESTOR) 10 MG tablet Take 1 tablet (10 mg total) by mouth daily. 90 tablet 3   No current facility-administered medications on file prior to visit.     Review of Systems  Constitutional:  Negative for activity change, appetite change, fatigue, fever and unexpected weight change.  HENT:  Negative for congestion, ear pain, rhinorrhea, sinus pressure and sore throat.   Eyes:  Negative for pain, redness and visual disturbance.  Respiratory:  Negative for cough, shortness of breath and wheezing.   Cardiovascular:  Negative for chest pain and palpitations.  Gastrointestinal:  Negative for abdominal pain, blood in stool, constipation and diarrhea.  Endocrine: Negative for polydipsia and polyuria.  Genitourinary:  Negative for dysuria, frequency and urgency.  Musculoskeletal:  Negative for arthralgias, back pain and myalgias.  Skin:  Negative for pallor and  rash.  Allergic/Immunologic: Negative for environmental allergies.  Neurological:  Negative for dizziness, syncope and headaches.  Hematological:  Negative for adenopathy. Does not bruise/bleed easily.  Psychiatric/Behavioral:  Negative for decreased concentration and dysphoric mood. The patient is not nervous/anxious.       Objective:   Physical Exam Constitutional:      General: She is not in acute distress.    Appearance: Normal appearance. She is well-developed. She is obese. She is not ill-appearing or diaphoretic.  HENT:     Head: Normocephalic and atraumatic.     Right Ear: Tympanic membrane, ear canal and external ear normal.     Left Ear: Tympanic membrane, ear canal and external ear normal.     Nose: Nose normal. No congestion.     Mouth/Throat:     Mouth: Mucous membranes are moist.     Pharynx: Oropharynx is clear. No posterior oropharyngeal erythema.  Eyes:     General: No scleral icterus.    Extraocular Movements: Extraocular movements intact.     Conjunctiva/sclera: Conjunctivae normal.     Pupils: Pupils are equal, round, and reactive to light.  Neck:     Thyroid: No thyromegaly.     Vascular: No carotid bruit or JVD.  Cardiovascular:     Rate and Rhythm: Normal rate and regular rhythm.     Pulses: Normal pulses.     Heart sounds: Normal heart sounds.    No gallop.  Pulmonary:     Effort: Pulmonary effort is normal. No respiratory distress.     Breath sounds: Normal breath sounds. No wheezing.     Comments: Good air exch Chest:     Chest wall: No tenderness.  Abdominal:     General: Bowel sounds are normal. There is no distension or  abdominal bruit.     Palpations: Abdomen is soft. There is no mass.     Tenderness: There is no abdominal tenderness.     Hernia: No hernia is present.  Genitourinary:    Comments: Breast exam: No mass, nodules, thickening, tenderness, bulging, retraction, inflamation, nipple discharge or skin changes noted.  No axillary or  clavicular LA.     Musculoskeletal:        General: No tenderness. Normal range of motion.     Cervical back: Normal range of motion and neck supple. No rigidity. No muscular tenderness.     Right lower leg: No edema.     Left lower leg: No edema.     Comments: No kyphosis   Lymphadenopathy:     Cervical: No cervical adenopathy.  Skin:    General: Skin is warm and dry.     Coloration: Skin is not pale.     Findings: No erythema or rash.     Comments: Solar lentigines diffusely   Neurological:     Mental Status: She is alert. Mental status is at baseline.     Cranial Nerves: No cranial nerve deficit.     Motor: No abnormal muscle tone.     Coordination: Coordination normal.     Gait: Gait normal.     Deep Tendon Reflexes: Reflexes are normal and symmetric. Reflexes normal.  Psychiatric:        Mood and Affect: Mood normal.        Cognition and Memory: Cognition and memory normal.          Assessment & Plan:   Problem List Items Addressed This Visit       Cardiovascular and Mediastinum   Essential hypertension    bp in fair control at this time  BP Readings from Last 1 Encounters:  03/17/21 130/70  No changes needed Most recent labs reviewed  Disc lifstyle change with low sodium diet and exercise  Plan to continue  Amlodipine 5 mg daily  Metoprolol xl 50 mg daily      Relevant Orders   CBC with Differential/Platelet (Completed)   Comprehensive metabolic panel (Completed)     Endocrine   Hypothyroidism    Hypothyroidism  Pt has no clinical changes No change in energy level/ hair or skin/ edema and no tremor Lab Results  Component Value Date   TSH 0.98 03/17/2021    Plan to continue levothyroxine 150 mcg daily      Relevant Orders   TSH (Completed)     Other   Colon cancer screening    Due for colonoscopy  Referral done and # given for pt to call and schedule       Relevant Orders   Ambulatory referral to Gastroenterology   Elevated glucose  level    Lab Results  Component Value Date   HGBA1C 5.5 03/17/2021  disc imp of low glycemic diet and wt loss to prevent DM2       Relevant Orders   Hemoglobin A1c (Completed)   Family history of colonic polyps    Ref done for colonoscopy  Pt will call to schedule  Per chart-overdue for 5 y recall      Relevant Orders   Ambulatory referral to Gastroenterology   Hyperlipidemia LDL goal <130    Disc goals for lipids and reasons to control them Rev last labs with pt Rev low sat fat diet in detail Labs ordered Plan to continue crestor 10 mg daily-will titrate if  not at goal      Relevant Orders   Lipid panel (Completed)   Medicare annual wellness visit, subsequent - Primary    Reviewed health habits including diet and exercise and skin cancer prevention Reviewed appropriate screening tests for age  Also reviewed health mt list, fam hx and immunization status , as well as social and family history   See HPI Labs ordered Colonoscopy ordered  Mammogram utd  Plans to check cost of shingrix  dexa nl in 2017 No falls or fx, counseled on ca and D Given materials to work on advance directive No cognitive concerns Encouraged regular exercise  Reviewed hearing screen Eye and vision exams are utd PHQ score is 0 No help needed with ADLs, good functionality      Obesity    Discussed how this problem influences overall health and the risks it imposes  Reviewed plan for weight loss with lower calorie diet (via better food choices and also portion control or program like weight watchers) and exercise building up to or more than 30 minutes 5 days per week including some aerobic activity   [facility]      Routine general medical examination at a health care facility    Reviewed health habits including diet and exercise and skin cancer prevention Reviewed appropriate screening tests for age  Also reviewed health mt list, fam hx and  immunization status , as well as social and family history   See HPI Labs ordered Colonoscopy ordered  Mammogram utd  Plans to check cost of shingrix  dexa nl in 2017 No falls or fx, counseled on ca and D Given materials to work on advance directive No cognitive concerns Encouraged regular exercise  Reviewed hearing screen Eye and vision exams are utd PHQ score is 0 No help needed with ADLs, good functionality

## 2021-03-17 NOTE — Patient Instructions (Addendum)
[facility]  ? ?If you are interested in the new shingles vaccine (Shingrix) - call your local pharmacy to check on coverage and availability  ?If affordable, get on a wait list at your pharmacy to get the vaccine. ? ?Since you get calcim from tums get some vitamin D ?Vitamin D3   2000 iu daily  ? ? ?Do the blue book for advance directive and get it notarized  ? ?Please call to schedule a colonscopy ? ?Due to there being a large influx of referrals the GI offices they have asked that you call their office regarding your referral and scheduling needs. Otherwise, they will reach out to you as soon as they are able. They are working diligently to get patients called and scheduled as soon as possible. ? ?Colonoscopies will be called according to date/time of the referral entry as these are not of urgent need.   ? ?[facility]  [phone] ?[facility]  [phone] ?[facility]  [phone] ? ?[facility] can be considered as well. They may be able to book sooner appointments. You can reach [facility] at [phone].  ? ? ?

## 2021-03-18 LAB — LIPID PANEL
Cholesterol: 163 mg/dL (ref <200)
HDL: 57 mg/dL (ref >=50)
LDL Cholesterol (Calc): 79 mg/dL (calc)
Non-HDL Cholesterol (Calc): 106 mg/dL (calc) (ref <130)
Total CHOL/HDL Ratio: 2.9 (calc) (ref <5.0)
Triglycerides: 174 mg/dL — ABNORMAL HIGH (ref <150)

## 2021-03-18 LAB — COMPREHENSIVE METABOLIC PANEL
AG Ratio: 2 (calc) (ref 1.0–2.5)
ALT: 19 U/L (ref 6–29)
AST: 18 U/L (ref 10–35)
Albumin: 4.9 g/dL (ref 3.6–5.1)
Alkaline phosphatase (APISO): 63 U/L (ref 37–153)
BUN/Creatinine Ratio: 34 (calc) — ABNORMAL HIGH (ref 6–22)
BUN: 19 mg/dL (ref 7–25)
CO2: 24 mmol/L (ref 20–32)
Calcium: 9.9 mg/dL (ref 8.6–10.4)
Chloride: 99 mmol/L (ref 98–110)
Creat: 0.56 mg/dL — ABNORMAL LOW (ref 0.60–1.00)
Globulin: 2.5 g/dL (calc) (ref 1.9–3.7)
Glucose, Bld: 110 mg/dL — ABNORMAL HIGH (ref 65–99)
Potassium: 4.2 mmol/L (ref 3.5–5.3)
Sodium: 137 mmol/L (ref 135–146)
Total Bilirubin: 0.6 mg/dL (ref 0.2–1.2)
Total Protein: 7.4 g/dL (ref 6.1–8.1)

## 2021-03-18 LAB — CBC WITH DIFFERENTIAL/PLATELET
Absolute Monocytes: 736 cells/uL (ref 200–950)
Basophils Absolute: 64 cells/uL (ref 0–200)
Basophils Relative: 0.7 %
Eosinophils Absolute: 110 cells/uL (ref 15–500)
Eosinophils Relative: 1.2 %
HCT: 45.1 % — ABNORMAL HIGH (ref 35.0–45.0)
Hemoglobin: 14.8 g/dL (ref 11.7–15.5)
Lymphs Abs: 1932 cells/uL (ref 850–3900)
MCH: 27.9 pg (ref 27.0–33.0)
MCHC: 32.8 g/dL (ref 32.0–36.0)
MCV: 84.9 fL (ref 80.0–100.0)
MPV: 10.1 fL (ref 7.5–12.5)
Monocytes Relative: 8 %
Neutro Abs: 6357 cells/uL (ref 1500–7800)
Neutrophils Relative %: 69.1 %
Platelets: 345 10*3/uL (ref 140–400)
RBC: 5.31 10*6/uL — ABNORMAL HIGH (ref 3.80–5.10)
RDW: 13.1 % (ref 11.0–15.0)
Total Lymphocyte: 21 %
WBC: 9.2 10*3/uL (ref 3.8–10.8)

## 2021-03-18 LAB — TSH: TSH: 0.98 mIU/L (ref 0.40–4.50)

## 2021-03-18 LAB — HEMOGLOBIN A1C
Hgb A1c MFr Bld: 5.5 % of total Hgb (ref <5.7)
Mean Plasma Glucose: 111 mg/dL
eAG (mmol/L): 6.2 mmol/L

## 2021-03-19 ENCOUNTER — Encounter: Payer: Self-pay | Admitting: Family Medicine

## 2021-03-19 DIAGNOSIS — Z0000 Encounter for general adult medical examination without abnormal findings: Secondary | ICD-10-CM | POA: Insufficient documentation

## 2021-03-19 NOTE — Assessment & Plan Note (Signed)
Due for colonoscopy  ?Referral done and # given for pt to call and schedule  ?

## 2021-03-19 NOTE — Assessment & Plan Note (Signed)
Lab Results  ?Component Value Date  ? HGBA1C 5.5 03/17/2021  ? ?disc imp of low glycemic diet and wt loss to prevent DM2  ?

## 2021-03-19 NOTE — Assessment & Plan Note (Signed)
Ref done for colonoscopy  ?Pt will call to schedule  ?Per chart-overdue for 5 y recall ?

## 2021-03-19 NOTE — Assessment & Plan Note (Signed)
Reviewed health habits including diet and exercise and skin cancer prevention ?Reviewed appropriate screening tests for age  ?Also reviewed health mt list, fam hx and immunization status , as well as social and family history   ?See HPI ?Labs ordered ?Colonoscopy ordered  ?Mammogram utd  ?Plans to check cost of shingrix  ?dexa nl in 2017 ?No falls or fx, counseled on ca and D ?Given materials to work on advance directive ?No cognitive concerns ?Encouraged regular exercise  ?Reviewed hearing screen ?Eye and vision exams are utd ?PHQ score is 0 ?No help needed with ADLs, good functionality ?

## 2021-03-19 NOTE — Assessment & Plan Note (Signed)
Discussed how this problem influences overall health and the risks it imposes  ?Reviewed plan for weight loss with lower calorie diet (via better food choices and also portion control or program like weight watchers) and exercise building up to or more than 30 minutes 5 days per week including some aerobic activity  ? ?[facility] ?

## 2021-03-19 NOTE — Assessment & Plan Note (Signed)
Hypothyroidism  ?Pt has no clinical changes ?No change in energy level/ hair or skin/ edema and no tremor ?Lab Results  ?Component Value Date  ? TSH 0.98 03/17/2021  ?  ?Plan to continue levothyroxine 150 mcg daily ?

## 2021-03-19 NOTE — Assessment & Plan Note (Signed)
bp in fair control at this time  ?BP Readings from Last 1 Encounters:  ?03/17/21 130/70  ? ?No changes needed ?Most recent labs reviewed  ?Disc lifstyle change with low sodium diet and exercise  ?Plan to continue  ?Amlodipine 5 mg daily  ?Metoprolol xl 50 mg daily ?

## 2021-03-19 NOTE — Assessment & Plan Note (Signed)
Disc goals for lipids and reasons to control them ?Rev last labs with pt ?Rev low sat fat diet in detail ?Labs ordered ?Plan to continue crestor 10 mg daily-will titrate if not at goal ?

## 2021-03-21 ENCOUNTER — Telehealth: Payer: Self-pay

## 2021-03-21 ENCOUNTER — Other Ambulatory Visit: Payer: Self-pay

## 2021-03-21 DIAGNOSIS — Z1211 Encounter for screening for malignant neoplasm of colon: Secondary | ICD-10-CM

## 2021-03-21 MED ORDER — NA SULFATE-K SULFATE-MG SULF 17.5-3.13-1.6 GM/177ML PO SOLN: 1.0000 | Freq: Once | ORAL | 0 refills | Status: AC

## 2021-03-21 NOTE — Telephone Encounter (Signed)
CALLED PATIENT NO ANSWER LEFT VOICEMAIL FOR A CALL BACK ? ?

## 2021-03-21 NOTE — Telephone Encounter (Signed)
Pt returned call will call back after 2:00 ?

## 2021-03-21 NOTE — Progress Notes (Signed)
Gastroenterology Pre-Procedure Review ? ?Request Date: 05/16/2021 ?Requesting Physician: Dr. Allen Norris ? ?PATIENT REVIEW QUESTIONS: The patient responded to the following health history questions as indicated:   ? ?1. Are you having any GI issues? no ?2. Do you have a personal history of Polyps? no ?3. Do you have a family history of Colon Cancer or Polyps? yes (polyps) ?4. Diabetes Mellitus? no ?5. Joint replacements in the past 12 months?no ?6. Major health problems in the past 3 months?no ?7. Any artificial heart valves, MVP, or defibrillator?no ?   ?MEDICATIONS & ALLERGIES:    ?Patient reports the following regarding taking any anticoagulation/antiplatelet therapy:   ?Plavix, Coumadin, Eliquis, Xarelto, Lovenox, Pradaxa, Brilinta, or Effient? no ?Aspirin? yes (81 mg asprin) ? ?Patient confirms/reports the following medications:  ?Current Outpatient Medications  ?Medication Sig Dispense Refill  ? amLODipine (NORVASC) 5 MG tablet Take 1 tablet by mouth daily 90 tablet 0  ? gabapentin (NEURONTIN) 300 MG capsule Take 1 capsule (300 mg total) by mouth daily as needed (leg pain). 90 capsule 0  ? levothyroxine (SYNTHROID) 150 MCG tablet Take 1 tablet (150 mcg total) by mouth daily before breakfast. 90 tablet 0  ? metoprolol succinate (TOPROL-XL) 50 MG 24 hr tablet Take 1 tablet (50 mg total) by mouth daily. Take with or immediately following a meal. 90 tablet 3  ? rosuvastatin (CRESTOR) 10 MG tablet Take 1 tablet (10 mg total) by mouth daily. 90 tablet 3  ? ?No current facility-administered medications for this visit.  ? ? ?Patient confirms/reports the following allergies:  ?Allergies  ?Allergen Reactions  ? Codeine   ?  REACTION: Severe nausea and vomitnig  ? ? ?No orders of the defined types were placed in this encounter. ? ? ?AUTHORIZATION INFORMATION ?Primary Insurance: ?1D#: ?Group #: ? ?Secondary Insurance: ?1D#: ?Group #: ? ?SCHEDULE INFORMATION: ?Date:05/16/2021  ?Time: ?Location: ?[facility] ?

## 2021-03-26 IMAGING — MG DIGITAL SCREENING BILATERAL MAMMOGRAM WITH TOMO AND CAD
8 series · 8 of 24 positions shown · non-contrast
Comparison: Previous exam(s).

CLINICAL DATA: Screening.

EXAM:
DIGITAL SCREENING BILATERAL MAMMOGRAM WITH TOMO AND CAD

[L CC synth-2D]
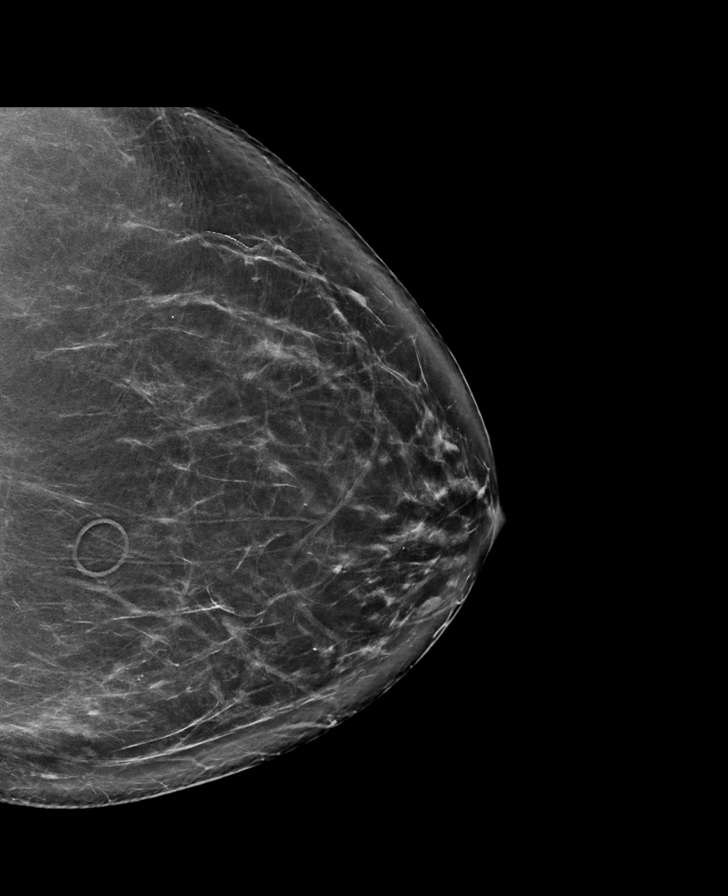

[L MLO synth-2D]
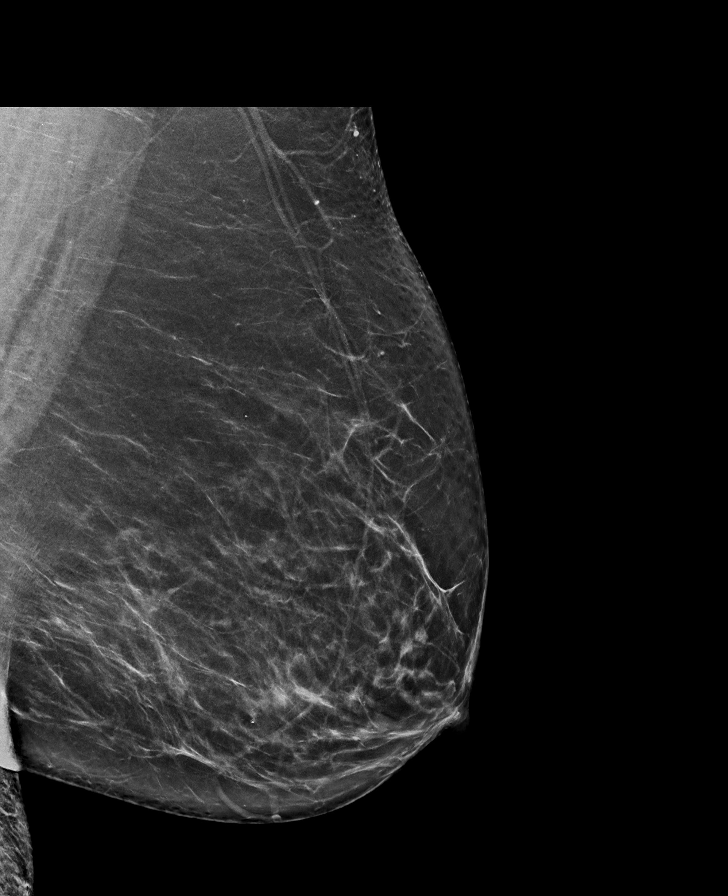

[R CC synth-2D]
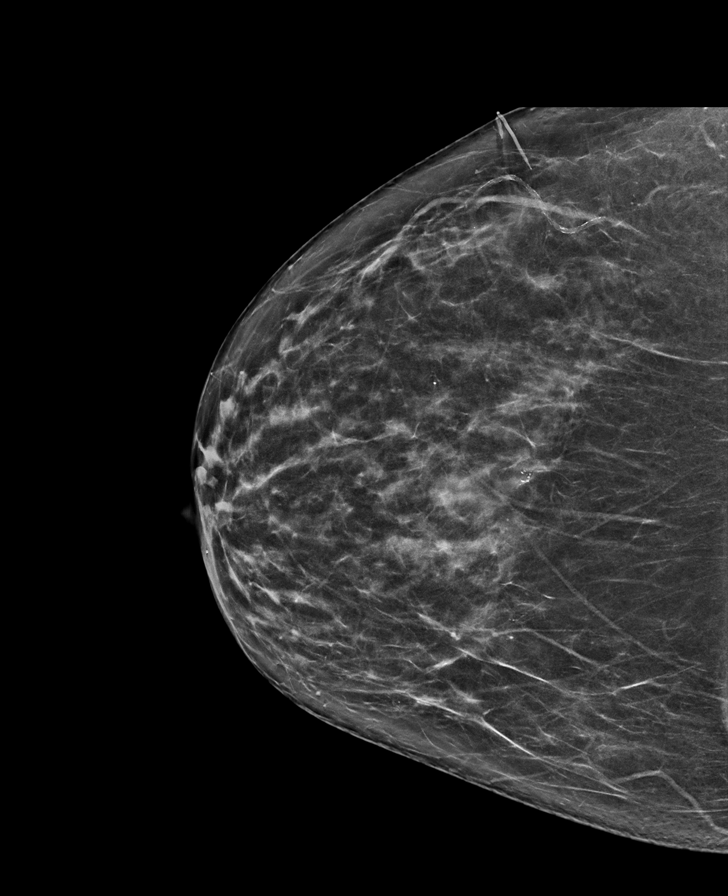

[R MLO synth-2D]
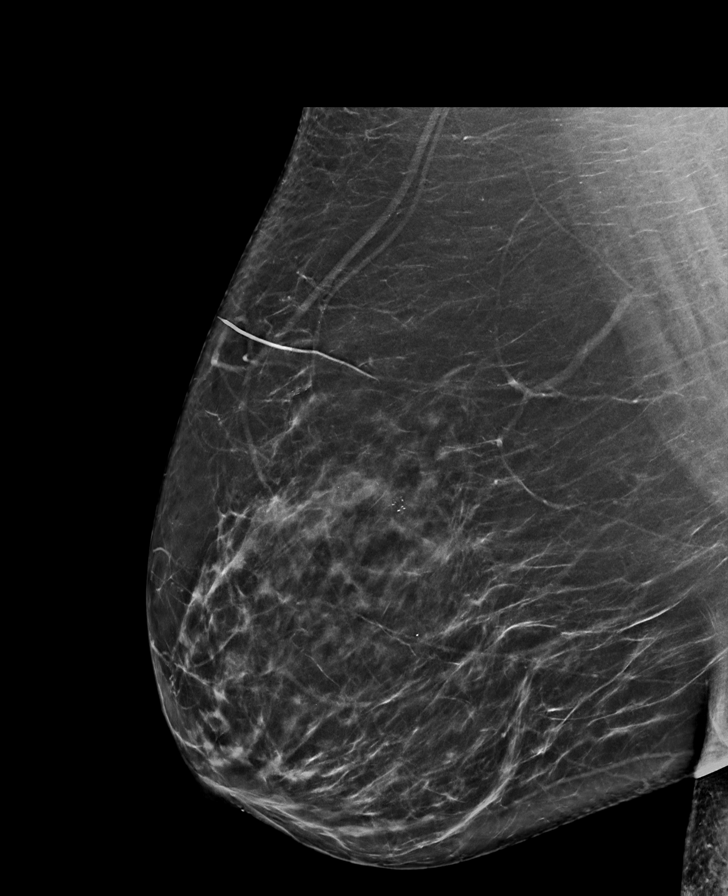

[R MLO tomo · tomo slice 45/89.0]
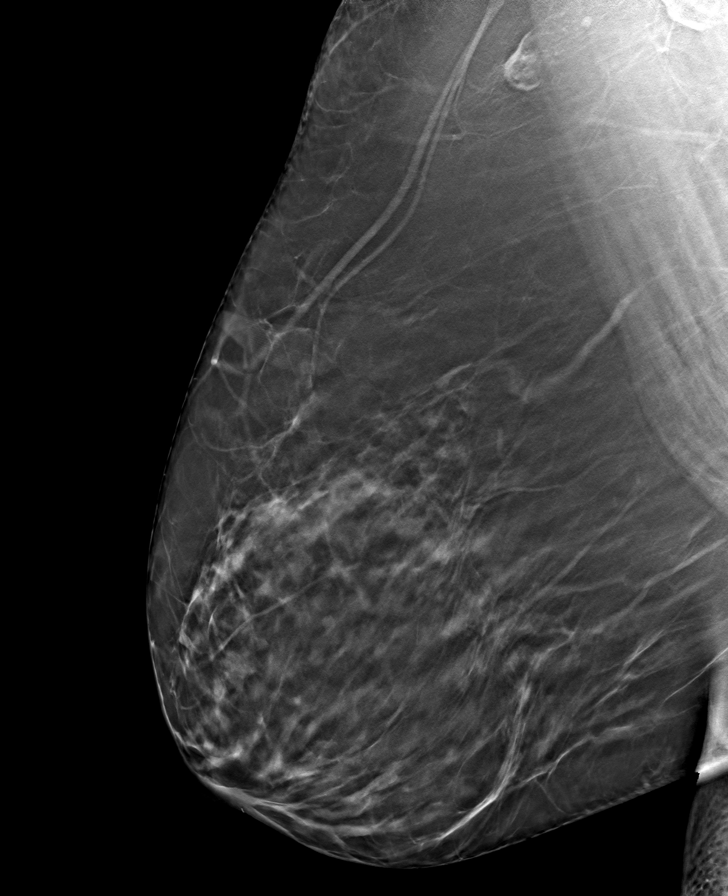

[L MLO tomo · tomo slice 44/87.0]
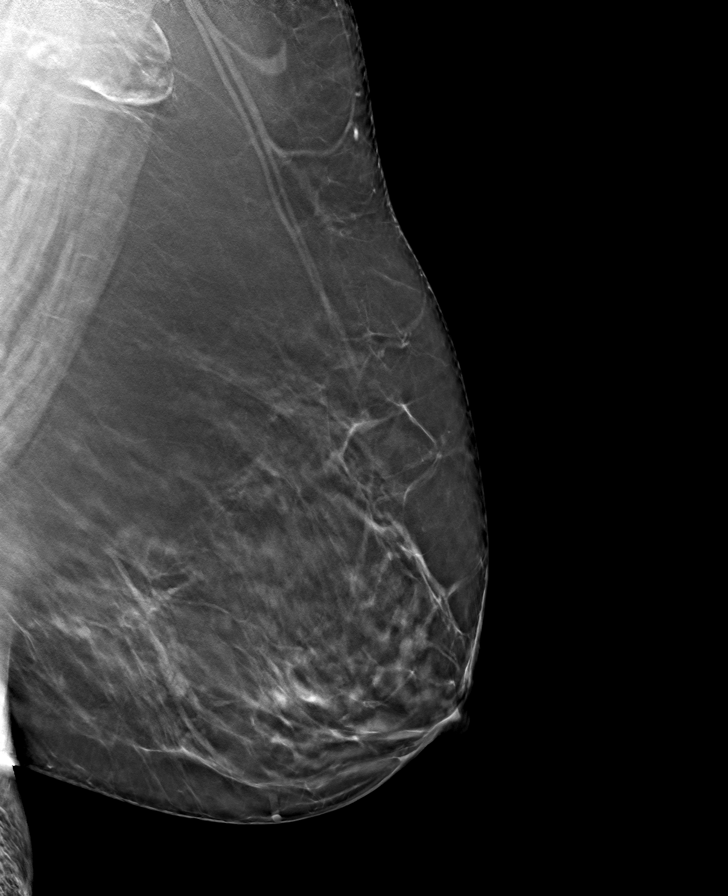

[L CC tomo · tomo slice 41/81.0]
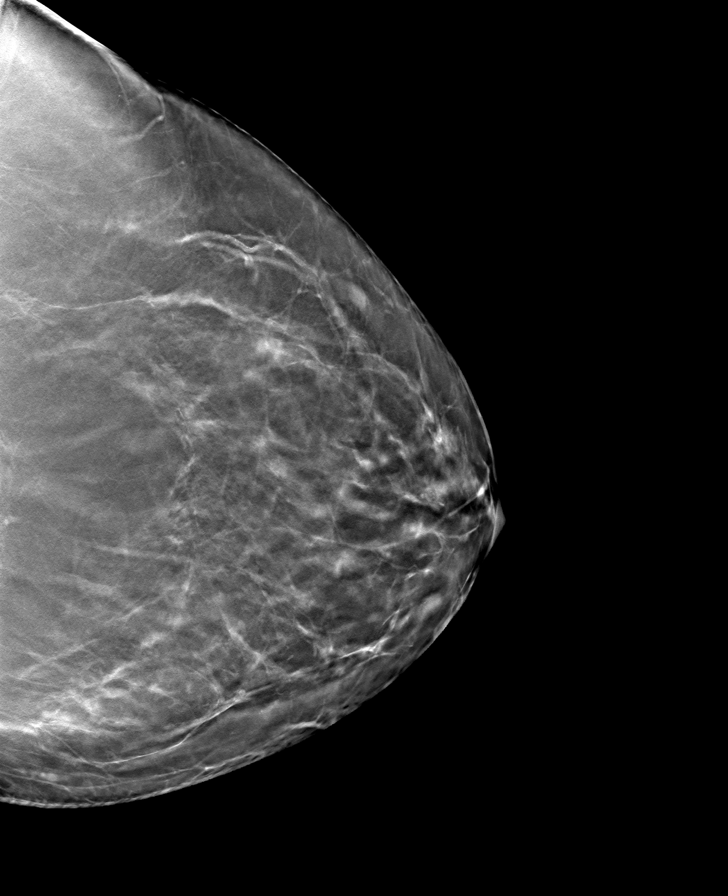

[R CC tomo · tomo slice 38/75.0]
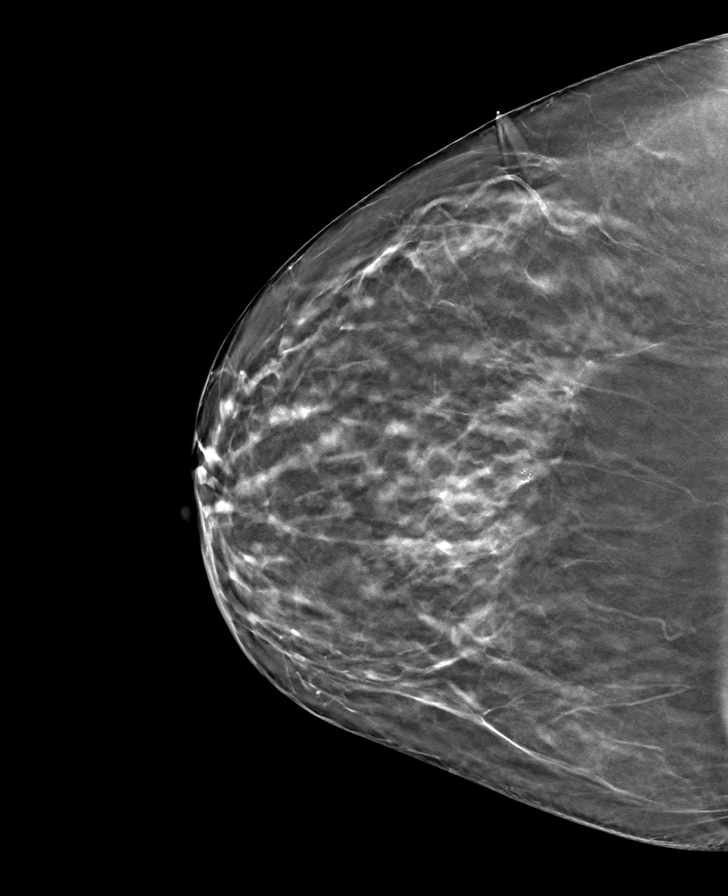

[8 of 24 positions shown; findings below may reference images not displayed]

ACR Breast Density Category b: There are scattered areas of
fibroglandular density.
FINDINGS: There are no findings suspicious for malignancy. Images were
processed with CAD.
IMPRESSION: No mammographic evidence of malignancy. A result letter of this
screening mammogram will be mailed directly to the patient.

RECOMMENDATION:
Screening mammogram in one year. (Code:[id number])

BI-RADS CATEGORY  1: Negative.

## 2021-03-27 ENCOUNTER — Encounter: Payer: Self-pay | Admitting: Family Medicine

## 2021-03-27 NOTE — Telephone Encounter (Signed)
Updated in the chart

## 2021-04-18 DIAGNOSIS — H25012 Cortical age-related cataract, left eye: Secondary | ICD-10-CM | POA: Diagnosis not present

## 2021-04-18 DIAGNOSIS — H2511 Age-related nuclear cataract, right eye: Secondary | ICD-10-CM | POA: Diagnosis not present

## 2021-04-25 DIAGNOSIS — H2512 Age-related nuclear cataract, left eye: Secondary | ICD-10-CM | POA: Diagnosis not present

## 2021-04-27 ENCOUNTER — Other Ambulatory Visit: Payer: Self-pay | Admitting: *Deleted

## 2021-04-27 MED ORDER — AMLODIPINE BESYLATE 5 MG PO TABS: 5.0000 mg | ORAL_TABLET | Freq: Every day | ORAL | 2 refills | Status: DC

## 2021-04-27 MED ORDER — LEVOTHYROXINE SODIUM 150 MCG PO TABS: 150.0000 ug | ORAL_TABLET | Freq: Every day | ORAL | 2 refills | Status: DC

## 2021-05-03 ENCOUNTER — Telehealth: Payer: Self-pay | Admitting: Gastroenterology

## 2021-05-03 ENCOUNTER — Telehealth: Payer: Self-pay

## 2021-05-03 NOTE — Telephone Encounter (Signed)
Pt is requesting another set of paperwork to be mailed to her for coming up colonoscopy ?

## 2021-05-03 NOTE — Telephone Encounter (Signed)
Sent new paperwork to patient as requested ?

## 2021-05-09 NOTE — Telephone Encounter (Signed)
error 

## 2021-05-16 ENCOUNTER — Encounter: Payer: Self-pay | Admitting: Gastroenterology

## 2021-05-16 ENCOUNTER — Encounter
Admission: RE | Disposition: A | Discharge status: Home / Self Care | Payer: Self-pay | Source: Home / Self Care | Attending: Gastroenterology

## 2021-05-16 ENCOUNTER — Ambulatory Visit: Payer: PPO | Admitting: Certified Registered"

## 2021-05-16 ENCOUNTER — Ambulatory Visit
Admission: RE | Admit: 2021-05-16 | Discharge: 2021-05-16 | Disposition: A | Discharge status: Home / Self Care | Payer: PPO | Attending: Gastroenterology | Admitting: Gastroenterology

## 2021-05-16 DIAGNOSIS — Z79899 Other long term (current) drug therapy: Secondary | ICD-10-CM | POA: Insufficient documentation

## 2021-05-16 DIAGNOSIS — I2510 Atherosclerotic heart disease of native coronary artery without angina pectoris: Secondary | ICD-10-CM | POA: Insufficient documentation

## 2021-05-16 DIAGNOSIS — Z1211 Encounter for screening for malignant neoplasm of colon: Secondary | ICD-10-CM | POA: Insufficient documentation

## 2021-05-16 DIAGNOSIS — E785 Hyperlipidemia, unspecified: Secondary | ICD-10-CM | POA: Diagnosis not present

## 2021-05-16 DIAGNOSIS — E669 Obesity, unspecified: Secondary | ICD-10-CM | POA: Diagnosis not present

## 2021-05-16 DIAGNOSIS — K219 Gastro-esophageal reflux disease without esophagitis: Secondary | ICD-10-CM | POA: Insufficient documentation

## 2021-05-16 DIAGNOSIS — Z87891 Personal history of nicotine dependence: Secondary | ICD-10-CM | POA: Insufficient documentation

## 2021-05-16 DIAGNOSIS — Z79890 Hormone replacement therapy (postmenopausal): Secondary | ICD-10-CM | POA: Diagnosis not present

## 2021-05-16 DIAGNOSIS — I10 Essential (primary) hypertension: Secondary | ICD-10-CM | POA: Insufficient documentation

## 2021-05-16 DIAGNOSIS — K640 First degree hemorrhoids: Secondary | ICD-10-CM | POA: Insufficient documentation

## 2021-05-16 DIAGNOSIS — K589 Irritable bowel syndrome without diarrhea: Secondary | ICD-10-CM | POA: Insufficient documentation

## 2021-05-16 DIAGNOSIS — E039 Hypothyroidism, unspecified: Secondary | ICD-10-CM | POA: Diagnosis not present

## 2021-05-16 DIAGNOSIS — Z6831 Body mass index (BMI) 31.0-31.9, adult: Secondary | ICD-10-CM | POA: Insufficient documentation

## 2021-05-16 HISTORY — PX: COLONOSCOPY WITH PROPOFOL: SHX5780

## 2021-05-16 HISTORY — DX: Atherosclerotic heart disease of native coronary artery without angina pectoris: I25.10

## 2021-05-16 SURGERY — COLONOSCOPY WITH PROPOFOL
Anesthesia: General

## 2021-05-16 MED ORDER — SODIUM CHLORIDE 0.9 % IV SOLN
INTRAVENOUS | Status: DC
  Administered 2021-05-16: 1000 mL via INTRAVENOUS

## 2021-05-16 MED ORDER — LIDOCAINE HCL (CARDIAC) PF 100 MG/5ML IV SOSY
PREFILLED_SYRINGE | INTRAVENOUS | Status: DC | PRN
  Administered 2021-05-16: 100 mg via INTRAVENOUS

## 2021-05-16 MED ORDER — PROPOFOL 10 MG/ML IV BOLUS
INTRAVENOUS | Status: DC | PRN
  Administered 2021-05-16: 30 mg via INTRAVENOUS
  Administered 2021-05-16: 100 mg via INTRAVENOUS

## 2021-05-16 MED ORDER — PROPOFOL 500 MG/50ML IV EMUL
INTRAVENOUS | Status: DC | PRN
  Administered 2021-05-16: 200 ug/kg/min via INTRAVENOUS

## 2021-05-16 NOTE — Transfer of Care (Addendum)
Immediate Anesthesia Transfer of Care Note ? ?Patient: Robin Gilmore ? ?Procedure(s) Performed: COLONOSCOPY WITH PROPOFOL ? ?Patient Location: PACU and Endoscopy Unit ? ?Anesthesia Type:General ? ?Level of Consciousness: awake ? ?Airway & Oxygen Therapy: Patient Spontanous Breathing ? ?Post-op Assessment: Report given to RN ? ?Post vital signs: stable ? ?Last Vitals:  ?Vitals Value Taken Time  ?BP    ?Temp    ?Pulse    ?Resp    ?SpO2    ? ? ?Last Pain: There were no vitals filed for this visit.   ? ?  ? ?Complications: No notable events documented. ?

## 2021-05-16 NOTE — Anesthesia Postprocedure Evaluation (Signed)
Anesthesia Post Note ? ?Patient: Robin Gilmore ? ?Procedure(s) Performed: COLONOSCOPY WITH PROPOFOL ? ?Patient location during evaluation: Endoscopy ?Anesthesia Type: General ?Level of consciousness: awake and alert ?Pain management: pain level controlled ?Vital Signs Assessment: post-procedure vital signs reviewed and stable ?Respiratory status: spontaneous breathing, nonlabored ventilation, respiratory function stable and patient connected to nasal cannula oxygen ?Cardiovascular status: blood pressure returned to baseline and stable ?Postop Assessment: no apparent nausea or vomiting ?Anesthetic complications: no ? ? ?No notable events documented. ? ? ?Last Vitals:  ?Vitals:  ? 05/16/21 0915 05/16/21 0925  ?BP: 115/64 134/65  ?Pulse: 63 60  ?Resp: 20 (!) 22  ?Temp:    ?SpO2: 99% 100%  ?  ?Last Pain:  ?Vitals:  ? 05/16/21 0925  ?TempSrc:   ?PainSc: 0-No pain  ? ? ?  ?  ?  ?  ?  ?  ? ?[name] ? ? ? ? ?

## 2021-05-16 NOTE — Anesthesia Preprocedure Evaluation (Signed)
Anesthesia Evaluation  ?Patient identified by MRN, date of birth, ID band ?Patient awake ? ? ? ?Reviewed: ?Allergy & Precautions, NPO status , Patient's Chart, lab work & pertinent test results ? ?History of Anesthesia Complications ?Negative for: history of anesthetic complications ? ?Airway ?Mallampati: III ? ?TM Distance: <3 FB ?Neck ROM: full ? ? ? Dental ? ?(+) Chipped ?  ?Pulmonary ?neg shortness of breath, former smoker,  ?  ?Pulmonary exam normal ? ? ? ? ? ? ? Cardiovascular ?Exercise Tolerance: Good ?hypertension, (-) angina+ CAD  ?(-) DOE Normal cardiovascular exam+ Valvular Problems/Murmurs  ? ? ?  ?Neuro/Psych ?negative neurological ROS ? negative psych ROS  ? GI/Hepatic ?negative GI ROS, Neg liver ROS, neg GERD  ,  ?Endo/Other  ?Hypothyroidism  ? Renal/GU ?negative Renal ROS  ?negative genitourinary ?  ?Musculoskeletal ? ? Abdominal ?  ?Peds ? Hematology ?negative hematology ROS ?(+)   ?Anesthesia Other Findings ?Past Medical History: ?No date: Constipation ?No date: Coronary artery disease ?No date: Hyperlipidemia ?No date: Hypothyroid ?No date: IBS (irritable bowel syndrome) ?No date: Menopausal syndrome ?and regurge used to have prophylaxis: Mitral prolapse ?No date: Obesity ? ?Past Surgical History: ?No date: BREAST EXCISIONAL BIOPSY; Right ?No date: BREAST SURGERY ?    Comment:  breast biopsy x 2 benign ?No date: CESAREAN SECTION ?No date: retrovaginal fistula repair ? ?BMI   ? Body Mass Index: 31.08 kg/m?  ?  ? ? Reproductive/Obstetrics ?negative OB ROS ? ?  ? ? ? ? ? ? ? ? ? ? ? ? ? ?  ?  ? ? ? ? ? ? ? ? ?Anesthesia Physical ?Anesthesia Plan ? ?ASA: 3 ? ?Anesthesia Plan: General  ? ?Post-op Pain Management:   ? ?Induction: Intravenous ? ?PONV Risk Score and Plan: Propofol infusion and TIVA ? ?Airway Management Planned: Natural Airway and Nasal Cannula ? ?Additional Equipment:  ? ?Intra-op Plan:  ? ?Post-operative Plan:  ? ?Informed Consent: I have reviewed the  patients History and Physical, chart, labs and discussed the procedure including the risks, benefits and alternatives for the proposed anesthesia with the patient or authorized representative who has indicated his/her understanding and acceptance.  ? ? ? ?Dental Advisory Given ? ?Plan Discussed with: Anesthesiologist, CRNA and Surgeon ? ?Anesthesia Plan Comments: (Patient consented for risks of anesthesia including but not limited to:  ?- adverse reactions to medications ?- risk of airway placement if required ?- damage to eyes, teeth, lips or other oral mucosa ?- nerve damage due to positioning  ?- sore throat or hoarseness ?- Damage to heart, brain, nerves, lungs, other parts of body or loss of life ? ?Patient voiced understanding.)  ? ? ? ? ? ? ?Anesthesia Quick Evaluation ? ?

## 2021-05-16 NOTE — H&P (Signed)
? ?Robin Lame, MD [facility] ?[place], [place] [address] ?Phone: [phone] ?Fax : [phone] ? ?Primary Care Physician:  Tower, Wynelle Fanny, MD ?Primary Gastroenterologist:  Dr. Allen Norris ? ?Pre-Procedure History & Physical: ?HPI:  Robin Gilmore is a 71 y.o. female is here for a screening colonoscopy.  ? ?Past Medical History:  ?Diagnosis Date  ? Constipation   ? Coronary artery disease   ? Hyperlipidemia   ? Hypothyroid   ? IBS (irritable bowel syndrome)   ? Menopausal syndrome   ? Mitral prolapse and regurge used to have prophylaxis  ? Obesity   ? ? ?Past Surgical History:  ?Procedure Laterality Date  ? BREAST EXCISIONAL BIOPSY Right   ? BREAST SURGERY    ? breast biopsy x 2 benign  ? CESAREAN SECTION    ? retrovaginal fistula repair    ? ? ?Prior to Admission medications   ?Medication Sig Start Date End Date Taking? Authorizing Provider  ?amLODipine (NORVASC) 5 MG tablet Take 1 tablet (5 mg total) by mouth daily. 04/27/21  Yes Tower, Wynelle Fanny, MD  ?gabapentin (NEURONTIN) 300 MG capsule Take 1 capsule (300 mg total) by mouth daily as needed (leg pain). 02/15/21  Yes Tower, Wynelle Fanny, MD  ?levothyroxine (SYNTHROID) 150 MCG tablet Take 1 tablet (150 mcg total) by mouth daily before breakfast. 04/27/21  Yes Tower, Wynelle Fanny, MD  ?metoprolol succinate (TOPROL-XL) 50 MG 24 hr tablet Take 1 tablet (50 mg total) by mouth daily. Take with or immediately following a meal. 02/03/21  Yes Jerline Pain, MD  ?rosuvastatin (CRESTOR) 10 MG tablet Take 1 tablet (10 mg total) by mouth daily. 02/07/21  Yes Jerline Pain, MD  ? ? ?Allergies as of 03/21/2021 - Review Complete 03/21/2021  ?Allergen Reaction Noted  ? Codeine    ? ? ?Family History  ?Problem Relation Age of Onset  ? Alcohol abuse Mother   ? Diabetes Mother   ? Heart disease Mother   ?     ? MI CAD  ? Cancer Father   ?     lung CA  ? Alcohol abuse Father   ? Hypertension Sister   ? Hyperlipidemia Sister   ? Alcohol abuse Brother   ? Colon polyps Brother   ?  Obesity Maternal Grandfather   ? Breast cancer Neg Hx   ? ? ?Social History  ? ?Socioeconomic History  ? Marital status: Married  ?  Spouse name: Not on file  ? Number of children: Not on file  ? Years of education: Not on file  ? Highest education level: Not on file  ?Occupational History  ? Not on file  ?Tobacco Use  ? Smoking status: Former  ? Smokeless tobacco: Never  ?Vaping Use  ? Vaping Use: Never used  ?Substance and Sexual Activity  ? Alcohol use: No  ?  Alcohol/week: 0.0 standard drinks  ? Drug use: No  ? Sexual activity: Never  ?Other Topics Concern  ? Not on file  ?Social History Narrative  ? Not on file  ? ?Social Determinants of Health  ? ?Financial Resource Strain: Not on file  ?Food Insecurity: Not on file  ?Transportation Needs: Not on file  ?Physical Activity: Not on file  ?Stress: Not on file  ?Social Connections: Not on file  ?Intimate Partner Violence: Not on file  ? ? ?Review of Systems: ?See HPI, otherwise negative ROS ? ?Physical Exam: ?BP (!) 161/89   Pulse 68   Temp (!)  96.5 ?F (35.8 ?C) (Temporal)   Resp 17   Ht '5\' 7"'$  (1.702 m)   Wt 90 kg   SpO2 95%   BMI 31.08 kg/m?  ?General:   Alert,  pleasant and cooperative in NAD ?Head:  Normocephalic and atraumatic. ?Neck:  Supple; no masses or thyromegaly. ?Lungs:  Clear throughout to auscultation.    ?Heart:  Regular rate and rhythm. ?Abdomen:  Soft, nontender and nondistended. Normal bowel sounds, without guarding, and without rebound.   ?Neurologic:  Alert and  oriented x4;  grossly normal neurologically. ? ?Impression/Plan: ?[name] is now here to undergo a screening colonoscopy. ? ?Risks, benefits, and alternatives regarding colonoscopy have been reviewed with the patient.  Questions have been answered.  All parties agreeable. ?

## 2021-05-16 NOTE — Op Note (Signed)
[facility] ?Gastroenterology ?Patient Name: Xiana Carns ?Procedure Date: 05/16/2021 8:43 AM ?MRN: [id number] ?Account #: [id number] ?Date of Birth: 1950/05/12 ?Admit Type: Outpatient ?Age: 71 ?Room: [facility] ENDO ROOM 4 ?Gender: Female ?Note Status: Finalized ?Instrument Name: Colonoscope [id number] ?Procedure:             Colonoscopy ?Indications:           Screening for colorectal malignant neoplasm ?Providers:             Lucilla Lame MD, MD ?Referring MD:          Wynelle Fanny. Tower (Referring MD) ?Medicines:             Propofol per Anesthesia ?Complications:         No immediate complications. ?Procedure:             Pre-Anesthesia Assessment: ?                       - Prior to the procedure, a History and Physical was  ?                       performed, and patient medications and allergies were  ?                       reviewed. The patient's tolerance of previous  ?                       anesthesia was also reviewed. The risks and benefits  ?                       of the procedure and the sedation options and risks  ?                       were discussed with the patient. All questions were  ?                       answered, and informed consent was obtained. Prior  ?                       Anticoagulants: The patient has taken no previous  ?                       anticoagulant or antiplatelet agents. ASA Grade  ?                       Assessment: II - A patient with mild systemic disease.  ?                       After reviewing the risks and benefits, the patient  ?                       was deemed in satisfactory condition to undergo the  ?                       procedure. ?                       After obtaining informed consent, the colonoscope was  ?  passed under direct vision. Throughout the procedure,  ?                       the patient's blood pressure, pulse, and oxygen  ?                       saturations were monitored continuously. The  ?                        Colonoscope was introduced through the anus and  ?                       advanced to the the cecum, identified by appendiceal  ?                       orifice and ileocecal valve. The colonoscopy was  ?                       performed without difficulty. The patient tolerated  ?                       the procedure well. The quality of the bowel  ?                       preparation was excellent. ?Findings: ?     The perianal and digital rectal examinations were normal. ?     Non-bleeding internal hemorrhoids were found during retroflexion. The  ?     hemorrhoids were Grade I (internal hemorrhoids that do not prolapse). ?Impression:            - Non-bleeding internal hemorrhoids. ?                       - No specimens collected. ?Recommendation:        - Discharge patient to home. ?                       - Resume previous diet. ?                       - Continue present medications. ?                       - Repeat colonoscopy is not recommended for screening  ?                       purposes. ?Procedure Code(s):     --- Professional --- ?                       [phone], Colonoscopy, flexible; diagnostic, including  ?                       collection of specimen(s) by brushing or washing, when  ?                       performed (separate procedure) ?Diagnosis Code(s):     --- Professional --- ?                       Z12.11, Encounter for screening for malignant neoplasm  ?  of colon ?CPT copyright 2019 [employer]. All rights reserved. ?The codes documented in this report are preliminary and upon coder review may  ?be revised to meet current compliance requirements. ?Lucilla Lame MD, MD ?05/16/2021 9:01:50 AM ?This report has been signed electronically. ?Number of Addenda: 0 ?Note Initiated On: 05/16/2021 8:43 AM ?Scope Withdrawal Time: 0 hours 5 minutes 48 seconds  ?Total Procedure Duration: 0 hours 12 minutes 6 seconds  ?Estimated Blood Loss:  Estimated blood loss: none. ?     [facility] ?

## 2021-05-17 ENCOUNTER — Encounter: Payer: Self-pay | Admitting: Gastroenterology

## 2021-05-31 ENCOUNTER — Encounter: Payer: Self-pay | Admitting: Family Medicine

## 2021-06-08 DIAGNOSIS — Z6831 Body mass index (BMI) 31.0-31.9, adult: Secondary | ICD-10-CM | POA: Diagnosis not present

## 2021-06-08 DIAGNOSIS — M25551 Pain in right hip: Secondary | ICD-10-CM | POA: Diagnosis not present

## 2021-06-14 ENCOUNTER — Telehealth (HOSPITAL_COMMUNITY): Payer: Self-pay | Admitting: Emergency Medicine

## 2021-06-14 NOTE — Telephone Encounter (Signed)
Reaching out to patient to offer assistance regarding upcoming cardiac imaging study; pt verbalizes understanding of appt date/time, parking situation and where to check in, pre-test NPO status and medications ordered, and verified current allergies; name and call back number provided for further questions should they arise Marchia Bond RN Navigator Cardiac Imaging [facility] [phone] office [phone] cell  Taking metoprolol 2 hr prior to scan Arrival 1100 Denies iv issues

## 2021-06-15 ENCOUNTER — Encounter (HOSPITAL_COMMUNITY): Payer: Self-pay

## 2021-06-15 ENCOUNTER — Ambulatory Visit (HOSPITAL_COMMUNITY)
Admission: RE | Admit: 2021-06-15 | Discharge: 2021-06-15 | Disposition: A | Discharge status: Home / Self Care | Payer: PPO | Source: Ambulatory Visit | Attending: Cardiology | Admitting: Cardiology

## 2021-06-15 DIAGNOSIS — I2541 Coronary artery aneurysm: Secondary | ICD-10-CM | POA: Diagnosis not present

## 2021-06-15 MED ORDER — NITROGLYCERIN 0.4 MG SL SUBL
SUBLINGUAL_TABLET | SUBLINGUAL | Status: AC
  Filled 2021-06-15: qty 2

## 2021-06-15 MED ORDER — METOPROLOL TARTRATE 5 MG/5ML IV SOLN: 10.0000 mg | INTRAVENOUS | Status: DC | PRN

## 2021-06-15 MED ORDER — IOHEXOL 350 MG/ML SOLN
100.0000 mL | Freq: Once | INTRAVENOUS | Status: AC | PRN
  Administered 2021-06-15: 100 mL via INTRAVENOUS

## 2021-06-15 MED ORDER — METOPROLOL TARTRATE 5 MG/5ML IV SOLN
INTRAVENOUS | Status: AC
  Administered 2021-06-15: 10 mg via INTRAVENOUS
  Filled 2021-06-15: qty 20

## 2021-06-15 MED ORDER — NITROGLYCERIN 0.4 MG SL SUBL
0.8000 mg | SUBLINGUAL_TABLET | Freq: Once | SUBLINGUAL | Status: AC
  Administered 2021-06-15: 0.8 mg via SUBLINGUAL

## 2021-06-26 DIAGNOSIS — M25551 Pain in right hip: Secondary | ICD-10-CM | POA: Diagnosis not present

## 2021-07-12 ENCOUNTER — Other Ambulatory Visit: Payer: Self-pay | Admitting: Family Medicine

## 2021-07-12 MED ORDER — GABAPENTIN 300 MG PO CAPS: 300.0000 mg | ORAL_CAPSULE | Freq: Every day | ORAL | 1 refills | Status: DC | PRN

## 2021-07-12 NOTE — Telephone Encounter (Signed)
Last ov 30/3/23  Last filled 02/15/21

## 2021-07-26 DIAGNOSIS — H02831 Dermatochalasis of right upper eyelid: Secondary | ICD-10-CM | POA: Diagnosis not present

## 2021-07-26 DIAGNOSIS — H18413 Arcus senilis, bilateral: Secondary | ICD-10-CM | POA: Diagnosis not present

## 2021-07-26 DIAGNOSIS — I10 Essential (primary) hypertension: Secondary | ICD-10-CM | POA: Diagnosis not present

## 2021-07-26 DIAGNOSIS — H26491 Other secondary cataract, right eye: Secondary | ICD-10-CM | POA: Diagnosis not present

## 2021-08-02 DIAGNOSIS — H26492 Other secondary cataract, left eye: Secondary | ICD-10-CM | POA: Diagnosis not present

## 2021-08-17 ENCOUNTER — Encounter: Payer: Self-pay | Admitting: Cardiology

## 2021-08-17 ENCOUNTER — Ambulatory Visit: Payer: PPO | Admitting: Cardiology

## 2021-08-17 VITALS — BP 150/80 | HR 76 | Ht 67.0 in | Wt 199.0 lb

## 2021-08-17 DIAGNOSIS — E785 Hyperlipidemia, unspecified: Secondary | ICD-10-CM | POA: Diagnosis not present

## 2021-08-17 DIAGNOSIS — I2584 Coronary atherosclerosis due to calcified coronary lesion: Secondary | ICD-10-CM

## 2021-08-17 DIAGNOSIS — I2510 Atherosclerotic heart disease of native coronary artery without angina pectoris: Secondary | ICD-10-CM

## 2021-08-17 DIAGNOSIS — I2541 Coronary artery aneurysm: Secondary | ICD-10-CM

## 2021-08-17 NOTE — Patient Instructions (Signed)

## 2021-08-17 NOTE — Progress Notes (Signed)
Cardiology Office Note:    Date:  08/17/2021   ID:  Robin Gilmore, DOB November 08, 1950, MRN [id number]  PCP:  Abner Greenspan, MD   [facility] Providers Cardiologist:  Candee Furbish, MD     Referring MD: Abner Greenspan, MD    History of Present Illness:    Robin Gilmore is a 71 y.o. female here for the follow up of hypertension, hyperlipidemia, and mitral valve prolapse.   Previously here for follow-up of coronary artery disease, prior coronary fistula, coronary artery aneurysm seen on CT.  Her mother had bypass surgery.  Originally she is only with palpitations racing pain and chest pressure.  During the work-up, coronary CT scan uncovered the fistula as well as coronary artery aneurysms. 2 saccular coronary artery aneurysms. The first aneurysm measures 17 mm x 18 mm and comes off the proximal LCX. The second aneurysm measures 21 mm x 16 mm  She has retired.  Has had some leg discomfort.  Has gained some weight.  At her last appointment, her blood pressure had been somewhat elevated at home, so her Toprol was increased to 50 mg.   Today:   She states she has been feeling well.   A couple weeks ago, she felt chest discomfort/palpitations that  was "about to increase" similar to what she experienced before, but her heart rate did not increase. She recalls still feeling "weird," and that she experienced a sensation like a skipping heart beat. When she checked her carotid pulse, she detected skipped heart beats.  She had to go to physical therapy and recalls how tense she frequently was at her appointments.   She denies any shortness of breath, or peripheral edema. No lightheadedness, headaches, syncope, orthopnea, or PND.   Past Medical History:  Diagnosis Date   Constipation    Coronary artery disease    Hyperlipidemia    Hypothyroid    IBS (irritable bowel syndrome)    Menopausal syndrome    Mitral prolapse and regurge used to have prophylaxis   Obesity     Past  Surgical History:  Procedure Laterality Date   BREAST EXCISIONAL BIOPSY Right    BREAST SURGERY     breast biopsy x 2 benign   CESAREAN SECTION     COLONOSCOPY WITH PROPOFOL N/A 05/16/2021   Procedure: COLONOSCOPY WITH PROPOFOL;  Surgeon: Lucilla Lame, MD;  Location: [facility] ENDOSCOPY;  Service: Endoscopy;  Laterality: N/A;   retrovaginal fistula repair      Current Medications: Current Meds  Medication Sig   amLODipine (NORVASC) 5 MG tablet Take 1 tablet (5 mg total) by mouth daily.   gabapentin (NEURONTIN) 300 MG capsule Take 1 capsule (300 mg total) by mouth daily as needed (leg pain).   levothyroxine (SYNTHROID) 150 MCG tablet Take 1 tablet (150 mcg total) by mouth daily before breakfast.   metoprolol succinate (TOPROL-XL) 50 MG 24 hr tablet Take 1 tablet (50 mg total) by mouth daily. Take with or immediately following a meal.   rosuvastatin (CRESTOR) 10 MG tablet Take 1 tablet (10 mg total) by mouth daily.     Allergies:   Codeine   Social History   Socioeconomic History   Marital status: Married    Spouse name: Not on file   Number of children: Not on file   Years of education: Not on file   Highest education level: Not on file  Occupational History   Not on file  Tobacco Use   Smoking status: Former  Smokeless tobacco: Never  Vaping Use   Vaping Use: Never used  Substance and Sexual Activity   Alcohol use: No    Alcohol/week: 0.0 standard drinks of alcohol   Drug use: No   Sexual activity: Never  Other Topics Concern   Not on file  Social History Narrative   Not on file   Social Determinants of Health   Financial Resource Strain: Low Risk  (12/16/2019)   Overall Financial Resource Strain (CARDIA)    Difficulty of Paying Living Expenses: Not hard at all  Food Insecurity: No Food Insecurity (12/16/2019)   Hunger Vital Sign    Worried About Running Out of Food in the Last Year: Never true    Ran Out of Food in the Last Year: Never true  Transportation Needs: No  Transportation Needs (12/16/2019)   PRAPARE - Hydrologist (Medical): No    Lack of Transportation (Non-Medical): No  Physical Activity: Inactive (12/16/2019)   Exercise Vital Sign    Days of Exercise per Week: 0 days    Minutes of Exercise per Session: 0 min  Stress: No Stress Concern Present (12/16/2019)   [place]    Feeling of Stress : Not at all  Social Connections: Not on file     Family History: The patient's family history includes Alcohol abuse in her brother, father, and mother; Cancer in her father; Colon polyps in her brother; Diabetes in her mother; Heart disease in her mother; Hyperlipidemia in her sister; Hypertension in her sister; Obesity in her maternal grandfather. There is no history of Breast cancer.  ROS:   Please see the history of present illness.    (+) Chest discomfort  All other systems reviewed and are negative.  EKGs/Labs/Other Studies Reviewed:    The following studies were reviewed today  Coronary CT scan 06/15/2021: FINDINGS: Image quality: Excellent.   Noise artifact is: Limited.   Coronary Arteries:  Normal coronary origin.  Right dominance.   Left main: The left main is a large caliber vessel with a normal take off from the left coronary cusp that bifurcates to form a left anterior descending artery and a left circumflex artery. There is no plaque or stenosis.   Left anterior descending artery: The LAD is patent without plaque or stenosis. The LAD gives off 1 patent diagonal branch.   Left circumflex artery: The LCX is non-dominant and patent with no evidence of plaque or stenosis. The LCX gives off 1 patent obtuse marginal branches. There are 2 saccular coronary artery aneurysms. The first aneurysm measures 17 mm x 18 mm and comes off the proximal LCX. The second aneurysm measures 21 mm x 16 mm and contains a fistulous connection between the OM  branch and the right pulmonary artery. The aneurysms are similar in size to the prior study.   Right coronary artery: The RCA is dominant with normal take off from the right coronary cusp. There is no evidence of plaque or stenosis. The RCA terminates as a PDA and right posterolateral branch without evidence of plaque or stenosis.   Right Atrium: Right atrial size is within normal limits.   Right Ventricle: The right ventricular cavity is within normal limits.   Left Atrium: Left atrial size is normal in size with no left atrial appendage filling defect.   Left Ventricle: The ventricular cavity size is within normal limits.   Pulmonary arteries: Normal in size without proximal filling defect.  Pulmonary veins: Normal pulmonary venous drainage.   Pericardium: Normal thickness without significant effusion or calcium present.   Cardiac valves: The aortic valve is trileaflet without significant calcification. The mitral valve is normal without significant calcification.   Aorta: Normal caliber without significant disease.   Extra-cardiac findings: See attached radiology report for non-cardiac structures.   IMPRESSION: 1. Coronary calcium score of 1195 which is 97th percentile. Calcifications in the coronary artery aneurysms.   2. Normal coronary origin with right dominance.   3. No coronary artery disease.   4. 2 saccular coronary artery aneurysms noted in the LCX which are similar in appearance to the prior study. The second aneurysm contains a fistulous connection to the right pulmonary artery which is known.   Coronary CT scan 11/19/2019: 1. Coronary calcium score of 1100. This was 97th percentile for age and sex matched control. Calcifications noted mainly in the left circumflex aneurysms.   2. Normal coronary origin with right dominance.   3. Two very large (gigantic), calcified coronary artery aneurysms located in the left circumflex causing minimal stenosis,  <25%   4. CAD-RADS 1. Minimal non-obstructive CAD (0-24%). Consider non-atherosclerotic causes of chest pain. Consider preventive therapy and risk factor modification.   Electronically Signed: By: Kate Sable M.D.   Echocardiogram 11/18/2019:   1. Left ventricular ejection fraction, by estimation, is 60 to 65%. The  left ventricle has normal function. The left ventricle has no regional  wall motion abnormalities. Left ventricular diastolic parameters are  consistent with Grade I diastolic  dysfunction (impaired relaxation). Elevated left ventricular end-diastolic  pressure. The average left ventricular global longitudinal strain is -31.2  %. The global longitudinal strain is normal.   2. Right ventricular systolic function is normal. The right ventricular  size is normal. There is normal pulmonary artery systolic pressure.   3. The mitral valve is normal in structure. Trivial mitral valve  regurgitation. No evidence of mitral stenosis.   4. The aortic valve is tricuspid. There is mild calcification of the  aortic valve. There is mild thickening of the aortic valve. Aortic valve  regurgitation is trivial. No aortic stenosis is present.   5. Aortic dilatation noted. There is mild dilatation of the ascending  aorta, measuring 39 mm.   6. The inferior vena cava is normal in size with greater than 50%  respiratory variability, suggesting right atrial pressure of 3 mmHg.   Zio patch monitor 11/24/2019: Sinus rhythm with average heart rate of 68 bpm. Rare episodes of paroxysmal atrial tachycardia, ranging from 10 beats at 150 bpm, to 22 seconds in duration at 100 bpm. 2 separate instances of second-degree heart block type I, Wenckebach-1 occurring at 6 AM another occurring at 8 AM on separate days. Rare PVC No pauses greater than 3 seconds, no atrial fibrillation, no ventricular tachycardia.   Prior episodes of tachycardia may have been paroxysmal atrial tachycardia.  At last  clinic visit I started her on Toprol-XL 50 mg once a day.  Since she does have Wenkebach, I would like to go ahead and decrease this to 25 mg once a day.    EKG: EKG is personally reviewed.  08/17/21: EKG was not ordered. 02/03/21: sinus rhythm 87 no other changes  Recent Labs: 03/17/2021: ALT 19; BUN 19; Creat 0.56; Hemoglobin 14.8; Platelets 345; Potassium 4.2; Sodium 137; TSH 0.98  Recent Lipid Panel    Component Value Date/Time   CHOL 163 03/17/2021 1506   TRIG 174 (H) 03/17/2021 1506   HDL  57 03/17/2021 1506   CHOLHDL 2.9 03/17/2021 1506   VLDL 29.6 12/16/2019 0815   LDLCALC 79 03/17/2021 1506   LDLDIRECT 90.0 11/03/2018 0840     Risk Assessment/Calculations:              Physical Exam:    VS:  BP (!) 150/80 (BP Location: Left Arm, Patient Position: Sitting, Cuff Size: Normal)   Pulse 76   Ht '5\' 7"'$  (1.702 m)   Wt 199 lb (90.3 kg)   SpO2 96%   BMI 31.17 kg/m     Wt Readings from Last 3 Encounters:  08/17/21 199 lb (90.3 kg)  05/16/21 198 lb 6.6 oz (90 kg)  03/17/21 201 lb 6.4 oz (91.4 kg)     GEN:  Well nourished, well developed in no acute distress HEENT: Normal NECK: No JVD; No carotid bruits LYMPHATICS: No lymphadenopathy CARDIAC: RRR, 1/6 systolic murmur, no rubs, no gallops RESPIRATORY:  Clear to auscultation without rales, wheezing or rhonchi  ABDOMEN: Soft, non-tender, non-distended MUSCULOSKELETAL:  No edema; No deformity  SKIN: Warm and dry NEUROLOGIC:  Alert and oriented x 3 PSYCHIATRIC:  Normal affect   ASSESSMENT:    1. Coronary aneurysm   2. Hyperlipidemia LDL goal <130   3. Coronary artery calcification     PLAN:    In order of problems listed above:  Coronary aneurysm Repeat CT shows stability in her saccular aneurysms of coronary arteries. We will skip the next year and do a 2-year follow-up.  Once again this was discussed with multiple team members including interventional cardiology and cardiothoracic surgery.  Multidisciplinary  approach.  No chest pain.   Hyperlipidemia LDL goal <130 Continue with Crestor 10 mg.  Excellent.  No myalgias.  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  Tortuous vessel seen on CT scan and that there is no significant extravasation of contrast into the right PA.  The distal coronary and of the fistula tract forms an aneurysm.  Stable   Essential hypertension Prior visit increased Toprol to 50 mg.  Continue with amlodipine 5 mg.  Next step would be to add ARB for instance such as irbesartan.  Slightly elevated today.  Does have some degree of whitecoat hypertension.    Wenkebach -Could be the sensation that she is feeling with occasional skipped beat.  Continue to monitor.  No high risk symptoms such as syncope   Follow-up: 1 year  Medication Adjustments/Labs and Tests Ordered: Current medicines are reviewed at length with the patient today.  Concerns regarding medicines are outlined above.  No orders of the defined types were placed in this encounter.  No orders of the defined types were placed in this encounter.   Patient Instructions  Medication Instructions:  The current medical regimen is effective;  continue present plan and medications.  *If you need a refill on your cardiac medications before your next appointment, please call your pharmacy*  Follow-Up: At [facility], you and your health needs are our priority.  As part of our continuing mission to provide you with exceptional heart care, we have created designated Provider Care Teams.  These Care Teams include your primary Cardiologist (physician) and Advanced Practice Providers (APPs -  Physician Assistants and Nurse Practitioners) who all work together to provide you with the care you need, when you need it.  We recommend signing up for the patient portal called "MyChart".  Sign up information is provided on  this After Visit Summary.  MyChart is  used to connect with patients for Virtual Visits (Telemedicine).  Patients are able to view lab/test results, encounter notes, upcoming appointments, etc.  Non-urgent messages can be sent to your provider as well.   To learn more about what you can do with MyChart, go to [email or web address].    Your next appointment:   1 year(s)  The format for your next appointment:   In Person  Provider:   Candee Furbish, MD {   Important Information About Sugar          I,Breanna Adamick,acting as a scribe for Candee Furbish, MD.,have documented all relevant documentation on the behalf of Candee Furbish, MD,as directed by  Candee Furbish, MD while in the presence of Candee Furbish, MD.   I, Candee Furbish, MD, have reviewed all documentation for this visit. The documentation on 08/17/21 for the exam, diagnosis, procedures, and orders are all accurate and complete.   Signed, Candee Furbish, MD  08/17/2021 2:22 PM    [facility]

## 2021-09-12 DIAGNOSIS — H02834 Dermatochalasis of left upper eyelid: Secondary | ICD-10-CM | POA: Diagnosis not present

## 2021-09-12 DIAGNOSIS — H53483 Generalized contraction of visual field, bilateral: Secondary | ICD-10-CM | POA: Diagnosis not present

## 2021-09-12 DIAGNOSIS — H0279 Other degenerative disorders of eyelid and periocular area: Secondary | ICD-10-CM | POA: Diagnosis not present

## 2021-09-12 DIAGNOSIS — H02422 Myogenic ptosis of left eyelid: Secondary | ICD-10-CM | POA: Diagnosis not present

## 2021-09-12 DIAGNOSIS — H02423 Myogenic ptosis of bilateral eyelids: Secondary | ICD-10-CM | POA: Diagnosis not present

## 2021-09-12 DIAGNOSIS — H02421 Myogenic ptosis of right eyelid: Secondary | ICD-10-CM | POA: Diagnosis not present

## 2021-09-12 DIAGNOSIS — H02413 Mechanical ptosis of bilateral eyelids: Secondary | ICD-10-CM | POA: Diagnosis not present

## 2021-09-12 DIAGNOSIS — H57813 Brow ptosis, bilateral: Secondary | ICD-10-CM | POA: Diagnosis not present

## 2021-09-12 DIAGNOSIS — H02831 Dermatochalasis of right upper eyelid: Secondary | ICD-10-CM | POA: Diagnosis not present

## 2021-09-21 DIAGNOSIS — H02403 Unspecified ptosis of bilateral eyelids: Secondary | ICD-10-CM | POA: Diagnosis not present

## 2021-09-28 DIAGNOSIS — M25551 Pain in right hip: Secondary | ICD-10-CM | POA: Diagnosis not present

## 2021-11-07 ENCOUNTER — Telehealth: Payer: Self-pay | Admitting: *Deleted

## 2021-11-07 ENCOUNTER — Telehealth: Payer: Self-pay

## 2021-11-07 NOTE — Telephone Encounter (Signed)
   Pre-operative Risk Assessment    Patient Name: Robin Gilmore  DOB: [birth year] MRN: [id number]      Request for Surgical Clearance    Procedure:   BILATERAL UPPER EYELID BLEPHAROPTOSIS REPAIR AND BLEPHAROPLASTY  Date of Surgery:  Clearance 11/20/21                                 Surgeon:  DR. Isidoro Donning Surgeon's Group or Practice Name:  [facility] Phone number:  [phone] Fax number:  [phone]   Type of Clearance Requested:   - Medical  - Pharmacy:  Hold Aspirin NOT INDICATED HOW LONG   ASPIRIN IS MARKED ON THE CLEARANCE BUT DON'T SEE ON PT'S MED LIST    Type of Anesthesia:  MAC   Additional requests/questions:    [name]   11/07/2021, 7:36 AM

## 2021-11-07 NOTE — Telephone Encounter (Signed)
  Patient Consent for Virtual Visit         Robin Gilmore has provided verbal consent on 11/07/2021 for a virtual visit (video or telephone).   CONSENT FOR VIRTUAL VISIT FOR:  Robin Gilmore  By participating in this virtual visit I agree to the following:  I hereby voluntarily request, consent and authorize [facility] and its employed or contracted physicians, physician assistants, nurse practitioners or other licensed health care professionals (the Practitioner), to provide me with telemedicine health care services (the "Services") as deemed necessary by the treating Practitioner. I acknowledge and consent to receive the Services by the Practitioner via telemedicine. I understand that the telemedicine visit will involve communicating with the Practitioner through live audiovisual communication technology and the disclosure of certain medical information by electronic transmission. I acknowledge that I have been given the opportunity to request an in-person assessment or other available alternative prior to the telemedicine visit and am voluntarily participating in the telemedicine visit.  I understand that I have the right to withhold or withdraw my consent to the use of telemedicine in the course of my care at any time, without affecting my right to future care or treatment, and that the Practitioner or I may terminate the telemedicine visit at any time. I understand that I have the right to inspect all information obtained and/or recorded in the course of the telemedicine visit and may receive copies of available information for a reasonable fee.  I understand that some of the potential risks of receiving the Services via telemedicine include:  Delay or interruption in medical evaluation due to technological equipment failure or disruption; Information transmitted may not be sufficient (e.g. poor resolution of images) to allow for appropriate medical decision making by the  Practitioner; and/or  In rare instances, security protocols could fail, causing a breach of personal health information.  Furthermore, I acknowledge that it is my responsibility to provide information about my medical history, conditions and care that is complete and accurate to the best of my ability. I acknowledge that Practitioner's advice, recommendations, and/or decision may be based on factors not within their control, such as incomplete or inaccurate data provided by me or distortions of diagnostic images or specimens that may result from electronic transmissions. I understand that the practice of medicine is not an exact science and that Practitioner makes no warranties or guarantees regarding treatment outcomes. I acknowledge that a copy of this consent can be made available to me via my patient portal ([facility]), or I can request a printed copy by calling the office of [facility].    I understand that my insurance will be billed for this visit.   I have read or had this consent read to me. I understand the contents of this consent, which adequately explains the benefits and risks of the Services being provided via telemedicine.  I have been provided ample opportunity to ask questions regarding this consent and the Services and have had my questions answered to my satisfaction. I give my informed consent for the services to be provided through the use of telemedicine in my medical care

## 2021-11-07 NOTE — Telephone Encounter (Signed)
Spoke with the patient and made her aware that a telehealth appointment is needed for cardiac clearance. Patient agreeable and follow up appointment has been made.   Consent given and medication list reviewed.

## 2021-11-07 NOTE — Telephone Encounter (Signed)
Please set up for virtual visit for clearance. TY!  Loel Dubonnet, NP

## 2021-11-07 NOTE — Telephone Encounter (Addendum)
Left message for the patient to contact the office and speak with someone in the pre-op pool.

## 2021-11-09 ENCOUNTER — Ambulatory Visit (INDEPENDENT_AMBULATORY_CARE_PROVIDER_SITE_OTHER): Payer: PPO | Admitting: Family

## 2021-11-09 ENCOUNTER — Encounter (HOSPITAL_BASED_OUTPATIENT_CLINIC_OR_DEPARTMENT_OTHER): Payer: Self-pay | Admitting: Family

## 2021-11-09 DIAGNOSIS — Z01818 Encounter for other preprocedural examination: Secondary | ICD-10-CM

## 2021-11-09 NOTE — Progress Notes (Signed)
Virtual Visit via Telephone Note   Because of Robin Gilmore's co-morbid illnesses, she is at least at moderate risk for complications without adequate follow up.  This format is felt to be most appropriate for this patient at this time.  The patient did not have access to video technology/had technical difficulties with video requiring transitioning to audio format only (telephone).  All issues noted in this document were discussed and addressed.  No physical exam could be performed with this format.  Please refer to the patient's chart for her consent to telehealth for [facility].    Date:  11/09/2021   ID:  Robin Gilmore, DOB 01-30-1950, MRN [id number] The patient was identified using 2 identifiers.  Patient Location: Home Provider Location: Home Office   PCP:  Tower, Wynelle Fanny, MD   [place] [facility] Cardiologist:  Candee Furbish, MD     Evaluation Performed:  Follow-Up Visit  Chief Complaint:  Preop clearance  History of Present Illness:    Robin Gilmore is a 71 y.o. female with HTN, HLD, MVP, coronary artery aneurysm, congenital coronary artery fistula,. Last seen 08/2021 by Dr. Marlou Porch doing well from cardiac perspective recommended to follow up in one year.   Presents today for preoperative clearance for bilateral upper eyelid blepharoptosis repair and bletharoplasty. Enjoys being active in her church. Exercises by walking, playing with her grandchildren, and working in her yard. Reports no shortness of breath nor dyspnea on exertion. Reports no chest pain, pressure, or tightness. No edema, orthopnea, PND. Reports no palpitations.    Past Medical History:  Diagnosis Date   Constipation    Coronary artery disease    Hyperlipidemia    Hypothyroid    IBS (irritable bowel syndrome)    Menopausal syndrome    Mitral prolapse and regurge used to have prophylaxis   Obesity    Past Surgical History:  Procedure Laterality Date   BREAST  EXCISIONAL BIOPSY Right    BREAST SURGERY     breast biopsy x 2 benign   CESAREAN SECTION     COLONOSCOPY WITH PROPOFOL N/A 05/16/2021   Procedure: COLONOSCOPY WITH PROPOFOL;  Surgeon: Lucilla Lame, MD;  Location: [facility] ENDOSCOPY;  Service: Endoscopy;  Laterality: N/A;   retrovaginal fistula repair       Current Meds  Medication Sig   amLODipine (NORVASC) 5 MG tablet Take 1 tablet (5 mg total) by mouth daily.   aspirin EC 81 MG tablet Take 81 mg by mouth daily. Swallow whole.   gabapentin (NEURONTIN) 300 MG capsule Take 1 capsule (300 mg total) by mouth daily as needed (leg pain).   levothyroxine (SYNTHROID) 150 MCG tablet Take 1 tablet (150 mcg total) by mouth daily before breakfast.   metoprolol succinate (TOPROL-XL) 50 MG 24 hr tablet Take 1 tablet (50 mg total) by mouth daily. Take with or immediately following a meal.   rosuvastatin (CRESTOR) 10 MG tablet Take 1 tablet (10 mg total) by mouth daily.     Allergies:   Codeine   Social History   Tobacco Use   Smoking status: Former   Smokeless tobacco: Never  Scientific laboratory technician Use: Never used  Substance Use Topics   Alcohol use: No    Alcohol/week: 0.0 standard drinks of alcohol   Drug use: No     Family Hx: The patient's family history includes Alcohol abuse in her brother, father, and mother; Cancer in her father; Colon polyps in her brother; Diabetes in  her mother; Heart disease in her mother; Hyperlipidemia in her sister; Hypertension in her sister; Obesity in her maternal grandfather. There is no history of Breast cancer.  ROS:   Please see the history of present illness.     All other systems reviewed and are negative.   Prior CV studies:   The following studies were reviewed today:  Coronary CT scan 06/15/2021: FINDINGS: Image quality: Excellent.   Noise artifact is: Limited.   Coronary Arteries:  Normal coronary origin.  Right dominance.   Left main: The left main is a large caliber vessel with a  normal take off from the left coronary cusp that bifurcates to form a left anterior descending artery and a left circumflex artery. There is no plaque or stenosis.   Left anterior descending artery: The LAD is patent without plaque or stenosis. The LAD gives off 1 patent diagonal branch.   Left circumflex artery: The LCX is non-dominant and patent with no evidence of plaque or stenosis. The LCX gives off 1 patent obtuse marginal branches. There are 2 saccular coronary artery aneurysms. The first aneurysm measures 17 mm x 18 mm and comes off the proximal LCX. The second aneurysm measures 21 mm x 16 mm and contains a fistulous connection between the OM branch and the right pulmonary artery. The aneurysms are similar in size to the prior study.   Right coronary artery: The RCA is dominant with normal take off from the right coronary cusp. There is no evidence of plaque or stenosis. The RCA terminates as a PDA and right posterolateral branch without evidence of plaque or stenosis.   Right Atrium: Right atrial size is within normal limits.   Right Ventricle: The right ventricular cavity is within normal limits.   Left Atrium: Left atrial size is normal in size with no left atrial appendage filling defect.   Left Ventricle: The ventricular cavity size is within normal limits.   Pulmonary arteries: Normal in size without proximal filling defect.   Pulmonary veins: Normal pulmonary venous drainage.   Pericardium: Normal thickness without significant effusion or calcium present.   Cardiac valves: The aortic valve is trileaflet without significant calcification. The mitral valve is normal without significant calcification.   Aorta: Normal caliber without significant disease.   Extra-cardiac findings: See attached radiology report for non-cardiac structures.   IMPRESSION: 1. Coronary calcium score of 1195 which is 97th percentile. Calcifications in the coronary artery  aneurysms.   2. Normal coronary origin with right dominance.   3. No coronary artery disease.   4. 2 saccular coronary artery aneurysms noted in the LCX which are similar in appearance to the prior study. The second aneurysm contains a fistulous connection to the right pulmonary artery which is known.   Coronary CT scan 11/19/2019: 1. Coronary calcium score of 1100. This was 97th percentile for age and sex matched control. Calcifications noted mainly in the left circumflex aneurysms.   2. Normal coronary origin with right dominance.   3. Two very large (gigantic), calcified coronary artery aneurysms located in the left circumflex causing minimal stenosis, <25%   4. CAD-RADS 1. Minimal non-obstructive CAD (0-24%). Consider non-atherosclerotic causes of chest pain. Consider preventive therapy and risk factor modification.   Electronically Signed: By: Kate Sable M.D.    Echocardiogram 11/18/2019:   1. Left ventricular ejection fraction, by estimation, is 60 to 65%. The  left ventricle has normal function. The left ventricle has no regional  wall motion abnormalities. Left ventricular diastolic parameters  are  consistent with Grade I diastolic  dysfunction (impaired relaxation). Elevated left ventricular end-diastolic  pressure. The average left ventricular global longitudinal strain is -31.2  %. The global longitudinal strain is normal.   2. Right ventricular systolic function is normal. The right ventricular  size is normal. There is normal pulmonary artery systolic pressure.   3. The mitral valve is normal in structure. Trivial mitral valve  regurgitation. No evidence of mitral stenosis.   4. The aortic valve is tricuspid. There is mild calcification of the  aortic valve. There is mild thickening of the aortic valve. Aortic valve  regurgitation is trivial. No aortic stenosis is present.   5. Aortic dilatation noted. There is mild dilatation of the ascending  aorta,  measuring 39 mm.   6. The inferior vena cava is normal in size with greater than 50%  respiratory variability, suggesting right atrial pressure of 3 mmHg.   Zio patch monitor 11/24/2019: Sinus rhythm with average heart rate of 68 bpm. Rare episodes of paroxysmal atrial tachycardia, ranging from 10 beats at 150 bpm, to 22 seconds in duration at 100 bpm. 2 separate instances of second-degree heart block type I, Wenckebach-1 occurring at 6 AM another occurring at 8 AM on separate days. Rare PVC No pauses greater than 3 seconds, no atrial fibrillation, no ventricular tachycardia.   Prior episodes of tachycardia may have been paroxysmal atrial tachycardia.  At last clinic visit I started her on Toprol-XL 50 mg once a day.  Since she does have Wenkebach, I would like to go ahead and decrease this to 25 mg once a day.    Labs/Other Tests and Data Reviewed:    EKG:  No ECG reviewed.  Recent Labs: 03/17/2021: ALT 19; BUN 19; Creat 0.56; Hemoglobin 14.8; Platelets 345; Potassium 4.2; Sodium 137; TSH 0.98   Recent Lipid Panel Lab Results  Component Value Date/Time   CHOL 163 03/17/2021 03:06 PM   TRIG 174 (H) 03/17/2021 03:06 PM   HDL 57 03/17/2021 03:06 PM   CHOLHDL 2.9 03/17/2021 03:06 PM   LDLCALC 79 03/17/2021 03:06 PM   LDLDIRECT 90.0 11/03/2018 08:40 AM    Wt Readings from Last 3 Encounters:  08/17/21 199 lb (90.3 kg)  05/16/21 198 lb 6.6 oz (90 kg)  03/17/21 201 lb 6.4 oz (91.4 kg)    Objective:    Vital Signs:  No vital signs available for review.  ASSESSMENT & PLAN:    Preoperative clearance - Able to achieve >4  METS. Per AHA/ACC guidelines, she is deemed acceptable risk for the planned procedure without additional cardiovascular testing. Will route to surgical team so they are aware.   Antiplatelet: Per office protocol may hold Aspirin 7 days prior to planned procedure.   Time:   Today, I have spent 5 minutes with the patient with telehealth technology discussing the above  problems.    Medication Adjustments/Labs and Tests Ordered: Current medicines are reviewed at length with the patient today.  Concerns regarding medicines are outlined above.   Tests Ordered: No orders of the defined types were placed in this encounter.   Medication Changes: No orders of the defined types were placed in this encounter.   Follow Up:  In Person  08/2022 with Dr. Marlou Porch  Signed, Loel Dubonnet, NP  11/09/2021 3:05 PM    [place]

## 2021-11-09 NOTE — Patient Instructions (Signed)
Medication Instructions:   May hold Aspirin one week prior to planned procedure.   Resume after procedure at the direction of the surgeon.   *If you need a refill on your cardiac medications before your next appointment, please call your pharmacy*   Lab Work/Testing/Procedures: None   Follow-Up: At [facility], you and your health needs are our priority.  As part of our continuing mission to provide you with exceptional heart care, we have created designated Provider Care Teams.  These Care Teams include your primary Cardiologist (physician) and Advanced Practice Providers (APPs -  Physician Assistants and Nurse Practitioners) who all work together to provide you with the care you need, when you need it.  We recommend signing up for the patient portal called "MyChart".  Sign up information is provided on this After Visit Summary.  MyChart is used to connect with patients for Virtual Visits (Telemedicine).  Patients are able to view lab/test results, encounter notes, upcoming appointments, etc.  Non-urgent messages can be sent to your provider as well.   To learn more about what you can do with MyChart, go to [email or web address].    Your next appointment:   August 2024 with Dr. Marlou Porch - we will reach out to you closer to that time to schedule  Other Instructions  Heart Healthy Diet Recommendations: A low-salt diet is recommended. Meats should be grilled, baked, or boiled. Avoid fried foods. Focus on lean protein sources like fish or chicken with vegetables and fruits. The American Heart Association is a Microbiologist!  American Heart Association Diet and Lifeystyle Recommendations   Exercise recommendations: The American Heart Association recommends 150 minutes of moderate intensity exercise weekly. Try 30 minutes of moderate intensity exercise 4-5 times per week. This could include walking, jogging, or swimming.  Important Information About Sugar

## 2021-11-16 ENCOUNTER — Other Ambulatory Visit: Payer: Self-pay

## 2021-11-16 MED ORDER — ROSUVASTATIN CALCIUM 10 MG PO TABS: 10.0000 mg | ORAL_TABLET | Freq: Every day | ORAL | 3 refills | Status: DC

## 2021-11-16 MED ORDER — METOPROLOL SUCCINATE ER 50 MG PO TB24: 50.0000 mg | ORAL_TABLET | Freq: Every day | ORAL | 3 refills | Status: DC

## 2021-11-20 DIAGNOSIS — H57813 Brow ptosis, bilateral: Secondary | ICD-10-CM | POA: Diagnosis not present

## 2021-11-20 DIAGNOSIS — H02422 Myogenic ptosis of left eyelid: Secondary | ICD-10-CM | POA: Diagnosis not present

## 2021-11-20 DIAGNOSIS — H53483 Generalized contraction of visual field, bilateral: Secondary | ICD-10-CM | POA: Diagnosis not present

## 2021-11-20 DIAGNOSIS — H02413 Mechanical ptosis of bilateral eyelids: Secondary | ICD-10-CM | POA: Diagnosis not present

## 2021-11-20 DIAGNOSIS — H02834 Dermatochalasis of left upper eyelid: Secondary | ICD-10-CM | POA: Diagnosis not present

## 2021-11-20 DIAGNOSIS — H02421 Myogenic ptosis of right eyelid: Secondary | ICD-10-CM | POA: Diagnosis not present

## 2021-11-20 DIAGNOSIS — H02423 Myogenic ptosis of bilateral eyelids: Secondary | ICD-10-CM | POA: Diagnosis not present

## 2021-11-20 DIAGNOSIS — H02831 Dermatochalasis of right upper eyelid: Secondary | ICD-10-CM | POA: Diagnosis not present

## 2021-11-20 DIAGNOSIS — H0279 Other degenerative disorders of eyelid and periocular area: Secondary | ICD-10-CM | POA: Diagnosis not present

## 2021-12-22 ENCOUNTER — Encounter: Payer: Self-pay | Admitting: Family Medicine

## 2021-12-22 NOTE — Telephone Encounter (Signed)
Abstracted in Patient's chart

## 2022-01-01 DIAGNOSIS — M25551 Pain in right hip: Secondary | ICD-10-CM | POA: Diagnosis not present

## 2022-01-24 ENCOUNTER — Encounter: Payer: Self-pay | Admitting: Family Medicine

## 2022-01-24 DIAGNOSIS — E2839 Other primary ovarian failure: Secondary | ICD-10-CM

## 2022-01-24 MED ORDER — AMLODIPINE BESYLATE 5 MG PO TABS: 5.0000 mg | ORAL_TABLET | Freq: Every day | ORAL | 0 refills | Status: DC

## 2022-01-31 ENCOUNTER — Other Ambulatory Visit: Payer: Self-pay | Admitting: Family Medicine

## 2022-01-31 NOTE — Telephone Encounter (Signed)
Refill request  Gabapenting Last refill 07/12/21 #90/1 Last office visit 03/17/21 Upcoming appointment 03/19/22

## 2022-02-01 ENCOUNTER — Other Ambulatory Visit: Payer: Self-pay | Admitting: Family Medicine

## 2022-02-01 ENCOUNTER — Encounter: Payer: Self-pay | Admitting: Family Medicine

## 2022-02-01 DIAGNOSIS — Z1231 Encounter for screening mammogram for malignant neoplasm of breast: Secondary | ICD-10-CM

## 2022-03-08 IMAGING — MR MR SHOULDER*L* W/O CM
2 series · 40 of 40 positions shown · non-contrast
Comparison: None.

CLINICAL DATA: Left shoulder pain.  Injured shoulder in March 2019.

EXAM:
MRI OF THE LEFT SHOULDER WITHOUT CONTRAST
TECHNIQUE: Multiplanar, multisequence MR imaging of the shoulder was performed.
No intravenous contrast was administered.

[Series 4: PD fat-sat · oblique · left · 4.0mm · 0.44mm/px · 20 of 26 slices shown]
[im 1/26]
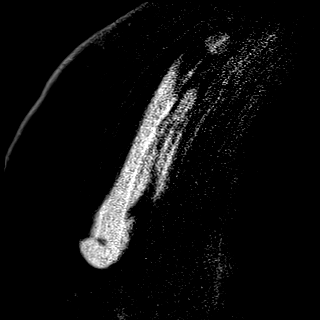
[im 2/26]
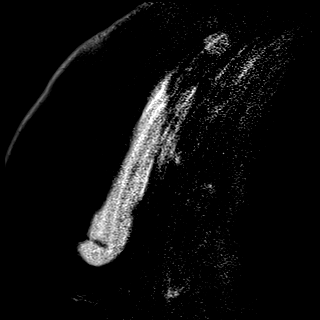
[im 3/26]
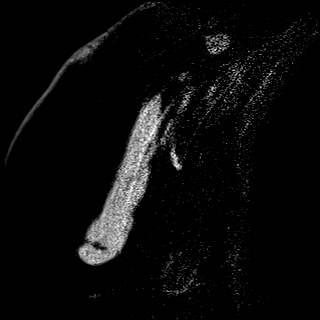
[im 4/26]
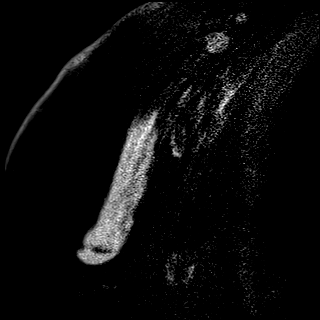
[im 6/26]
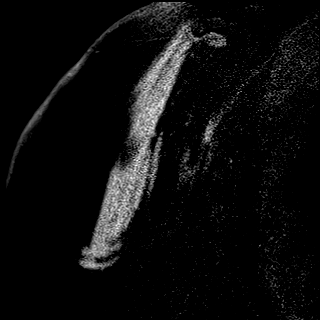
[im 7/26]
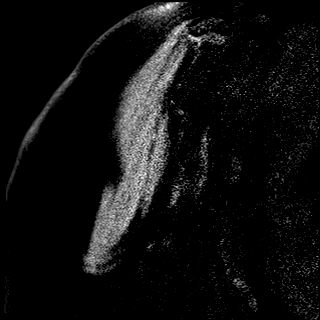
[im 8/26]
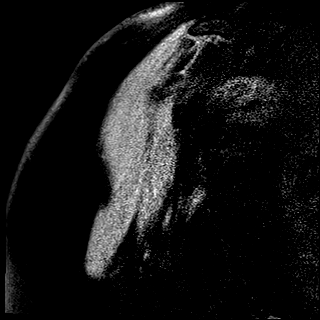
[im 10/26]
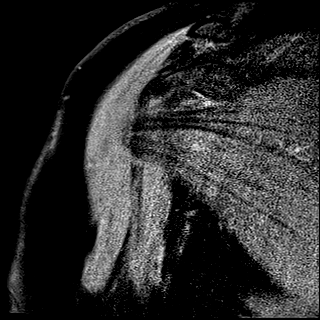
[im 11/26]
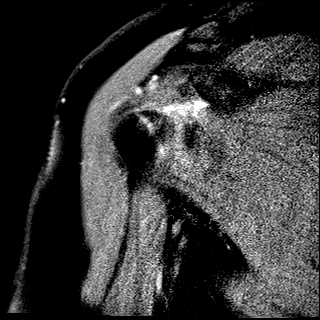
[im 12/26]
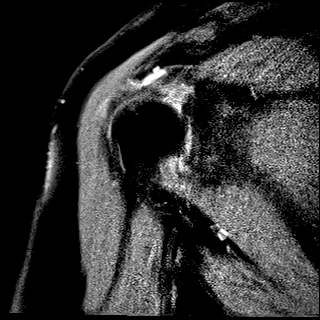
[im 14/26]
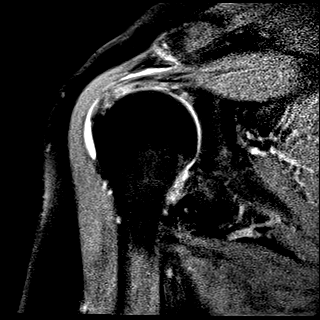
[im 15/26]
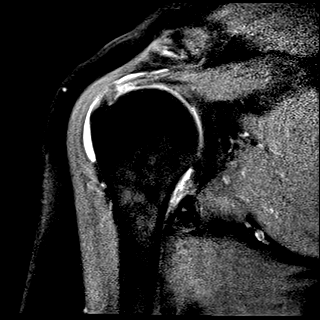
[im 16/26]
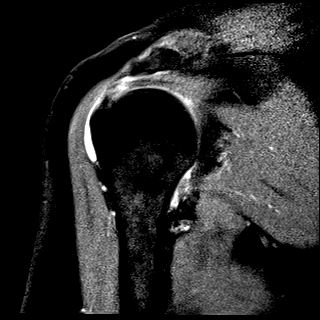
[im 18/26]
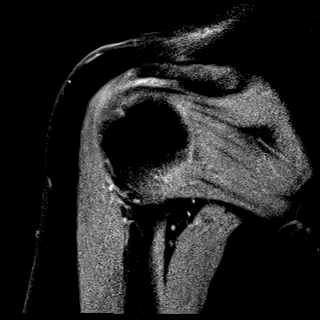
[im 19/26]
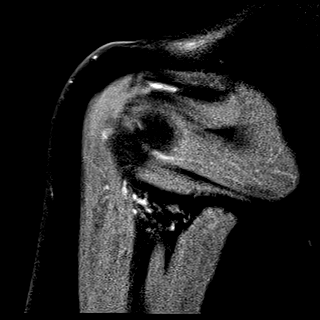
[im 20/26]
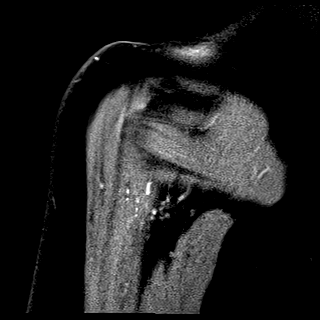
[im 22/26]
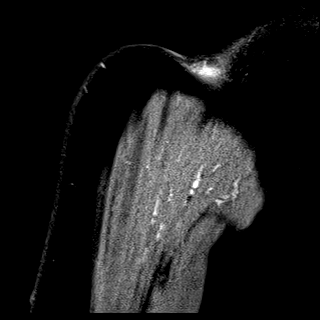
[im 23/26]
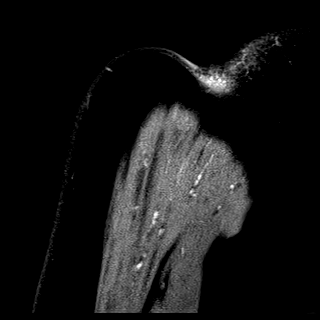
[im 24/26]
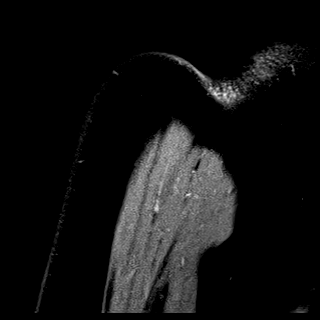
[im 26/26]
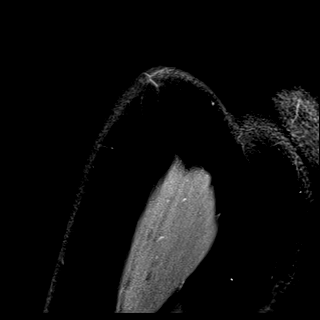

[Series 5: T2 fat-sat · oblique · left · 4.0mm · 0.44mm/px · 20 of 26 slices shown]
[im 1/26]
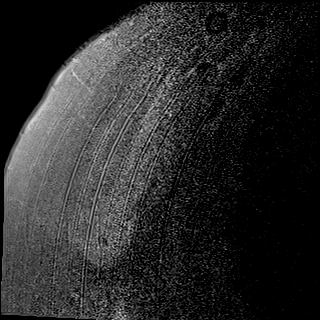
[im 2/26]
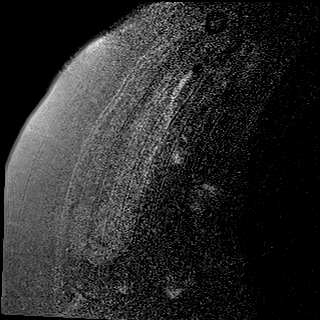
[im 3/26]
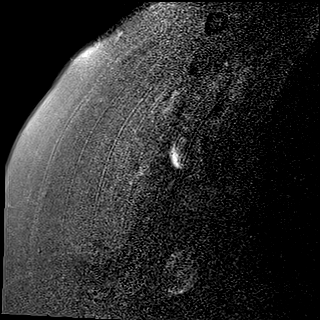
[im 4/26]
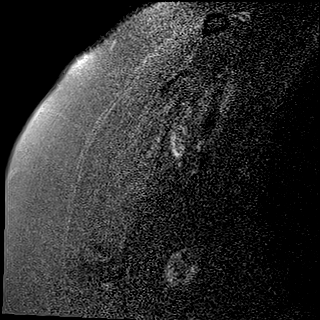
[im 6/26]
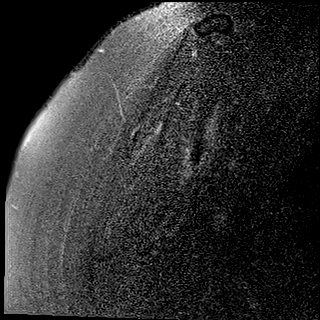
[im 7/26]
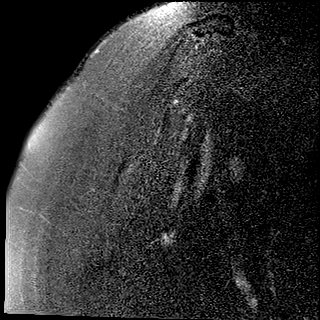
[im 8/26]
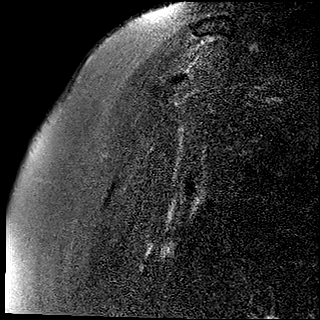
[im 10/26]
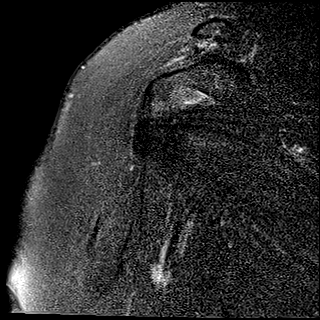
[im 11/26]
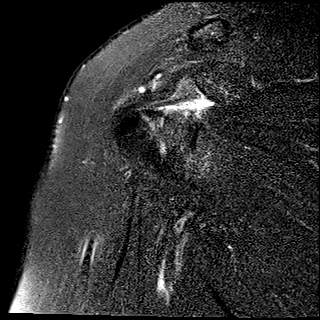
[im 12/26]
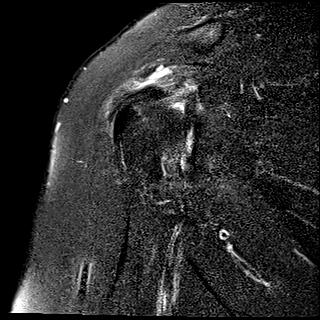
[im 14/26]
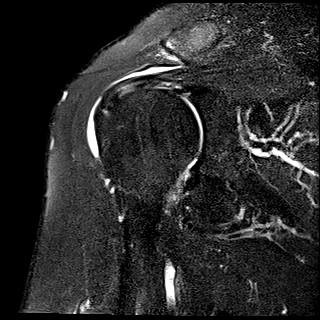
[im 15/26]
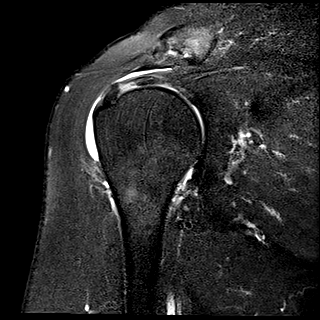
[im 16/26]
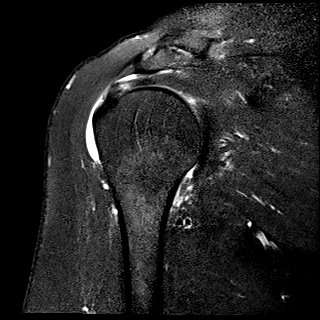
[im 18/26]
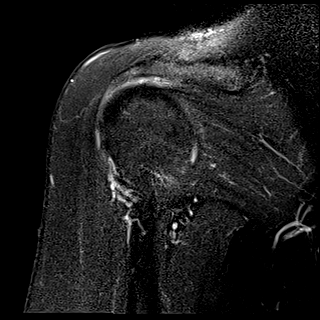
[im 19/26]
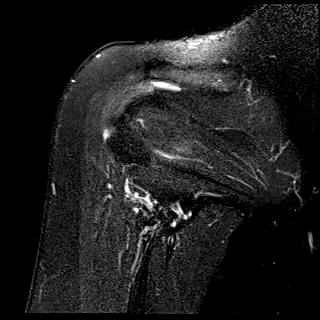
[im 20/26]
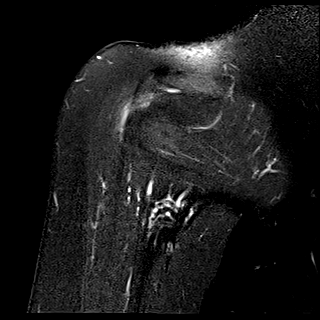
[im 22/26]
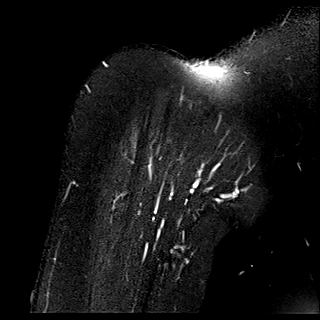
[im 23/26]
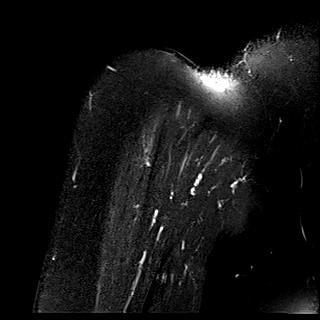
[im 24/26]
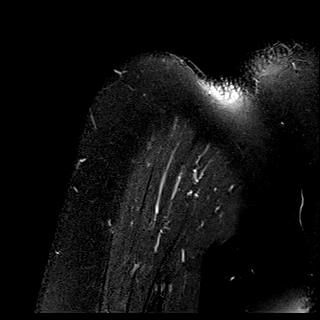
[im 26/26]
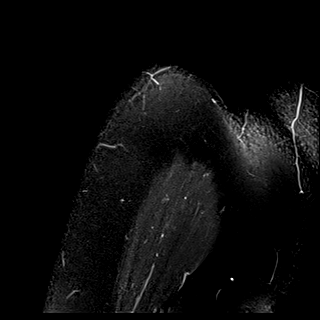

[40 of 40 positions shown; findings below may reference images not displayed]

FINDINGS: Rotator cuff: Mild underline rotator cuff tendinopathy/tendinosis.
This mainly involves the supraspinatus tendon. There is a partial
thickness articular surface tear in the critical zone region of the
supraspinatus tendon. Maximum laminar retraction of the articular
fibers is 7 mm and the tear is approximately 13 mm wide. It involves
greater than 50% of the depth of the tendon but does not breach the
bursal fibers. The subscapularis and infraspinatus tendons are
intact.

Muscles:  Unremarkable.

Biceps long head:  Intact

Acromioclavicular Joint: Moderate degenerative changes. Type 1
acromion. No significant lateral downsloping or undersurface
spurring.

Glenohumeral Joint: Mild degenerative changes. Small joint effusion.
There is thickening of the capsular structures in the axillary
recess which can be seen with adhesive capsulitis or synovitis.

Labrum:  No definite labral tears.

Bones:  No acute bony findings.

Other: Moderate amount of non focal fluid in the
subacromial/subdeltoid bursa. Findings could be due to moderate
bursitis. It is also possible that there is a small full-thickness
rotator cuff perforation that is not well demonstrated.
IMPRESSION: 1. Mild underline rotator cuff tendinopathy/tendinosis. There is a
partial thickness articular surface tear in the critical zone region
of the supraspinatus tendon. No full-thickness retracted tear.
2. Intact long head biceps tendon and glenoid labrum.
3. Moderate amount of non focal fluid in the subacromial/subdeltoid
bursa. Findings could be due to moderate bursitis. There also could
be a small full-thickness rotator cuff perforation that is not well
demonstrated.
4. There is thickening of the capsular structures in the axillary
recess which can be seen with adhesive capsulitis or synovitis.

## 2022-03-11 ENCOUNTER — Telehealth: Payer: Self-pay | Admitting: Family Medicine

## 2022-03-11 DIAGNOSIS — R7309 Other abnormal glucose: Secondary | ICD-10-CM

## 2022-03-11 DIAGNOSIS — E039 Hypothyroidism, unspecified: Secondary | ICD-10-CM

## 2022-03-11 DIAGNOSIS — I10 Essential (primary) hypertension: Secondary | ICD-10-CM

## 2022-03-11 DIAGNOSIS — E785 Hyperlipidemia, unspecified: Secondary | ICD-10-CM

## 2022-03-11 NOTE — Telephone Encounter (Signed)
-----   Message from Velna Hatchet, RT sent at 02/27/2022  3:56 PM EST ----- Regarding: Mon 2/26 lab Patient is scheduled for cpx, please order future labs.  Thanks, Anda Kraft

## 2022-03-12 ENCOUNTER — Other Ambulatory Visit (INDEPENDENT_AMBULATORY_CARE_PROVIDER_SITE_OTHER): Payer: PPO

## 2022-03-12 DIAGNOSIS — I10 Essential (primary) hypertension: Secondary | ICD-10-CM | POA: Diagnosis not present

## 2022-03-12 DIAGNOSIS — R7309 Other abnormal glucose: Secondary | ICD-10-CM

## 2022-03-12 DIAGNOSIS — E785 Hyperlipidemia, unspecified: Secondary | ICD-10-CM | POA: Diagnosis not present

## 2022-03-12 DIAGNOSIS — E039 Hypothyroidism, unspecified: Secondary | ICD-10-CM

## 2022-03-12 LAB — CBC WITH DIFFERENTIAL/PLATELET
Basophils Absolute: 0.1 10*3/uL (ref 0.0–0.1)
Basophils Relative: 0.9 % (ref 0.0–3.0)
Eosinophils Absolute: 0.2 10*3/uL (ref 0.0–0.7)
Eosinophils Relative: 2.5 % (ref 0.0–5.0)
HCT: 42.3 % (ref 36.0–46.0)
Hemoglobin: 14.2 g/dL (ref 12.0–15.0)
Lymphocytes Relative: 21.4 % (ref 12.0–46.0)
Lymphs Abs: 1.4 10*3/uL (ref 0.7–4.0)
MCHC: 33.5 g/dL (ref 30.0–36.0)
MCV: 85.9 fl (ref 78.0–100.0)
Monocytes Absolute: 0.7 10*3/uL (ref 0.1–1.0)
Monocytes Relative: 10.8 % (ref 3.0–12.0)
Neutro Abs: 4.3 10*3/uL (ref 1.4–7.7)
Neutrophils Relative %: 64.4 % (ref 43.0–77.0)
Platelets: 307 10*3/uL (ref 150.0–400.0)
RBC: 4.92 Mil/uL (ref 3.87–5.11)
RDW: 14.1 % (ref 11.5–15.5)
WBC: 6.7 10*3/uL (ref 4.0–10.5)

## 2022-03-12 LAB — LIPID PANEL
Cholesterol: 123 mg/dL (ref 0–200)
HDL: 53.2 mg/dL (ref >39.00)
LDL Cholesterol: 43 mg/dL (ref 0–99)
NonHDL: 69.34
Total CHOL/HDL Ratio: 2
Triglycerides: 131 mg/dL (ref 0.0–149.0)
VLDL: 26.2 mg/dL (ref 0.0–40.0)

## 2022-03-12 LAB — COMPREHENSIVE METABOLIC PANEL
ALT: 15 U/L (ref 0–35)
AST: 15 U/L (ref 0–37)
Albumin: 4.3 g/dL (ref 3.5–5.2)
Alkaline Phosphatase: 57 U/L (ref 39–117)
BUN: 17 mg/dL (ref 6–23)
CO2: 28 mEq/L (ref 19–32)
Calcium: 10 mg/dL (ref 8.4–10.5)
Chloride: 101 mEq/L (ref 96–112)
Creatinine, Ser: 0.61 mg/dL (ref 0.40–1.20)
GFR: 88.88 mL/min (ref >60.00)
Glucose, Bld: 121 mg/dL — ABNORMAL HIGH (ref 70–99)
Potassium: 4 mEq/L (ref 3.5–5.1)
Sodium: 139 mEq/L (ref 135–145)
Total Bilirubin: 0.8 mg/dL (ref 0.2–1.2)
Total Protein: 6.8 g/dL (ref 6.0–8.3)

## 2022-03-12 LAB — HEMOGLOBIN A1C: Hgb A1c MFr Bld: 5.5 % (ref 4.6–6.5)

## 2022-03-12 LAB — TSH: TSH: 3.07 u[IU]/mL (ref 0.35–5.50)

## 2022-03-19 ENCOUNTER — Encounter: Payer: Self-pay | Admitting: Family Medicine

## 2022-03-19 ENCOUNTER — Ambulatory Visit (INDEPENDENT_AMBULATORY_CARE_PROVIDER_SITE_OTHER): Payer: PPO

## 2022-03-19 ENCOUNTER — Ambulatory Visit (INDEPENDENT_AMBULATORY_CARE_PROVIDER_SITE_OTHER): Payer: PPO | Admitting: Family Medicine

## 2022-03-19 VITALS — BP 151/70 | HR 71 | Temp 97.6°F | Ht 66.0 in | Wt 196.4 lb

## 2022-03-19 VITALS — Ht 67.0 in | Wt 199.0 lb

## 2022-03-19 DIAGNOSIS — E2839 Other primary ovarian failure: Secondary | ICD-10-CM | POA: Diagnosis not present

## 2022-03-19 DIAGNOSIS — E785 Hyperlipidemia, unspecified: Secondary | ICD-10-CM

## 2022-03-19 DIAGNOSIS — I10 Essential (primary) hypertension: Secondary | ICD-10-CM | POA: Diagnosis not present

## 2022-03-19 DIAGNOSIS — Z6831 Body mass index (BMI) 31.0-31.9, adult: Secondary | ICD-10-CM

## 2022-03-19 DIAGNOSIS — R7309 Other abnormal glucose: Secondary | ICD-10-CM | POA: Diagnosis not present

## 2022-03-19 DIAGNOSIS — E6609 Other obesity due to excess calories: Secondary | ICD-10-CM | POA: Diagnosis not present

## 2022-03-19 DIAGNOSIS — E039 Hypothyroidism, unspecified: Secondary | ICD-10-CM

## 2022-03-19 DIAGNOSIS — Z0000 Encounter for general adult medical examination without abnormal findings: Secondary | ICD-10-CM | POA: Diagnosis not present

## 2022-03-19 MED ORDER — LEVOTHYROXINE SODIUM 150 MCG PO TABS: 150.0000 ug | ORAL_TABLET | Freq: Every day | ORAL | 3 refills | Status: DC

## 2022-03-19 MED ORDER — AMLODIPINE BESYLATE 10 MG PO TABS: 10.0000 mg | ORAL_TABLET | Freq: Every day | ORAL | 3 refills | Status: DC

## 2022-03-19 MED ORDER — GABAPENTIN 300 MG PO CAPS: ORAL_CAPSULE | ORAL | 3 refills | Status: DC

## 2022-03-19 NOTE — Patient Instructions (Addendum)
You are due for a tetanus shot - check price at pharmacy    Talk to your cardiologist about possible crestor side effects   Look for a chair yoga video  Get some light weights or bands and start working on strength    Go up on amlodipine to 10 mg once daily  If any side effects let us know  Consider getting a new bp cuff OMRON for arm - size regular   Follow up here in 4-6 weeks

## 2022-03-19 NOTE — Assessment & Plan Note (Signed)
Reviewed health habits including diet and exercise and skin cancer prevention Reviewed appropriate screening tests for age  Also reviewed health mt list, fam hx and immunization status , as well as social and family history   See HPI Labs reviewed  Pt has mammogram planned 3/8 and also dexa planned in June  No falls or fx Taking ca and D with mag and zinc  Colonoscopy utd Will look into tetanus shot at the pharmacy

## 2022-03-19 NOTE — Assessment & Plan Note (Signed)
Hypothyroidism  Pt has no clinical changes No change in energy level/ hair or skin/ edema and no tremor Lab Results  Component Value Date   TSH 3.07 03/12/2022    Plan to continue levothyroxine 150 mcg daily

## 2022-03-19 NOTE — Progress Notes (Signed)
Subjective:    Patient ID: Robin Gilmore, female    DOB: 11/23/1950, 72 y.o.   MRN: [id number]  HPI Here for health maintenance exam and to review chronic medical problems    Wt Readings from Last 3 Encounters:  03/19/22 196 lb 6 oz (89.1 kg)  03/19/22 199 lb (90.3 kg)  08/17/21 199 lb (90.3 kg)   31.70 kg/m  Doing ok  Getting outdoors   Vitals:   03/19/22 0951  BP: (!) 146/76  Pulse: 71  Temp: 97.6 F (36.4 C)  SpO2: 95%    Immunization History  Administered Date(s) Administered   Fluad Quad(high Dose 65+) 11/10/2018, 12/29/2019   Influenza Split 01/29/2011   Influenza Whole 11/21/2009   Influenza, High Dose Seasonal PF 10/24/2016, 10/29/2017   Influenza,inj,Quad PF,6+ Mos 11/18/2013   Influenza-Unspecified 11/12/2012, 10/24/2016, 11/17/2020, 12/22/2021   PFIZER(Purple Top)SARS-COV-2 Vaccination 03/15/2019, 04/08/2019   Pneumococcal Conjugate-13 08/09/2015   Pneumococcal Polysaccharide-23 08/15/2016   Td 11/15/2001   Tdap 01/29/2011   Zoster Recombinat (Shingrix) 03/25/2021, 05/30/2021   Zoster, Live 11/22/2012   Health Maintenance Due  Topic Date Due   DTaP/Tdap/Td (3 - Td or Tdap) 01/28/2021   MAMMOGRAM  02/03/2022   Tetanus shot : may get at pharmacy   Mammogram planned 03/23/22 Self breast exam: : no lumps   Colonoscopy 05/2021-no recall   Dexa  10/2015 at [facility] center normal Has one scheduled for June   Falls: none  Fractures:none  Supplements - taking ca plus D with mag and zinc  Exercise : some outdoor work   A lot of problems with back and hips -so walking is limited Cannot get on floor so does some chair yoga   HTN bp is not at goal No cp or palpitations or headaches or edema  No side effects to medicines  BP Readings from Last 3 Encounters:  03/19/22 (!) 146/76  08/17/21 (!) 150/80  06/15/21 (!) 171/87    Amlodipine 5 mg daily  Metoprolol xl 50 mg daily   Does not check at home Machine broke   Pulse Readings from Last 3  Encounters:  03/19/22 71  08/17/21 76  05/16/21 60      Hypothyroidism  Pt has no clinical changes No change in energy level/ hair or skin/ edema and no tremor Lab Results  Component Value Date   TSH 3.07 03/12/2022    Levothy 150 mcg daily  Glucose Lab Results  Component Value Date   HGBA1C 5.5 03/12/2022   Stable  Tries to eat healthy cut too many sweets Husb is diabetic so cooking better   Hyperlipidemia Lab Results  Component Value Date   CHOL 123 03/12/2022   CHOL 163 03/17/2021   CHOL 162 12/16/2019   Lab Results  Component Value Date   HDL 53.20 03/12/2022   HDL 57 03/17/2021   HDL 53.00 12/16/2019   Lab Results  Component Value Date   LDLCALC 43 03/12/2022   LDLCALC 79 03/17/2021   LDLCALC 79 12/16/2019   Lab Results  Component Value Date   TRIG 131.0 03/12/2022   TRIG 174 (H) 03/17/2021   TRIG 148.0 12/16/2019   Lab Results  Component Value Date   CHOLHDL 2 03/12/2022   CHOLHDL 2.9 03/17/2021   CHOLHDL 3 12/16/2019   Lab Results  Component Value Date   LDLDIRECT 90.0 11/03/2018   LDLDIRECT 111.0 10/29/2017   LDLDIRECT 82.0 08/15/2016   Crestor 10 mg daily  Feels like legs are weaker in general  Some pain    Lab Results  Component Value Date   CREATININE 0.61 03/12/2022   BUN 17 03/12/2022   NA 139 03/12/2022   K 4.0 03/12/2022   CL 101 03/12/2022   CO2 28 03/12/2022   Lab Results  Component Value Date   ALT 15 03/12/2022   AST 15 03/12/2022   ALKPHOS 57 03/12/2022   BILITOT 0.8 03/12/2022   Lab Results  Component Value Date   WBC 6.7 03/12/2022   HGB 14.2 03/12/2022   HCT 42.3 03/12/2022   MCV 85.9 03/12/2022   PLT 307.0 03/12/2022    Patient Active Problem List   Diagnosis Date Noted   Routine general medical examination at a health care facility 03/19/2021   Medicare annual wellness visit, subsequent 03/17/2021   Colon cancer screening 03/17/2021   Congenital coronary artery fistula 02/03/2021   Left leg  pain 12/29/2019   Coronary aneurysm 12/29/2019   Essential hypertension 11/10/2018   Elevated glucose level 11/10/2018   Mitral valve prolapse 11/04/2017   Estrogen deficiency 08/09/2015   Obesity 11/18/2013   Encounter for routine gynecological examination 06/30/2012   Encounter for general adult medical examination with abnormal findings 01/29/2011   Family history of colonic polyps 01/29/2011   Hypothyroidism 05/16/2009   Hyperlipidemia LDL goal <130 05/16/2009   CYSTOCELE WITHOUT MENTION UTERINE PROLAPSE LAT 05/16/2009   Past Medical History:  Diagnosis Date   Constipation    Coronary artery disease    Hyperlipidemia    Hypothyroid    IBS (irritable bowel syndrome)    Menopausal syndrome    Mitral prolapse and regurge used to have prophylaxis   Obesity    Past Surgical History:  Procedure Laterality Date   BREAST EXCISIONAL BIOPSY Right    BREAST SURGERY     breast biopsy x 2 benign   CESAREAN SECTION     COLONOSCOPY WITH PROPOFOL N/A 05/16/2021   Procedure: COLONOSCOPY WITH PROPOFOL;  Surgeon: Lucilla Lame, MD;  Location: [facility] ENDOSCOPY;  Service: Endoscopy;  Laterality: N/A;   retrovaginal fistula repair     Social History   Tobacco Use   Smoking status: Former   Smokeless tobacco: Never  Scientific laboratory technician Use: Never used  Substance Use Topics   Alcohol use: No    Alcohol/week: 0.0 standard drinks of alcohol   Drug use: No   Family History  Problem Relation Age of Onset   Alcohol abuse Mother    Diabetes Mother    Heart disease Mother        ? MI CAD   Cancer Father        lung CA   Alcohol abuse Father    Hypertension Sister    Hyperlipidemia Sister    Alcohol abuse Brother    Colon polyps Brother    Obesity Maternal Grandfather    Breast cancer Neg Hx    Allergies  Allergen Reactions   Codeine     REACTION: Severe nausea and vomitnig   Current Outpatient Medications on File Prior to Visit  Medication Sig Dispense Refill   aspirin EC 81 MG  tablet Take 81 mg by mouth daily. Swallow whole.     metoprolol succinate (TOPROL-XL) 50 MG 24 hr tablet Take 1 tablet (50 mg total) by mouth daily. Take with or immediately following a meal. 90 tablet 3   rosuvastatin (CRESTOR) 10 MG tablet Take 1 tablet (10 mg total) by mouth daily. 90 tablet 3   No current facility-administered medications  on file prior to visit.    Review of Systems  Constitutional:  Negative for activity change, appetite change, fatigue, fever and unexpected weight change.  HENT:  Negative for congestion, ear pain, rhinorrhea, sinus pressure and sore throat.   Eyes:  Negative for pain, redness and visual disturbance.  Respiratory:  Negative for cough, shortness of breath and wheezing.   Cardiovascular:  Negative for chest pain and palpitations.  Gastrointestinal:  Negative for abdominal pain, blood in stool, constipation and diarrhea.  Endocrine: Negative for polydipsia and polyuria.  Genitourinary:  Negative for dysuria, frequency and urgency.  Musculoskeletal:  Negative for arthralgias, back pain and myalgias.  Skin:  Negative for pallor and rash.  Allergic/Immunologic: Negative for environmental allergies.  Neurological:  Negative for dizziness, syncope and headaches.       Baseline L back and leg pain    Feels like legs are generally weaker than they used to be  Hematological:  Negative for adenopathy. Does not bruise/bleed easily.  Psychiatric/Behavioral:  Negative for decreased concentration and dysphoric mood. The patient is not nervous/anxious.        Objective:   Physical Exam Constitutional:      General: She is not in acute distress.    Appearance: Normal appearance. She is well-developed. She is obese. She is not ill-appearing or diaphoretic.  HENT:     Head: Normocephalic and atraumatic.     Right Ear: Tympanic membrane, ear canal and external ear normal.     Left Ear: Tympanic membrane, ear canal and external ear normal.     Nose: Nose normal.  No congestion.     Mouth/Throat:     Mouth: Mucous membranes are moist.     Pharynx: Oropharynx is clear. No posterior oropharyngeal erythema.  Eyes:     General: No scleral icterus.    Extraocular Movements: Extraocular movements intact.     Conjunctiva/sclera: Conjunctivae normal.     Pupils: Pupils are equal, round, and reactive to light.  Neck:     Thyroid: No thyromegaly.     Vascular: No carotid bruit or JVD.  Cardiovascular:     Rate and Rhythm: Normal rate and regular rhythm.     Pulses: Normal pulses.     Heart sounds: Normal heart sounds.     No gallop.  Pulmonary:     Effort: Pulmonary effort is normal. No respiratory distress.     Breath sounds: Normal breath sounds. No wheezing.     Comments: Good air exch Chest:     Chest wall: No tenderness.  Abdominal:     General: Bowel sounds are normal. There is no distension or abdominal bruit.     Palpations: Abdomen is soft. There is no mass.     Tenderness: There is no abdominal tenderness.     Hernia: No hernia is present.  Genitourinary:    Comments: Breast exam: No mass, nodules, thickening, tenderness, bulging, retraction, inflamation, nipple discharge or skin changes noted.  No axillary or clavicular LA.     Musculoskeletal:        General: No tenderness. Normal range of motion.     Cervical back: Normal range of motion and neck supple. No rigidity. No muscular tenderness.     Right lower leg: No edema.     Left lower leg: No edema.     Comments: No kyphosis   Lymphadenopathy:     Cervical: No cervical adenopathy.  Skin:    General: Skin is warm and dry.  Coloration: Skin is not pale.     Findings: No erythema or rash.     Comments: Solar lentigines diffusely Some sks and angiomas  Some mild bruises on arms from asa and thin skin  Neurological:     Mental Status: She is alert. Mental status is at baseline.     Cranial Nerves: No cranial nerve deficit.     Motor: No abnormal muscle tone.      Coordination: Coordination normal.     Gait: Gait normal.     Deep Tendon Reflexes: Reflexes are normal and symmetric. Reflexes normal.  Psychiatric:        Mood and Affect: Mood normal.        Cognition and Memory: Cognition and memory normal.           Assessment & Plan:   Problem List Items Addressed This Visit       Cardiovascular and Mediastinum   Essential hypertension    BP: (!) 151/70  Not at goal Will inc amlodipine to 10 mg daily as tolerated Continue metoprolol xl 50 mg daily from cardiology   Continue cards care  F/u here 4-6 wk Recommend she get a bp cuff      Relevant Medications   amLODipine (NORVASC) 10 MG tablet     Endocrine   Hypothyroidism    Hypothyroidism  Pt has no clinical changes No change in energy level/ hair or skin/ edema and no tremor Lab Results  Component Value Date   TSH 3.07 03/12/2022    Plan to continue levothyroxine 150 mcg daily       Relevant Medications   levothyroxine (SYNTHROID) 150 MCG tablet     Other   Elevated glucose level    Lab Results  Component Value Date   HGBA1C 5.5 03/12/2022  Stable disc imp of low glycemic diet and wt loss to prevent DM2  Enc some strength training as tolerated       Estrogen deficiency   Hyperlipidemia LDL goal <130    Disc goals for lipids and reasons to control them Rev last labs with pt Rev low sat fat diet in detail  Taking crestor 10 mg with good control Followed by cardiology  ? If this is causing leg pain and ? Weakness She plans to reach out to the prescriber       Relevant Medications   amLODipine (NORVASC) 10 MG tablet   Obesity    Discussed how this problem influences overall health and the risks it imposes  Reviewed plan for weight loss with lower calorie diet (via better food choices and also portion control or program like weight watchers) and exercise building up to or more than 30 minutes 5 days per week including some aerobic activity    Chair yoga  may be a good choice Disc options for strentth training       Routine general medical examination at a health care facility - Primary    Reviewed health habits including diet and exercise and skin cancer prevention Reviewed appropriate screening tests for age  Also reviewed health mt list, fam hx and immunization status , as well as social and family history   See HPI Labs reviewed  Pt has mammogram planned 3/8 and also dexa planned in June  No falls or fx Taking ca and D with mag and zinc  Colonoscopy utd Will look into tetanus shot at the pharmacy

## 2022-03-19 NOTE — Addendum Note (Signed)
Addended by: Loura Pardon A on: 03/19/2022 07:54 PM   Modules accepted: Level of Service

## 2022-03-19 NOTE — Assessment & Plan Note (Signed)
BP: (!) 151/70  Not at goal Will inc amlodipine to 10 mg daily as tolerated Continue metoprolol xl 50 mg daily from cardiology   Continue cards care  F/u here 4-6 wk Recommend she get a bp cuff

## 2022-03-19 NOTE — Assessment & Plan Note (Addendum)
Discussed how this problem influences overall health and the risks it imposes  Reviewed plan for weight loss with lower calorie diet (via better food choices and also portion control or program like weight watchers) and exercise building up to or more than 30 minutes 5 days per week including some aerobic activity    Chair yoga may be a good choice Disc options for strentth training

## 2022-03-19 NOTE — Patient Instructions (Addendum)
Robin Gilmore , Thank you for taking time to come for your Medicare Wellness Visit. I appreciate your ongoing commitment to your health goals. Please review the following plan we discussed and let me know if I can assist you in the future.   These are the goals we discussed:     Goals Addressed               This Visit's Progress     Patient Stated     DIET - REDUCE SUGAR INTAKE (pt-stated)        Lower glucose level Stay hydrated       This is a list of the screening recommended for you and due dates:  Health Maintenance  Topic Date Due   DTaP/Tdap/Td vaccine (3 - Td or Tdap) 01/28/2021   Mammogram  02/03/2022   COVID-19 Vaccine (3 - 2023-24 season) 04/04/2022*   Medicare Annual Wellness Visit  03/19/2023   Colon Cancer Screening  05/17/2031   Pneumonia Vaccine  Completed   Flu Shot  Completed   DEXA scan (bone density measurement)  Completed   Hepatitis C Screening: USPSTF Recommendation to screen - Ages 82-79 yo.  Completed   Zoster (Shingles) Vaccine  Completed   HPV Vaccine  Aged Out  *Topic was postponed. The date shown is not the original due date.    Advanced directives: not yet completed.   Conditions/risks identified: none new.  Next appointment: Follow up in one year for your annual wellness visit    Preventive Care 65 Years and Older, Female Preventive care refers to lifestyle choices and visits with your health care provider that can promote health and wellness. What does preventive care include? A yearly physical exam. This is also called an annual well check. Dental exams once or twice a year. Routine eye exams. Ask your health care provider how often you should have your eyes checked. Personal lifestyle choices, including: Daily care of your teeth and gums. Regular physical activity. Eating a healthy diet. Avoiding tobacco and drug use. Limiting alcohol use. Practicing safe sex. Taking low-dose aspirin every day. Taking vitamin and mineral  supplements as recommended by your health care provider. What happens during an annual well check? The services and screenings done by your health care provider during your annual well check will depend on your age, overall health, lifestyle risk factors, and family history of disease. Counseling  Your health care provider may ask you questions about your: Alcohol use. Tobacco use. Drug use. Emotional well-being. Home and relationship well-being. Sexual activity. Eating habits. History of falls. Memory and ability to understand (cognition). Work and work Statistician. Reproductive health. Screening  You may have the following tests or measurements: Height, weight, and BMI. Blood pressure. Lipid and cholesterol levels. These may be checked every 5 years, or more frequently if you are over 72 years old. Skin check. Lung cancer screening. You may have this screening every year starting at age 51 if you have a 30-pack-year history of smoking and currently smoke or have quit within the past 15 years. Fecal occult blood test (FOBT) of the stool. You may have this test every year starting at age 58. Flexible sigmoidoscopy or colonoscopy. You may have a sigmoidoscopy every 5 years or a colonoscopy every 10 years starting at age 71. Hepatitis C blood test. Hepatitis B blood test. Sexually transmitted disease (STD) testing. Diabetes screening. This is done by checking your blood sugar (glucose) after you have not eaten for a while (fasting). You  may have this done every 1-3 years. Bone density scan. This is done to screen for osteoporosis. You may have this done starting at age 41. Mammogram. This may be done every 1-2 years. Talk to your health care provider about how often you should have regular mammograms. Talk with your health care provider about your test results, treatment options, and if necessary, the need for more tests. Vaccines  Your health care provider may recommend certain  vaccines, such as: Influenza vaccine. This is recommended every year. Tetanus, diphtheria, and acellular pertussis (Tdap, Td) vaccine. You may need a Td booster every 10 years. Zoster vaccine. You may need this after age 71. Pneumococcal 13-valent conjugate (PCV13) vaccine. One dose is recommended after age 64. Pneumococcal polysaccharide (PPSV23) vaccine. One dose is recommended after age 1. Talk to your health care provider about which screenings and vaccines you need and how often you need them. This information is not intended to replace advice given to you by your health care provider. Make sure you discuss any questions you have with your health care provider. Document Released: 01/28/2015 Document Revised: 09/21/2015 Document Reviewed: 11/02/2014 Elsevier Interactive Patient Education  2017 [place] Prevention in the Home Falls can cause injuries. They can happen to people of all ages. There are many things you can do to make your home safe and to help prevent falls. What can I do on the outside of my home? Regularly fix the edges of walkways and driveways and fix any cracks. Remove anything that might make you trip as you walk through a door, such as a raised step or threshold. Trim any bushes or trees on the path to your home. Use bright outdoor lighting. Clear any walking paths of anything that might make someone trip, such as rocks or tools. Regularly check to see if handrails are loose or broken. Make sure that both sides of any steps have handrails. Any raised decks and porches should have guardrails on the edges. Have any leaves, snow, or ice cleared regularly. Use sand or salt on walking paths during winter. Clean up any spills in your garage right away. This includes oil or grease spills. What can I do in the bathroom? Use night lights. Install grab bars by the toilet and in the tub and shower. Do not use towel bars as grab bars. Use non-skid mats or decals in  the tub or shower. If you need to sit down in the shower, use a plastic, non-slip stool. Keep the floor dry. Clean up any water that spills on the floor as soon as it happens. Remove soap buildup in the tub or shower regularly. Attach bath mats securely with double-sided non-slip rug tape. Do not have throw rugs and other things on the floor that can make you trip. What can I do in the bedroom? Use night lights. Make sure that you have a light by your bed that is easy to reach. Do not use any sheets or blankets that are too big for your bed. They should not hang down onto the floor. Have a firm chair that has side arms. You can use this for support while you get dressed. Do not have throw rugs and other things on the floor that can make you trip. What can I do in the kitchen? Clean up any spills right away. Avoid walking on wet floors. Keep items that you use a lot in easy-to-reach places. If you need to reach something above you, use a strong  step stool that has a grab bar. Keep electrical cords out of the way. Do not use floor polish or wax that makes floors slippery. If you must use wax, use non-skid floor wax. Do not have throw rugs and other things on the floor that can make you trip. What can I do with my stairs? Do not leave any items on the stairs. Make sure that there are handrails on both sides of the stairs and use them. Fix handrails that are broken or loose. Make sure that handrails are as long as the stairways. Check any carpeting to make sure that it is firmly attached to the stairs. Fix any carpet that is loose or worn. Avoid having throw rugs at the top or bottom of the stairs. If you do have throw rugs, attach them to the floor with carpet tape. Make sure that you have a light switch at the top of the stairs and the bottom of the stairs. If you do not have them, ask someone to add them for you. What else can I do to help prevent falls? Wear shoes that: Do not have high  heels. Have rubber bottoms. Are comfortable and fit you well. Are closed at the toe. Do not wear sandals. If you use a stepladder: Make sure that it is fully opened. Do not climb a closed stepladder. Make sure that both sides of the stepladder are locked into place. Ask someone to hold it for you, if possible. Clearly mark and make sure that you can see: Any grab bars or handrails. First and last steps. Where the edge of each step is. Use tools that help you move around (mobility aids) if they are needed. These include: Canes. Walkers. Scooters. Crutches. Turn on the lights when you go into a dark area. Replace any light bulbs as soon as they burn out. Set up your furniture so you have a clear path. Avoid moving your furniture around. If any of your floors are uneven, fix them. If there are any pets around you, be aware of where they are. Review your medicines with your doctor. Some medicines can make you feel dizzy. This can increase your chance of falling. Ask your doctor what other things that you can do to help prevent falls. This information is not intended to replace advice given to you by your health care provider. Make sure you discuss any questions you have with your health care provider. Document Released: 10/28/2008 Document Revised: 06/09/2015 Document Reviewed: 02/05/2014 Elsevier Interactive Patient Education  2017 [employer].

## 2022-03-19 NOTE — Assessment & Plan Note (Signed)
Lab Results  Component Value Date   HGBA1C 5.5 03/12/2022   Stable disc imp of low glycemic diet and wt loss to prevent DM2  Enc some strength training as tolerated

## 2022-03-19 NOTE — Assessment & Plan Note (Signed)
Disc goals for lipids and reasons to control them Rev last labs with pt Rev low sat fat diet in detail  Taking crestor 10 mg with good control Followed by cardiology  ? If this is causing leg pain and ? Weakness She plans to reach out to the prescriber

## 2022-03-19 NOTE — Progress Notes (Signed)
Subjective:   Robin Gilmore is a 72 y.o. female who presents for Medicare Annual (Subsequent) preventive examination.  Review of Systems    No ROS.  Medicare Wellness Virtual Visit.  Visual/audio telehealth visit, UTA vital signs.   See social history for additional risk factors.   Cardiac Risk Factors include: advanced age (>12mn, >>59women)     Objective:    Today's Vitals   03/19/22 0835  Weight: 199 lb (90.3 kg)  Height: '5\' 7"'$  (1.702 m)   Body mass index is 31.17 kg/m.     03/19/2022    8:45 AM 05/16/2021    8:25 AM 12/16/2019   11:30 AM 11/03/2018    3:40 PM 10/29/2017   12:28 PM 08/23/2015   10:32 AM  Advanced Directives  Does Patient Have a Medical Advance Directive? No No No No No No  Would patient like information on creating a medical advance directive? No - Patient declined  No - Patient declined No - Patient declined No - Patient declined Yes - Educational materials given    Current Medications (verified) Outpatient Encounter Medications as of 03/19/2022  Medication Sig   amLODipine (NORVASC) 5 MG tablet Take 1 tablet (5 mg total) by mouth daily.   aspirin EC 81 MG tablet Take 81 mg by mouth daily. Swallow whole.   gabapentin (NEURONTIN) 300 MG capsule TAKE 1 CAPSULE BY MOUTH ONCE DAILY FOR LEG PAIN   levothyroxine (SYNTHROID) 150 MCG tablet Take 1 tablet (150 mcg total) by mouth daily before breakfast.   metoprolol succinate (TOPROL-XL) 50 MG 24 hr tablet Take 1 tablet (50 mg total) by mouth daily. Take with or immediately following a meal.   rosuvastatin (CRESTOR) 10 MG tablet Take 1 tablet (10 mg total) by mouth daily.   No facility-administered encounter medications on file as of 03/19/2022.    Allergies (verified) Codeine   History: Past Medical History:  Diagnosis Date   Constipation    Coronary artery disease    Hyperlipidemia    Hypothyroid    IBS (irritable bowel syndrome)    Menopausal syndrome    Mitral prolapse and regurge used to have  prophylaxis   Obesity    Past Surgical History:  Procedure Laterality Date   BREAST EXCISIONAL BIOPSY Right    BREAST SURGERY     breast biopsy x 2 benign   CESAREAN SECTION     COLONOSCOPY WITH PROPOFOL N/A 05/16/2021   Procedure: COLONOSCOPY WITH PROPOFOL;  Surgeon: WLucilla Lame MD;  Location: [facility] ENDOSCOPY;  Service: Endoscopy;  Laterality: N/A;   retrovaginal fistula repair     Family History  Problem Relation Age of Onset   Alcohol abuse Mother    Diabetes Mother    Heart disease Mother        ? MI CAD   Cancer Father        lung CA   Alcohol abuse Father    Hypertension Sister    Hyperlipidemia Sister    Alcohol abuse Brother    Colon polyps Brother    Obesity Maternal Grandfather    Breast cancer Neg Hx    Social History   Socioeconomic History   Marital status: Married    Spouse name: Not on file   Number of children: Not on file   Years of education: Not on file   Highest education level: Not on file  Occupational History   Not on file  Tobacco Use   Smoking status: Former   Smokeless  tobacco: Never  Vaping Use   Vaping Use: Never used  Substance and Sexual Activity   Alcohol use: No    Alcohol/week: 0.0 standard drinks of alcohol   Drug use: No   Sexual activity: Never  Other Topics Concern   Not on file  Social History Narrative   Not on file   Social Determinants of Health   Financial Resource Strain: Low Risk  (03/19/2022)   Overall Financial Resource Strain (CARDIA)    Difficulty of Paying Living Expenses: Not hard at all  Food Insecurity: No Food Insecurity (03/19/2022)   Hunger Vital Sign    Worried About Running Out of Food in the Last Year: Never true    Ran Out of Food in the Last Year: Never true  Transportation Needs: No Transportation Needs (03/19/2022)   PRAPARE - Hydrologist (Medical): No    Lack of Transportation (Non-Medical): No  Physical Activity: Sufficiently Active (03/19/2022)   Exercise Vital Sign     Days of Exercise per Week: 5 days    Minutes of Exercise per Session: 30 min  Stress: No Stress Concern Present (03/19/2022)   [place]    Feeling of Stress : Not at all  Social Connections: Unknown (03/19/2022)   Social Connection and Isolation Panel [NHANES]    Frequency of Communication with Friends and Family: Not on file    Frequency of Social Gatherings with Friends and Family: Not on file    Attends Religious Services: Not on file    Active Member of Clubs or Organizations: Not on file    Attends Archivist Meetings: Not on file    Marital Status: Married    Tobacco Counseling Counseling given: Not Answered   Clinical Intake:  Pre-visit preparation completed: Yes        Diabetes: No  How often do you need to have someone help you when you read instructions, pamphlets, or other written materials from your doctor or pharmacy?: 1 - Never    Interpreter Needed?: No      Activities of Daily Living    03/19/2022    8:37 AM  In your present state of health, do you have any difficulty performing the following activities:  Hearing? 0  Vision? 0  Difficulty concentrating or making decisions? 0  Walking or climbing stairs? 0  Dressing or bathing? 0  Doing errands, shopping? 0  Preparing Food and eating ? N  Using the Toilet? N  In the past six months, have you accidently leaked urine? N  Do you have problems with loss of bowel control? N  Managing your Medications? N  Managing your Finances? N  Housekeeping or managing your Housekeeping? N    Patient Care Team: Tower, Wynelle Fanny, MD as PCP - General Marlou Porch Thana Farr, MD as PCP - Cardiology (Cardiology)  Indicate any recent Medical Services you may have received from other than [facility] providers in the past year (date may be approximate).     Assessment:   This is a routine wellness examination for Cleone.  I connected with  Jaquita Rector on 03/19/22 by a audio enabled telemedicine application and verified that I am speaking with the correct person using two identifiers.  Patient Location: Home  Provider Location: Office/Clinic  I discussed the limitations of evaluation and management by telemedicine. The patient expressed understanding and agreed to proceed.   Hearing/Vision screen Hearing Screening - Comments::  Patient is able to hear conversational tones without difficulty.  No issues reported.   Vision Screening - Comments:: Cataract extraction, bilateral They have seen their ophthalmologist in the last 12 months.    Dietary issues and exercise activities discussed: Current Exercise Habits: Home exercise routine, Type of exercise: walking (Sitting/standing exercises), Time (Minutes): 30, Frequency (Times/Week): 5, Weekly Exercise (Minutes/Week): 150, Intensity: Mild   Goals Addressed               This Visit's Progress     Patient Stated     DIET - REDUCE SUGAR INTAKE (pt-stated)        Lower glucose level Stay hydrated       Depression Screen    03/19/2022    8:41 AM 03/19/2021    2:41 AM 12/16/2019   11:33 AM 11/03/2018    3:40 PM 10/29/2017   12:27 PM 08/15/2016    3:36 PM 08/23/2015   10:33 AM  PHQ 2/9 Scores  PHQ - 2 Score 0 0 0 0 0 0 0  PHQ- 9 Score   0 0 0 3     Fall Risk    03/19/2022    8:41 AM 12/16/2019   11:32 AM 11/03/2018    3:40 PM 10/29/2017   12:27 PM 08/23/2015   10:33 AM  Fall Risk   Falls in the past year? 0 0 0 No Yes  Comment     fell over pet on floor  Number falls in past yr: 0 0 0  1  Injury with Fall? 0 0 0  No  Risk for fall due to :  Medication side effect Medication side effect    Follow up Falls evaluation completed;Falls prevention discussed Falls evaluation completed;Falls prevention discussed Falls evaluation completed;Falls prevention discussed  Falls evaluation completed    FALL RISK PREVENTION PERTAINING TO THE HOME: Home free of loose throw rugs in  walkways, pet beds, electrical cords, etc? Yes  Adequate lighting in your home to reduce risk of falls? Yes   ASSISTIVE DEVICES UTILIZED TO PREVENT FALLS: Life alert? No  Use of a cane, walker or w/c? No  Grab bars in the bathroom? Yes  Shower chair or bench in shower? Yes  Elevated toilet seat or a handicapped toilet? Yes   TIMED UP AND GO: Was the test performed? No .   Cognitive Function:    12/16/2019   11:35 AM 11/03/2018    3:42 PM 10/29/2017   12:27 PM 08/23/2015   10:36 AM  MMSE - Mini Mental State Exam  Orientation to time '5 5 5 5  '$ Orientation to Place '5 5 5 5  '$ Registration '3 3 3 3  '$ Attention/ Calculation 5 5 0 0  Recall '3 3 3 3  '$ Language- name 2 objects   0 0  Language- repeat '1 1 1 1  '$ Language- follow 3 step command   3 3  Language- read & follow direction   0 0  Write a sentence   0 0  Copy design   0 0  Total score   20 20        03/19/2022    8:40 AM  6CIT Screen  What Year? 0 points  What month? 0 points  What time? 0 points  Count back from 20 0 points  Months in reverse 0 points  Repeat phrase 0 points  Total Score 0 points    Immunizations Immunization History  Administered Date(s) Administered   Fluad Quad(high Dose  65+) 11/10/2018, 12/29/2019   Influenza Split 01/29/2011   Influenza Whole 11/21/2009   Influenza, High Dose Seasonal PF 10/24/2016, 10/29/2017   Influenza,inj,Quad PF,6+ Mos 11/18/2013   Influenza-Unspecified 11/12/2012, 10/24/2016, 11/17/2020, 12/22/2021   PFIZER(Purple Top)SARS-COV-2 Vaccination 03/15/2019, 04/08/2019   Pneumococcal Conjugate-13 08/09/2015   Pneumococcal Polysaccharide-23 08/15/2016   Td 11/15/2001   Tdap 01/29/2011   Zoster Recombinat (Shingrix) 03/25/2021, 05/30/2021   Zoster, Live 11/22/2012    TDAP status: Due, Education has been provided regarding the importance of this vaccine. Advised may receive this vaccine at local pharmacy or Health Dept. Aware to provide a copy of the vaccination record if  obtained from local pharmacy or Health Dept. Verbalized acceptance and understanding.  Screening Tests Health Maintenance  Topic Date Due   DTaP/Tdap/Td (3 - Td or Tdap) 01/28/2021   MAMMOGRAM  02/03/2022   COVID-19 Vaccine (3 - 2023-24 season) 04/04/2022 (Originally 09/15/2021)   Medicare Annual Wellness (AWV)  03/19/2023   COLONOSCOPY (Pts 45-73yr Insurance coverage will need to be confirmed)  05/17/2031   Pneumonia Vaccine 72 Years old  Completed   INFLUENZA VACCINE  Completed   DEXA SCAN  Completed   Hepatitis C Screening  Completed   Zoster Vaccines- Shingrix  Completed   HPV VACCINES  Aged Out   Health Maintenance Health Maintenance Due  Topic Date Due   DTaP/Tdap/Td (3 - Td or Tdap) 01/28/2021   MAMMOGRAM  02/03/2022   Mammogram- scheduled 03/23/22.   Lung Cancer Screening: (Low Dose CT Chest recommended if Age 73-80years, 30 pack-year currently smoking OR have quit w/in 15years.) does not qualify.   Hepatitis C Screening: Completed 10/2017.   Vision Screening: Recommended annual ophthalmology exams for early detection of glaucoma and other disorders of the eye.  Dental Screening: Recommended annual dental exams for proper oral hygiene.  Community Resource Referral / Chronic Care Management: CRR required this visit?  No   CCM required this visit?  No      Plan:     I have personally reviewed and noted the following in the patient's chart:   Medical and social history Use of alcohol, tobacco or illicit drugs  Current medications and supplements including opioid prescriptions. Patient is not currently taking opioid prescriptions. Functional ability and status Nutritional status Physical activity Advanced directives List of other physicians Hospitalizations, surgeries, and ER visits in previous 12 months Vitals Screenings to include cognitive, depression, and falls Referrals and appointments  In addition, I have reviewed and discussed with patient certain  preventive protocols, quality metrics, and best practice recommendations. A written personalized care plan for preventive services as well as general preventive health recommendations were provided to patient.     DLeta Jungling LPN   [id number]

## 2022-03-23 ENCOUNTER — Ambulatory Visit
Admission: RE | Admit: 2022-03-23 | Discharge: 2022-03-23 | Disposition: A | Discharge status: Home / Self Care | Payer: PPO | Source: Ambulatory Visit | Attending: Family Medicine | Admitting: Family Medicine

## 2022-03-23 DIAGNOSIS — Z1231 Encounter for screening mammogram for malignant neoplasm of breast: Secondary | ICD-10-CM | POA: Diagnosis not present

## 2022-03-28 ENCOUNTER — Other Ambulatory Visit: Payer: Self-pay | Admitting: Family Medicine

## 2022-03-28 DIAGNOSIS — R928 Other abnormal and inconclusive findings on diagnostic imaging of breast: Secondary | ICD-10-CM

## 2022-04-02 ENCOUNTER — Ambulatory Visit
Admission: RE | Admit: 2022-04-02 | Discharge: 2022-04-02 | Disposition: A | Discharge status: Home / Self Care | Payer: PPO | Source: Ambulatory Visit | Attending: Family Medicine | Admitting: Family Medicine

## 2022-04-02 ENCOUNTER — Other Ambulatory Visit: Payer: Self-pay | Admitting: Family Medicine

## 2022-04-02 DIAGNOSIS — R921 Mammographic calcification found on diagnostic imaging of breast: Secondary | ICD-10-CM

## 2022-04-02 DIAGNOSIS — R928 Other abnormal and inconclusive findings on diagnostic imaging of breast: Secondary | ICD-10-CM

## 2022-04-03 DIAGNOSIS — M25552 Pain in left hip: Secondary | ICD-10-CM | POA: Diagnosis not present

## 2022-04-03 DIAGNOSIS — M25551 Pain in right hip: Secondary | ICD-10-CM | POA: Diagnosis not present

## 2022-04-04 ENCOUNTER — Telehealth: Payer: Self-pay

## 2022-04-04 ENCOUNTER — Encounter: Payer: Self-pay | Admitting: Cardiology

## 2022-04-04 DIAGNOSIS — I2541 Coronary artery aneurysm: Secondary | ICD-10-CM

## 2022-04-04 NOTE — Telephone Encounter (Signed)
Attempted to contact patient about concerns with upcoming breast biopsy. No answer, left message to call our office back.

## 2022-04-04 NOTE — Telephone Encounter (Signed)
Patient returned call

## 2022-04-04 NOTE — Telephone Encounter (Signed)
Spoke with patient about breast biopsy concerns, no further questions at this time.

## 2022-04-05 NOTE — Telephone Encounter (Signed)
Please see the MyChart message reply(ies) for my assessment and plan.    I was able to review the mammogram and the calcifications seen are in your breast tissue, not your heart. The surgeon doing the biopsy will not be close to the heart or your coronary artery aneurysms. They will not be in danger of rupture from this procedure.   You are in good hands.   Sincerely,  Candee Furbish, MD    This patient gave consent for this Medical Advice Message and is aware that it may result in a bill to their insurance company, as well as the possibility of receiving a bill for a co-payment or deductible. They are an established patient, but are not seeking medical advice exclusively about a problem treated during an in person or video visit in the last seven days. I did not recommend an in person or video visit within seven days of my reply.    I spent a total of 5 minutes cumulative time within 7 days through [employer].  Candee Furbish, MD

## 2022-04-16 ENCOUNTER — Other Ambulatory Visit: Payer: Self-pay | Admitting: Family Medicine

## 2022-04-18 ENCOUNTER — Ambulatory Visit
Admission: RE | Admit: 2022-04-18 | Discharge: 2022-04-18 | Disposition: A | Discharge status: Home / Self Care | Payer: PPO | Source: Ambulatory Visit | Attending: Family Medicine | Admitting: Family Medicine

## 2022-04-18 DIAGNOSIS — R921 Mammographic calcification found on diagnostic imaging of breast: Secondary | ICD-10-CM

## 2022-04-18 HISTORY — PX: BREAST BIOPSY: SHX20

## 2022-04-23 ENCOUNTER — Ambulatory Visit (INDEPENDENT_AMBULATORY_CARE_PROVIDER_SITE_OTHER): Payer: PPO | Admitting: Family Medicine

## 2022-04-23 ENCOUNTER — Encounter: Payer: Self-pay | Admitting: Family Medicine

## 2022-04-23 VITALS — BP 131/70 | HR 69 | Temp 97.2°F | Ht 66.0 in | Wt 198.1 lb

## 2022-04-23 DIAGNOSIS — I10 Essential (primary) hypertension: Secondary | ICD-10-CM | POA: Diagnosis not present

## 2022-04-23 NOTE — Progress Notes (Signed)
Subjective:    Patient ID: Robin Gilmore, female    DOB: [birth year], 72 y.o.   MRN: [id number]  HPI  Presents for f/u of HTN  Wt Readings from Last 3 Encounters:  04/23/22 198 lb 2 oz (89.9 kg)  03/19/22 196 lb 6 oz (89.1 kg)  03/19/22 199 lb (90.3 kg)   31.98 kg/m  Vitals:   04/23/22 0840  BP: (!) 146/88  Pulse: 69  Temp: (!) 97.2 F (36.2 C)  SpO2: 97%    Had to have a breast bx for micro calcifications  Came out b9  Overall had a good experience    Last visit bp was up at 151/70 We inc amlodipine to 10 mg daily  Takes metoprolol xl 50 mg daily   (from cardiology)  BP Readings from Last 3 Encounters:  04/23/22 131/70  03/19/22 (!) 151/70  08/17/21 (!) 150/80    Pulse Readings from Last 3 Encounters:  04/23/22 69  03/19/22 71  08/17/21 76   Bp was 125/79 before she came her today  Has been very good at home   She has h/o coronary art fistula  Also mvp  Hypothyroidism  Pt has no clinical changes No change in energy level/ hair or skin/ edema and no tremor Lab Results  Component Value Date   TSH 3.07 03/12/2022     Trying to do better with lifestyle change Using some weights and exercise bands and has a stepper  Ttrying to get her legs stronger  Would like to walk more   No pain but feels like her legs are just weaker  Since retirement less active overall   Chronic back pain does limit her   Traveled/ not great diet  Now back to normal   Some ankle swelling at end of the day       03/19/2022   10:03 AM 03/19/2022    8:41 AM 03/19/2021    2:41 AM 12/16/2019   11:33 AM 11/03/2018    3:40 PM  Depression screen PHQ 2/9  Decreased Interest 0 0 0 0 0  Down, Depressed, Hopeless 0 0 0 0 0  PHQ - 2 Score 0 0 0 0 0  Altered sleeping 0   0 0  Tired, decreased energy 0   0 0  Change in appetite 0   0 0  Feeling bad or failure about yourself  0   0 0  Trouble concentrating 0   0 0  Moving slowly or fidgety/restless 0   0 0  Suicidal  thoughts 0   0 0  PHQ-9 Score 0   0 0  Difficult doing work/chores Not difficult at all   Not difficult at all Not difficult at all      03/19/2022   10:03 AM  GAD 7 : Generalized Anxiety Score  Nervous, Anxious, on Edge 0  Control/stop worrying 0  Worry too much - different things 0  Trouble relaxing 0  Restless 0  Easily annoyed or irritable 0  Afraid - awful might happen 0  Total GAD 7 Score 0  Anxiety Difficulty Not difficult at all     Seems tense/more anxious with age    Patient Active Problem List   Diagnosis Date Noted   Routine general medical examination at a health care facility 03/19/2021   Medicare annual wellness visit, subsequent 03/17/2021   Colon cancer screening 03/17/2021   Congenital coronary artery fistula 02/03/2021   Left leg pain 12/29/2019  Coronary aneurysm 12/29/2019   Essential hypertension 11/10/2018   Elevated glucose level 11/10/2018   Mitral valve prolapse 11/04/2017   Estrogen deficiency 08/09/2015   Obesity 11/18/2013   Encounter for routine gynecological examination 06/30/2012   Encounter for general adult medical examination with abnormal findings 01/29/2011   Family history of colonic polyps 01/29/2011   Hypothyroidism 05/16/2009   Hyperlipidemia LDL goal <130 05/16/2009   CYSTOCELE WITHOUT MENTION UTERINE PROLAPSE LAT 05/16/2009   Past Medical History:  Diagnosis Date   Constipation    Coronary artery disease    Hyperlipidemia    Hypothyroid    IBS (irritable bowel syndrome)    Menopausal syndrome    Mitral prolapse and regurge used to have prophylaxis   Obesity    Past Surgical History:  Procedure Laterality Date   BREAST BIOPSY Right 04/18/2022   MM RT BREAST BX W LOC DEV 1ST LESION IMAGE BX SPEC STEREO GUIDE 04/18/2022 [facility]   BREAST EXCISIONAL BIOPSY Right    BREAST SURGERY     breast biopsy x 2 benign   CESAREAN SECTION     COLONOSCOPY WITH PROPOFOL N/A 05/16/2021   Procedure: COLONOSCOPY WITH PROPOFOL;   Surgeon: Midge MiniumWohl, Darren, MD;  Location: [facility] ENDOSCOPY;  Service: Endoscopy;  Laterality: N/A;   retrovaginal fistula repair     Social History   Tobacco Use   Smoking status: Former   Smokeless tobacco: Never  Building services engineerVaping Use   Vaping Use: Never used  Substance Use Topics   Alcohol use: No    Alcohol/week: 0.0 standard drinks of alcohol   Drug use: No   Family History  Problem Relation Age of Onset   Alcohol abuse Mother    Diabetes Mother    Heart disease Mother        ? MI CAD   Cancer Father        lung CA   Alcohol abuse Father    Hypertension Sister    Hyperlipidemia Sister    Alcohol abuse Brother    Colon polyps Brother    Obesity Maternal Grandfather    Breast cancer Neg Hx    Allergies  Allergen Reactions   Codeine     REACTION: Severe nausea and vomitnig   Current Outpatient Medications on File Prior to Visit  Medication Sig Dispense Refill   amLODipine (NORVASC) 10 MG tablet Take 1 tablet (10 mg total) by mouth daily. 90 tablet 3   aspirin EC 81 MG tablet Take 81 mg by mouth daily. Swallow whole.     calcium-vitamin D (OSCAL WITH D) 500-5 MG-MCG tablet Take 1 tablet by mouth. Pt states this has zinc and mag     gabapentin (NEURONTIN) 300 MG capsule TAKE 1 CAPSULE BY MOUTH ONCE DAILY FOR LEG PAIN 90 capsule 3   levothyroxine (SYNTHROID) 150 MCG tablet Take 1 tablet by mouth daily before breakfast 90 tablet 2   metoprolol succinate (TOPROL-XL) 50 MG 24 hr tablet Take 1 tablet (50 mg total) by mouth daily. Take with or immediately following a meal. 90 tablet 3   rosuvastatin (CRESTOR) 10 MG tablet Take 1 tablet (10 mg total) by mouth daily. 90 tablet 3   No current facility-administered medications on file prior to visit.    Review of Systems  Constitutional:  Negative for activity change, appetite change, fatigue, fever and unexpected weight change.  HENT:  Negative for congestion, ear pain, rhinorrhea, sinus pressure and sore throat.   Eyes:  Negative for pain,  redness and  visual disturbance.  Respiratory:  Negative for cough, shortness of breath and wheezing.   Cardiovascular:  Negative for chest pain and palpitations.  Gastrointestinal:  Negative for abdominal pain, blood in stool, constipation and diarrhea.  Endocrine: Negative for polydipsia and polyuria.  Genitourinary:  Negative for dysuria, frequency and urgency.  Musculoskeletal:  Positive for arthralgias and back pain. Negative for myalgias.       Chronic back and hip pain  Skin:  Negative for pallor and rash.  Allergic/Immunologic: Negative for environmental allergies.  Neurological:  Negative for dizziness, syncope and headaches.  Hematological:  Negative for adenopathy. Does not bruise/bleed easily.  Psychiatric/Behavioral:  Negative for decreased concentration and dysphoric mood. The patient is not nervous/anxious.        Objective:   Physical Exam Constitutional:      General: She is not in acute distress.    Appearance: Normal appearance. She is well-developed. She is obese. She is not ill-appearing or diaphoretic.  HENT:     Head: Normocephalic and atraumatic.  Eyes:     Conjunctiva/sclera: Conjunctivae normal.     Pupils: Pupils are equal, round, and reactive to light.  Neck:     Thyroid: No thyromegaly.     Vascular: No carotid bruit or JVD.  Cardiovascular:     Rate and Rhythm: Normal rate and regular rhythm.     Heart sounds: Normal heart sounds.     No gallop.  Pulmonary:     Effort: Pulmonary effort is normal. No respiratory distress.     Breath sounds: Normal breath sounds. No wheezing or rales.  Abdominal:     General: There is no distension or abdominal bruit.     Palpations: Abdomen is soft.  Musculoskeletal:     Cervical back: Normal range of motion and neck supple.     Right lower leg: No edema.     Left lower leg: No edema.  Lymphadenopathy:     Cervical: No cervical adenopathy.  Skin:    General: Skin is warm and dry.     Coloration: Skin is not  pale.     Findings: No rash.  Neurological:     Mental Status: She is alert.     Coordination: Coordination normal.     Deep Tendon Reflexes: Reflexes are normal and symmetric. Reflexes normal.  Psychiatric:        Mood and Affect: Mood normal.           Assessment & Plan:   Problem List Items Addressed This Visit       Cardiovascular and Mediastinum   Essential hypertension - Primary    BP: 131/70  Improved Will continue amlodipine 10 mg daily (disc using supp socks for swelling prn) Continue metoprolol xl 50 mg daily from cardiology  Disc lifestyle change /diet /exercise Would consider water exercsie Continue cards care   Will continue to monitor at homw Her bp cuff ran higher than outs today

## 2022-04-23 NOTE — Patient Instructions (Signed)
Continue current medicines Keep watching your blood pressure   Keep avoiding processed foods when you can   Try to get most of your carbohydrates from produce (with the exception of white potatoes)  Eat less bread/pasta/rice/snack foods/cereals/sweets and other items from the middle of the grocery store (processed carbs)   Keep exercise Think about water program and also chair yoga  Use weight and bands when you can

## 2022-04-23 NOTE — Assessment & Plan Note (Signed)
BP: 131/70  Improved Will continue amlodipine 10 mg daily (disc using supp socks for swelling prn) Continue metoprolol xl 50 mg daily from cardiology  Disc lifestyle change /diet /exercise Would consider water exercsie Continue cards care   Will continue to monitor at homw Her bp cuff ran higher than outs today

## 2022-05-09 ENCOUNTER — Other Ambulatory Visit: Payer: Self-pay | Admitting: Family Medicine

## 2022-05-15 IMAGING — DX DG CHEST 1V PORT
1 series · 1 of 1 positions shown · non-contrast
Comparison: None.

CLINICAL DATA: Shortness of breath

EXAM:
PORTABLE CHEST 1 VIEW

[chest ap]
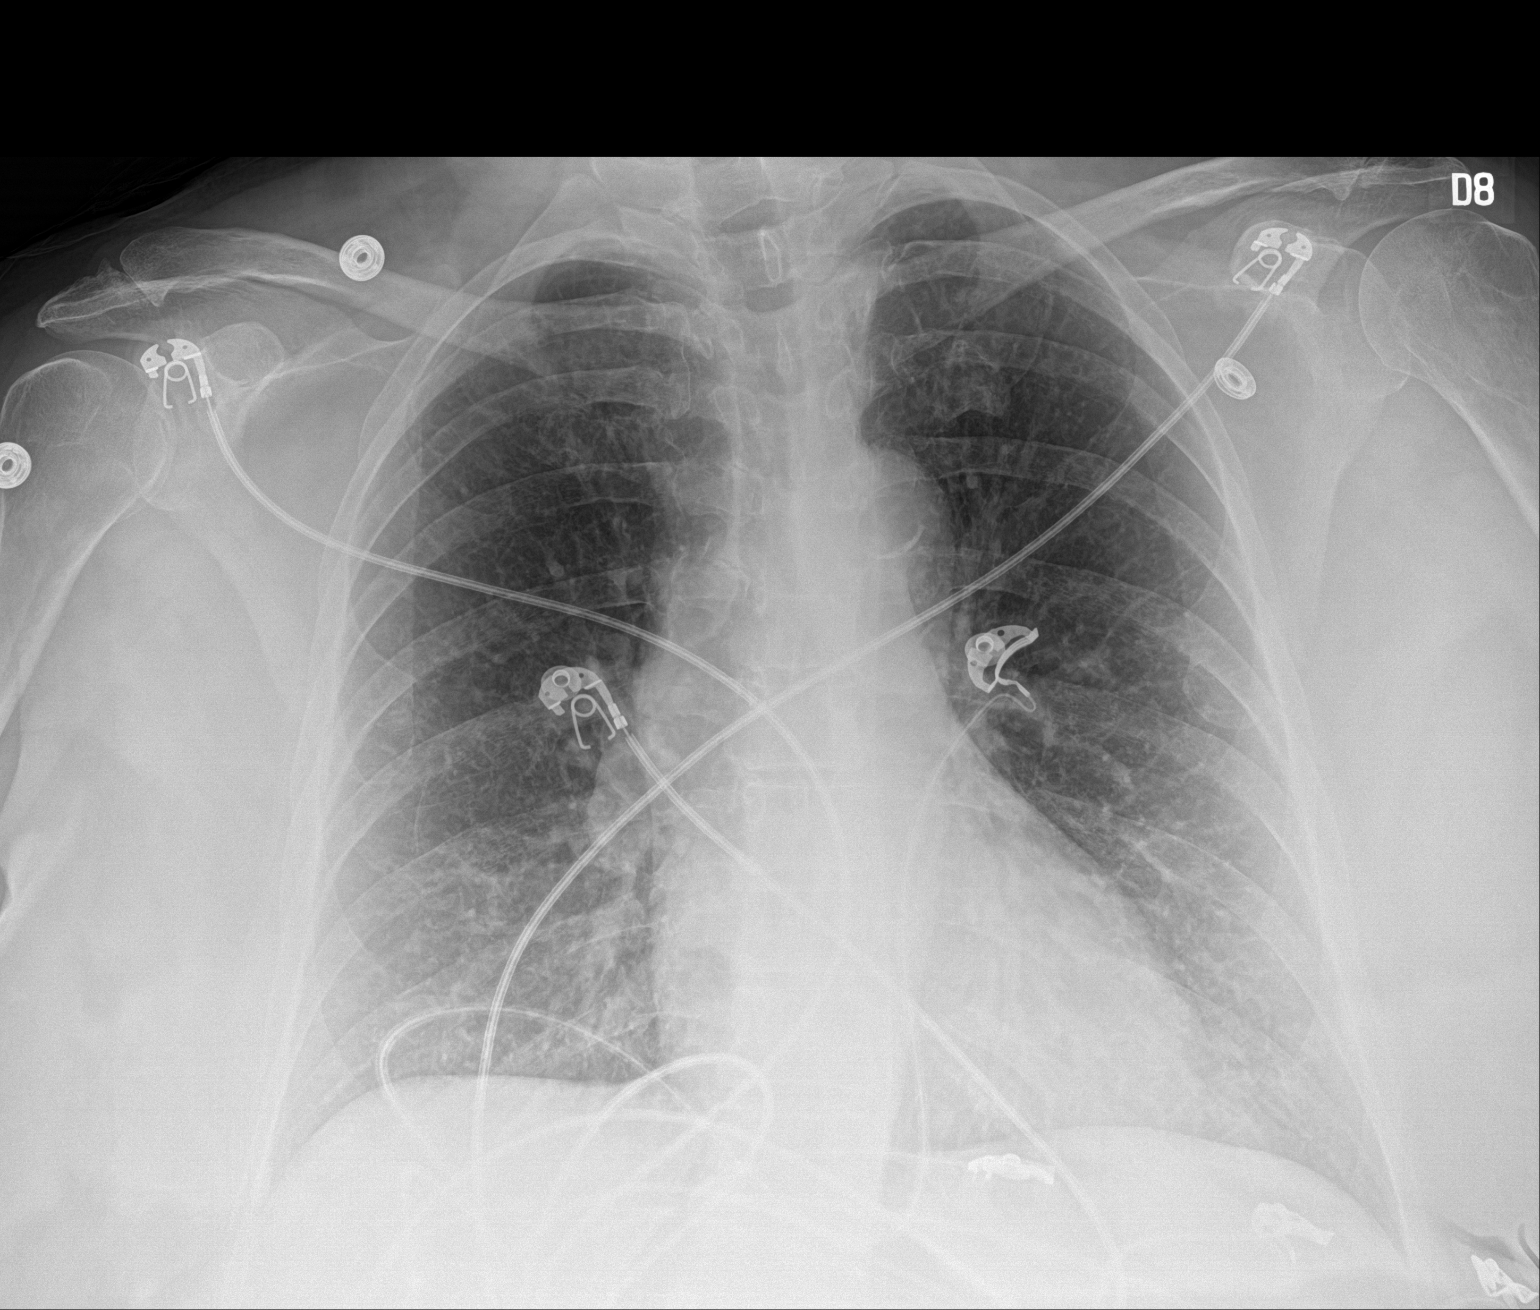

[1 of 1 positions shown; findings below may reference images not displayed]

FINDINGS: Cardiac shadow is within normal limits. Mild aortic calcifications
are seen. The lungs are clear. No bony abnormality is noted.
IMPRESSION: No active disease.

## 2022-06-02 ENCOUNTER — Encounter: Payer: Self-pay | Admitting: Family Medicine

## 2022-06-06 ENCOUNTER — Ambulatory Visit (INDEPENDENT_AMBULATORY_CARE_PROVIDER_SITE_OTHER): Payer: PPO | Admitting: Family Medicine

## 2022-06-06 ENCOUNTER — Encounter: Payer: Self-pay | Admitting: Family Medicine

## 2022-06-06 ENCOUNTER — Encounter: Payer: Self-pay | Admitting: *Deleted

## 2022-06-06 VITALS — BP 151/85 | HR 72 | Temp 97.8°F | Ht 66.0 in | Wt 195.5 lb

## 2022-06-06 DIAGNOSIS — I10 Essential (primary) hypertension: Secondary | ICD-10-CM

## 2022-06-06 DIAGNOSIS — L989 Disorder of the skin and subcutaneous tissue, unspecified: Secondary | ICD-10-CM | POA: Diagnosis not present

## 2022-06-06 MED ORDER — OLMESARTAN MEDOXOMIL 20 MG PO TABS: 20.0000 mg | ORAL_TABLET | Freq: Every day | ORAL | 1 refills | Status: DC

## 2022-06-06 NOTE — Assessment & Plan Note (Signed)
BP: (!) 151/85  Pt had to cut back amlodipine due to pedal edema   Will add olmesartan 20 mg daily  Lower amlodipine dose to 5 mg daily (tolerates better)  Continue metoprolol xl 50 mg dialy   Follow up in 2-3 weeks

## 2022-06-06 NOTE — Progress Notes (Signed)
Subjective:    Patient ID: Robin Gilmore, female    DOB: Feb 04, 1950, 72 y.o.   MRN: [id number]  HPI Pt presents for c/o knot on top of R hand  Also HTN    Wt Readings from Last 3 Encounters:  06/06/22 195 lb 8 oz (88.7 kg)  04/23/22 198 lb 2 oz (89.9 kg)  03/19/22 196 lb 6 oz (89.1 kg)   31.55 kg/m  Bump on her r hand  about 2 weeks  It is tender to the touch  Occ it hurs without touching it  Keeps traumatizing it and getting worse   Used neosporin  Tried to prick with a needle   Lots of sun when younger Wears sun protection sometimes   BP Readings from Last 3 Encounters:  06/06/22 (!) 150/86  04/23/22 131/70  03/19/22 (!) 151/70   Pulse Readings from Last 3 Encounters:  06/06/22 72  04/23/22 69  03/19/22 71    Worse swelling She cut her amlodipine to 3/4 pill    Patient Active Problem List   Diagnosis Date Noted   Skin lesion of hand 06/06/2022   Routine general medical examination at a health care facility 03/19/2021   Medicare annual wellness visit, subsequent 03/17/2021   Colon cancer screening 03/17/2021   Congenital coronary artery fistula 02/03/2021   Left leg pain 12/29/2019   Coronary aneurysm 12/29/2019   Essential hypertension 11/10/2018   Elevated glucose level 11/10/2018   Mitral valve prolapse 11/04/2017   Estrogen deficiency 08/09/2015   Obesity 11/18/2013   Encounter for routine gynecological examination 06/30/2012   Encounter for general adult medical examination with abnormal findings 01/29/2011   Family history of colonic polyps 01/29/2011   Hypothyroidism 05/16/2009   Hyperlipidemia LDL goal <130 05/16/2009   CYSTOCELE WITHOUT MENTION UTERINE PROLAPSE LAT 05/16/2009   Past Medical History:  Diagnosis Date   Constipation    Coronary artery disease    Hyperlipidemia    Hypothyroid    IBS (irritable bowel syndrome)    Menopausal syndrome    Mitral prolapse and regurge used to have prophylaxis   Obesity    Past Surgical  History:  Procedure Laterality Date   BREAST BIOPSY Right 04/18/2022   MM RT BREAST BX W LOC DEV 1ST LESION IMAGE BX SPEC STEREO GUIDE 04/18/2022 [facility]   BREAST EXCISIONAL BIOPSY Right    BREAST SURGERY     breast biopsy x 2 benign   CESAREAN SECTION     COLONOSCOPY WITH PROPOFOL N/A 05/16/2021   Procedure: COLONOSCOPY WITH PROPOFOL;  Surgeon: Midge Minium, MD;  Location: [facility];  Service: Endoscopy;  Laterality: N/A;   retrovaginal fistula repair     Social History   Tobacco Use   Smoking status: Former   Smokeless tobacco: Never  Building services engineer Use: Never used  Substance Use Topics   Alcohol use: No    Alcohol/week: 0.0 standard drinks of alcohol   Drug use: No   Family History  Problem Relation Age of Onset   Alcohol abuse Mother    Diabetes Mother    Heart disease Mother        ? MI CAD   Cancer Father        lung CA   Alcohol abuse Father    Hypertension Sister    Hyperlipidemia Sister    Alcohol abuse Brother    Colon polyps Brother    Obesity Maternal Grandfather    Breast cancer Neg Hx  Allergies  Allergen Reactions   Codeine     REACTION: Severe nausea and vomitnig   Current Outpatient Medications on File Prior to Visit  Medication Sig Dispense Refill   amLODipine (NORVASC) 10 MG tablet Take 1 tablet (10 mg total) by mouth daily. (Patient taking differently: Take 5 mg by mouth daily.) 90 tablet 3   aspirin EC 81 MG tablet Take 81 mg by mouth daily. Swallow whole.     calcium-vitamin D (OSCAL WITH D) 500-5 MG-MCG tablet Take 1 tablet by mouth. Pt states this has zinc and mag     gabapentin (NEURONTIN) 300 MG capsule TAKE 1 CAPSULE BY MOUTH ONCE DAILY FOR LEG PAIN 90 capsule 3   levothyroxine (SYNTHROID) 150 MCG tablet Take 1 tablet by mouth daily before breakfast 90 tablet 2   metoprolol succinate (TOPROL-XL) 50 MG 24 hr tablet Take 1 tablet (50 mg total) by mouth daily. Take with or immediately following a meal. 90 tablet 3    rosuvastatin (CRESTOR) 10 MG tablet Take 1 tablet (10 mg total) by mouth daily. 90 tablet 3   No current facility-administered medications on file prior to visit.     Review of Systems  Constitutional:  Negative for activity change, appetite change, fatigue, fever and unexpected weight change.  HENT:  Negative for congestion, ear pain, rhinorrhea, sinus pressure and sore throat.   Eyes:  Negative for pain, redness and visual disturbance.  Respiratory:  Negative for cough, shortness of breath and wheezing.   Cardiovascular:  Negative for chest pain and palpitations.  Gastrointestinal:  Negative for abdominal pain, blood in stool, constipation and diarrhea.  Endocrine: Negative for polydipsia and polyuria.  Genitourinary:  Negative for dysuria, frequency and urgency.  Musculoskeletal:  Negative for arthralgias, back pain and myalgias.  Skin:  Negative for pallor and rash.       Lesion on her hand   Allergic/Immunologic: Negative for environmental allergies.  Neurological:  Negative for dizziness, syncope and headaches.  Hematological:  Negative for adenopathy. Does not bruise/bleed easily.  Psychiatric/Behavioral:  Negative for decreased concentration and dysphoric mood. The patient is not nervous/anxious.        Objective:   Physical Exam Constitutional:      General: She is not in acute distress.    Appearance: Normal appearance. She is well-developed. She is obese. She is not ill-appearing or diaphoretic.  HENT:     Head: Normocephalic and atraumatic.  Eyes:     Conjunctiva/sclera: Conjunctivae normal.     Pupils: Pupils are equal, round, and reactive to light.  Neck:     Thyroid: No thyromegaly.     Vascular: No carotid bruit or JVD.  Cardiovascular:     Rate and Rhythm: Normal rate and regular rhythm.     Heart sounds: Normal heart sounds.     No gallop.  Pulmonary:     Effort: Pulmonary effort is normal. No respiratory distress.     Breath sounds: Normal breath sounds.  No wheezing or rales.  Abdominal:     General: There is no distension or abdominal bruit.     Palpations: Abdomen is soft.  Musculoskeletal:     Cervical back: Normal range of motion and neck supple.     Right lower leg: No edema.     Left lower leg: No edema.  Lymphadenopathy:     Cervical: No cervical adenopathy.  Skin:    General: Skin is warm and dry.     Coloration: Skin is not pale.  Findings: No rash.     Comments: 4 mm round raised pink lesion on dorsal R hand with scab/scale in center (otherwise shiny) This lies on top of a vein and protrudes significantly  Is mobile and mildly tender  Solar lentigines diffusely   Neurological:     Mental Status: She is alert.     Coordination: Coordination normal.     Deep Tendon Reflexes: Reflexes are normal and symmetric. Reflexes normal.  Psychiatric:        Mood and Affect: Mood normal.           Assessment & Plan:   Problem List Items Addressed This Visit       Cardiovascular and Mediastinum   Essential hypertension    BP: (!) 151/85  Pt had to cut back amlodipine due to pedal edema   Will add olmesartan 20 mg daily  Lower amlodipine dose to 5 mg daily (tolerates better)  Continue metoprolol xl 50 mg dialy   Follow up in 2-3 weeks         Relevant Medications   olmesartan (BENICAR) 20 MG tablet     Musculoskeletal and Integument   Skin lesion of hand - Primary    On dorsal right hand - with scale and some erythema  Is raised and also sits on top of a vein also  Cannot rule out early skin cancer  Discussed importance of sun protection   Referral done to dermatology for further eval       Relevant Orders   Ambulatory referral to Dermatology

## 2022-06-06 NOTE — Assessment & Plan Note (Signed)
On dorsal right hand - with scale and some erythema  Is raised and also sits on top of a vein also  Cannot rule out early skin cancer  Discussed importance of sun protection   Referral done to dermatology for further eval

## 2022-06-06 NOTE — Patient Instructions (Addendum)
Drop back to 5 mg on amlodipine  If swelling does not improve let us know   Continue metoprolol    Add olmesartan 20 mg daily  If any side effects or problems let us know   Avoid salty /processed food the best you can Stay active     I want to get you set up with dermatology I put the referral in  Please let us know if you don't hear in 1-2 weeks    Follow up in 2-3 weeks

## 2022-06-08 ENCOUNTER — Encounter: Payer: Self-pay | Admitting: Cardiology

## 2022-06-20 ENCOUNTER — Encounter: Payer: Self-pay | Admitting: Family Medicine

## 2022-06-20 ENCOUNTER — Ambulatory Visit (INDEPENDENT_AMBULATORY_CARE_PROVIDER_SITE_OTHER): Payer: PPO | Admitting: Family Medicine

## 2022-06-20 VITALS — BP 128/70 | HR 65 | Temp 97.9°F | Ht 66.0 in | Wt 194.0 lb

## 2022-06-20 DIAGNOSIS — I10 Essential (primary) hypertension: Secondary | ICD-10-CM | POA: Diagnosis not present

## 2022-06-20 DIAGNOSIS — L989 Disorder of the skin and subcutaneous tissue, unspecified: Secondary | ICD-10-CM

## 2022-06-20 LAB — BASIC METABOLIC PANEL
BUN: 17 mg/dL (ref 6–23)
CO2: 23 mEq/L (ref 19–32)
Calcium: 9.7 mg/dL (ref 8.4–10.5)
Chloride: 99 mEq/L (ref 96–112)
Creatinine, Ser: 0.72 mg/dL (ref 0.40–1.20)
GFR: 82.96 mL/min (ref >60.00)
Glucose, Bld: 128 mg/dL — ABNORMAL HIGH (ref 70–99)
Potassium: 3.9 mEq/L (ref 3.5–5.1)
Sodium: 139 mEq/L (ref 135–145)

## 2022-06-20 MED ORDER — OLMESARTAN MEDOXOMIL 20 MG PO TABS: 10.0000 mg | ORAL_TABLET | Freq: Every day | ORAL | 3 refills | Status: DC

## 2022-06-20 NOTE — Progress Notes (Signed)
Subjective:    Patient ID: Robin Gilmore, female    DOB: [birth year], 72 y.o.   MRN: [id number]  HPI Pt presents for f/u of HTN and chronic medical problems  Lesion on hand   Wt Readings from Last 3 Encounters:  06/20/22 194 lb (88 kg)  06/06/22 195 lb 8 oz (88.7 kg)  04/23/22 198 lb 2 oz (89.9 kg)   31.31 kg/m  Vitals:   06/20/22 0947  BP: 128/70  Pulse: 65  Temp: 97.9 F (36.6 C)  SpO2: 98%   Has lesion on R dorsal hand Hit it on something- a core (white and firm) came out  Now much smaller but still a bit red and rough feeling    Last visit bp was elevated  Pt had to cut back on amlodipine due to pedal edema (to 5 mg daily) We added olmesartan 20 mg daily  She had to dec to 10 (gave her palpitations and awful feeling in chest, headache, joints hurt, then fatigue   Continues metoprolol xl 50 mg daily   BP Readings from Last 3 Encounters:  06/20/22 128/70  06/06/22 (!) 151/85  04/23/22 131/70   Pulse Readings from Last 3 Encounters:  06/20/22 65  06/06/22 72  04/23/22 69    Continues cardiology f/u with Dr Anne Fu for coronary aneurysm   Now amlodipine 5 mg am  Olmesartan 10 at night  Metoprolol xl 50 mg in am   Feeling ok overall  A little headache- but it is better  Joint pain has settled down  No swelling in ankles   Trying to walk for exercise Chair exercise program also   Eating well   Bp at home 121/71  As low as one teens on top over 60s   Last metabolic panel Lab Results  Component Value Date   GLUCOSE 121 (H) 03/12/2022   NA 139 03/12/2022   K 4.0 03/12/2022   CL 101 03/12/2022   CO2 28 03/12/2022   BUN 17 03/12/2022   CREATININE 0.61 03/12/2022   EGFR 96 06/10/2020   CALCIUM 10.0 03/12/2022   PROT 6.8 03/12/2022   ALBUMIN 4.3 03/12/2022   BILITOT 0.8 03/12/2022   ALKPHOS 57 03/12/2022   AST 15 03/12/2022   ALT 15 03/12/2022   ANIONGAP 13 10/23/2019       Lab Results  Component Value Date   CHOL 123 03/12/2022    HDL 53.20 03/12/2022   LDLCALC 43 03/12/2022   LDLDIRECT 90.0 11/03/2018   TRIG 131.0 03/12/2022   CHOLHDL 2 03/12/2022   Patient Active Problem List   Diagnosis Date Noted   Skin lesion of hand 06/06/2022   Routine general medical examination at a health care facility 03/19/2021   Medicare annual wellness visit, subsequent 03/17/2021   Colon cancer screening 03/17/2021   Congenital coronary artery fistula 02/03/2021   Left leg pain 12/29/2019   Coronary aneurysm 12/29/2019   Essential hypertension 11/10/2018   Elevated glucose level 11/10/2018   Mitral valve prolapse 11/04/2017   Estrogen deficiency 08/09/2015   Obesity 11/18/2013   Encounter for routine gynecological examination 06/30/2012   Encounter for general adult medical examination with abnormal findings 01/29/2011   Family history of colonic polyps 01/29/2011   Hypothyroidism 05/16/2009   Hyperlipidemia LDL goal <130 05/16/2009   CYSTOCELE WITHOUT MENTION UTERINE PROLAPSE LAT 05/16/2009   Past Medical History:  Diagnosis Date   Constipation    Coronary artery disease    Hyperlipidemia    Hypothyroid  IBS (irritable bowel syndrome)    Menopausal syndrome    Mitral prolapse and regurge used to have prophylaxis   Obesity    Past Surgical History:  Procedure Laterality Date   BREAST BIOPSY Right 04/18/2022   MM RT BREAST BX W LOC DEV 1ST LESION IMAGE BX SPEC STEREO GUIDE 04/18/2022 [facility]   BREAST EXCISIONAL BIOPSY Right    BREAST SURGERY     breast biopsy x 2 benign   CESAREAN SECTION     COLONOSCOPY WITH PROPOFOL N/A 05/16/2021   Procedure: COLONOSCOPY WITH PROPOFOL;  Surgeon: Midge Minium, MD;  Location: [facility];  Service: Endoscopy;  Laterality: N/A;   retrovaginal fistula repair     Social History   Tobacco Use   Smoking status: Former   Smokeless tobacco: Never  Building services engineer Use: Never used  Substance Use Topics   Alcohol use: No    Alcohol/week: 0.0 standard drinks of  alcohol   Drug use: No   Family History  Problem Relation Age of Onset   Alcohol abuse Mother    Diabetes Mother    Heart disease Mother        ? MI CAD   Cancer Father        lung CA   Alcohol abuse Father    Hypertension Sister    Hyperlipidemia Sister    Alcohol abuse Brother    Colon polyps Brother    Obesity Maternal Grandfather    Breast cancer Neg Hx    Allergies  Allergen Reactions   Codeine     REACTION: Severe nausea and vomitnig   Current Outpatient Medications on File Prior to Visit  Medication Sig Dispense Refill   amLODipine (NORVASC) 10 MG tablet Take 1 tablet (10 mg total) by mouth daily. (Patient taking differently: Take 5 mg by mouth daily.) 90 tablet 3   aspirin EC 81 MG tablet Take 81 mg by mouth daily. Swallow whole.     calcium-vitamin D (OSCAL WITH D) 500-5 MG-MCG tablet Take 1 tablet by mouth. Pt states this has zinc and mag     gabapentin (NEURONTIN) 300 MG capsule TAKE 1 CAPSULE BY MOUTH ONCE DAILY FOR LEG PAIN 90 capsule 3   levothyroxine (SYNTHROID) 150 MCG tablet Take 1 tablet by mouth daily before breakfast 90 tablet 2   metoprolol succinate (TOPROL-XL) 50 MG 24 hr tablet Take 1 tablet (50 mg total) by mouth daily. Take with or immediately following a meal. 90 tablet 3   rosuvastatin (CRESTOR) 10 MG tablet Take 1 tablet (10 mg total) by mouth daily. 90 tablet 3   No current facility-administered medications on file prior to visit.    Review of Systems  Constitutional:  Negative for activity change, appetite change, fatigue, fever and unexpected weight change.  HENT:  Negative for congestion, ear pain, rhinorrhea, sinus pressure and sore throat.   Eyes:  Negative for pain, redness and visual disturbance.  Respiratory:  Negative for cough, shortness of breath and wheezing.   Cardiovascular:  Positive for palpitations. Negative for chest pain and leg swelling.       Palpitations are improved   Gastrointestinal:  Negative for abdominal pain, blood  in stool, constipation and diarrhea.  Endocrine: Negative for polydipsia and polyuria.  Genitourinary:  Negative for dysuria, frequency and urgency.  Musculoskeletal:  Negative for arthralgias, back pain and myalgias.  Skin:  Negative for pallor and rash.       Skin lesion  Allergic/Immunologic: Negative for  environmental allergies.  Neurological:  Negative for dizziness, syncope and headaches.  Hematological:  Negative for adenopathy. Does not bruise/bleed easily.  Psychiatric/Behavioral:  Negative for decreased concentration and dysphoric mood. The patient is not nervous/anxious.        Objective:   Physical Exam Constitutional:      General: She is not in acute distress.    Appearance: Normal appearance. She is well-developed. She is obese. She is not ill-appearing or diaphoretic.  HENT:     Head: Normocephalic and atraumatic.  Eyes:     Conjunctiva/sclera: Conjunctivae normal.     Pupils: Pupils are equal, round, and reactive to light.  Neck:     Thyroid: No thyromegaly.     Vascular: No carotid bruit or JVD.  Cardiovascular:     Rate and Rhythm: Normal rate and regular rhythm.     Heart sounds: Murmur heard.     No gallop.  Pulmonary:     Effort: Pulmonary effort is normal. No respiratory distress.     Breath sounds: Normal breath sounds. No wheezing or rales.  Abdominal:     General: There is no distension or abdominal bruit.     Palpations: Abdomen is soft.  Musculoskeletal:     Cervical back: Normal range of motion and neck supple.     Right lower leg: No edema.     Left lower leg: No edema.  Lymphadenopathy:     Cervical: No cervical adenopathy.  Skin:    General: Skin is warm and dry.     Coloration: Skin is not pale.     Findings: No rash.     Comments: 1-2 mm area of pink scale on R dorsal hand   Neurological:     Mental Status: She is alert.     Coordination: Coordination normal.     Deep Tendon Reflexes: Reflexes are normal and symmetric. Reflexes  normal.  Psychiatric:        Mood and Affect: Mood normal.           Assessment & Plan:   Problem List Items Addressed This Visit       Cardiovascular and Mediastinum   Essential hypertension - Primary    bp in fair control at this time  BP Readings from Last 1 Encounters:  06/20/22 128/70  No changes needed Most recent labs reviewed  Disc lifstyle change with low sodium diet and exercise  Doing well with  Amlodipine 5 mg in am (did not tolerate 10) Olmesartan 10 mg qhs (did not tolerate 20 or am dosing) Metoprolol xl 50 mg daily  Good health habits Bmet today  Reviewed last cardiology note       Relevant Medications   olmesartan (BENICAR) 20 MG tablet   Other Relevant Orders   Basic metabolic panel     Musculoskeletal and Integument   Skin lesion of hand

## 2022-06-20 NOTE — Patient Instructions (Signed)
Continue the medicines as you are taking them   Labs today  Blood pressure is great  Please let me know if you have more problems or side effects   Keep exercising

## 2022-06-20 NOTE — Assessment & Plan Note (Signed)
bp in fair control at this time  BP Readings from Last 1 Encounters:  06/20/22 128/70   No changes needed Most recent labs reviewed  Disc lifstyle change with low sodium diet and exercise  Doing well with  Amlodipine 5 mg in am (did not tolerate 10) Olmesartan 10 mg qhs (did not tolerate 20 or am dosing) Metoprolol xl 50 mg daily  Good health habits Bmet today  Reviewed last cardiology note

## 2022-06-20 NOTE — Assessment & Plan Note (Signed)
Pt was able to get a core out of it and now smaller Still some erythema and scale Will keep her dermatology appt

## 2022-07-05 DIAGNOSIS — M25552 Pain in left hip: Secondary | ICD-10-CM | POA: Diagnosis not present

## 2022-07-05 DIAGNOSIS — M25551 Pain in right hip: Secondary | ICD-10-CM | POA: Diagnosis not present

## 2022-07-12 ENCOUNTER — Ambulatory Visit
Admission: RE | Admit: 2022-07-12 | Discharge: 2022-07-12 | Disposition: A | Discharge status: Home / Self Care | Payer: PPO | Source: Ambulatory Visit | Attending: Family Medicine | Admitting: Family Medicine

## 2022-07-12 DIAGNOSIS — E2839 Other primary ovarian failure: Secondary | ICD-10-CM

## 2022-07-12 DIAGNOSIS — E349 Endocrine disorder, unspecified: Secondary | ICD-10-CM | POA: Diagnosis not present

## 2022-07-12 DIAGNOSIS — N958 Other specified menopausal and perimenopausal disorders: Secondary | ICD-10-CM | POA: Diagnosis not present

## 2022-07-15 ENCOUNTER — Encounter: Payer: Self-pay | Admitting: Family Medicine

## 2022-07-15 DIAGNOSIS — M8580 Other specified disorders of bone density and structure, unspecified site: Secondary | ICD-10-CM | POA: Insufficient documentation

## 2022-08-13 ENCOUNTER — Other Ambulatory Visit: Payer: Self-pay | Admitting: Family Medicine

## 2022-08-13 NOTE — Telephone Encounter (Signed)
Refill request for GABAPENTIN 300MG  CAPSULE   LOV - 06/20/22 Next OV - not scheduled Last refill - 03/19/22 #90/3

## 2022-08-31 ENCOUNTER — Ambulatory Visit: Payer: PPO | Attending: Cardiology | Admitting: Cardiology

## 2022-08-31 ENCOUNTER — Encounter: Payer: Self-pay | Admitting: Cardiology

## 2022-08-31 VITALS — BP 146/82 | HR 70 | Ht 66.0 in | Wt 194.0 lb

## 2022-08-31 DIAGNOSIS — I2541 Coronary artery aneurysm: Secondary | ICD-10-CM | POA: Diagnosis not present

## 2022-08-31 DIAGNOSIS — I10 Essential (primary) hypertension: Secondary | ICD-10-CM | POA: Diagnosis not present

## 2022-08-31 DIAGNOSIS — E785 Hyperlipidemia, unspecified: Secondary | ICD-10-CM

## 2022-08-31 NOTE — Progress Notes (Signed)
Cardiology Office Note:  .   Date:  08/31/2022  ID:  Robin Gilmore, DOB 10/06/1950, MRN [id number] PCP: Judy Pimple, MD  [facility] Providers Cardiologist:  Donato Schultz, MD    History of Present Illness: .    Discussed the use of AI scribe software for clinical note transcription with the patient, who gave verbal consent to proceed.  History of Present Illness   The patient, a 72 year old with a history of coronary artery disease, coronary artery aneurysms, coronary fistula, hypertension, hyperlipidemia, and mitral valve prolapse, presents for a follow-up visit. The patient's coronary artery aneurysms and fistula were discovered during a previous workup and have been discussed with a multidisciplinary team including interventional cardiology and cardiothoracic surgery. The decision was made to monitor these conditions clinically. The patient's most recent CT scan showed no significant changes in these conditions. The patient's echocardiogram in 2021 showed a normal ejection fraction of 60-65%. The patient's previous monitor showed second-degree heart block type one, and metoprolol was decreased. The patient did not report any higher symptoms such as syncope.  The patient also reports back and hip pain that limits her mobility and ability to engage in activities such as gardening. The patient describes the pain as severe, particularly after gardening, and it forces her to sit down and rest. The patient expresses frustration at her limited mobility and wishes she could walk as she used to.  The patient also has a history of breast mass, for which she underwent biopsy and surgery. The patient reports that she was not worried since cancer as it does not run in her family. The patient reports that she is doing well post-surgery.  The patient also reports hair loss, which she is unsure if it is related to her blood pressure medication. The patient reports that she has been self-adjusting  her blood pressure medication due to side effects such as a racing heart and feeling like she had taken muscle relaxers. The patient reports that she has found a regimen that works for her, taking amlodipine in the morning and Olmesartan at night.         Studies Reviewed: Marland Kitchen   EKG Interpretation Date/Time:  Friday August 31 2022 12:58:09 EDT Ventricular Rate:  79 PR Interval:  180 QRS Duration:  88 QT Interval:  374 QTC Calculation: 428 R Axis:   56  Text Interpretation: Normal sinus rhythm Normal ECG When compared with ECG of 23-Oct-2019 15:29, No significant change since last tracing Confirmed by Donato Schultz ([id number]) on 08/31/2022 1:00:30 PM    LABS LDL: 40.86 A1c: 5.5 Creatinine: 0.7 Hemoglobin: 14.2  RADIOLOGY Coronary CT scan: Coronary artery aneurysms: two saccular 17 mm x 18 mm off proximal left circumflex, 21 mm x 16 mm with fistulous connection to right pulmonary artery. Right coronary artery to left coronary fistula, tortuous with no significant extravasation contrast into right PA. (06/15/2021)  DIAGNOSTIC Echocardiogram: Normal. Ejection fraction 60-65%. (2021) Risk Assessment/Calculations:           Physical Exam:   VS:  BP (!) 146/82   Pulse 70   Ht 5\' 6"  (1.676 m)   Wt 194 lb (88 kg)   SpO2 95%   BMI 31.31 kg/m    Wt Readings from Last 3 Encounters:  08/31/22 194 lb (88 kg)  06/20/22 194 lb (88 kg)  06/06/22 195 lb 8 oz (88.7 kg)    GEN: Well nourished, well developed in no acute distress NECK: No JVD; No carotid bruits  CARDIAC: RRR, no murmurs, rubs, gallops RESPIRATORY:  Clear to auscultation without rales, wheezing or rhonchi  ABDOMEN: Soft, non-tender, non-distended EXTREMITIES:  No edema; No deformity   ASSESSMENT AND PLAN: .    Assessment and Plan    Coronary Artery Disease with Coronary Artery Aneurysms and Fistula Stable on recent imaging with no significant change. Echocardiogram in 2021 showed normal ejection fraction of 60-65%. No  symptoms of heart block after decreasing Metoprolol. -Continue current management and monitor clinically.  Hypertension Managed with Amlodipine 5mg  daily and Olmesartan 10mg  daily. Patient reports feeling well on this regimen. -Continue current medications.  Hyperlipidemia LDL 40.86 on Crestor 10mg , which is protective. -Continue Crestor 10mg  daily.  Mitral Valve Prolapse No new symptoms or changes reported. -Continue current management and monitor clinically.  General Health Maintenance Recent breast biopsies and surgery went well. Patient reports hair loss, but no changes in medication are recommended at this time. -Annual follow-up scheduled.              Signed, Donato Schultz, MD

## 2022-08-31 NOTE — Patient Instructions (Signed)

## 2022-10-04 ENCOUNTER — Encounter: Payer: Self-pay | Admitting: Family Medicine

## 2022-10-08 DIAGNOSIS — M25551 Pain in right hip: Secondary | ICD-10-CM | POA: Diagnosis not present

## 2022-10-08 NOTE — Telephone Encounter (Signed)
LVM for patient tcb and schedule

## 2022-11-14 DIAGNOSIS — M25551 Pain in right hip: Secondary | ICD-10-CM | POA: Diagnosis not present

## 2023-01-04 ENCOUNTER — Encounter: Payer: Self-pay | Admitting: Family Medicine

## 2023-01-06 ENCOUNTER — Encounter: Payer: Self-pay | Admitting: Pharmacist

## 2023-01-06 NOTE — Progress Notes (Signed)
Pharmacy Quality Measure Review  This patient is appearing on a report for being at risk of failing the adherence measure for cholesterol (statin) medications this calendar year.   Medication: rosuvastatin 10 mg Last fill date: 6/3 for 90 day supply  Prescription has expired. Will collaborate with provider to facilitate refill needs.   Jarrett Ables, PharmD PGY-1 Pharmacy Resident

## 2023-01-18 ENCOUNTER — Telehealth: Payer: Self-pay

## 2023-01-18 NOTE — Telephone Encounter (Signed)
 I am ok with it if pt says yes to them  If there is a change then we need to check TSH in about a month  Thanks

## 2023-01-18 NOTE — Telephone Encounter (Signed)
 Copied from CRM [id number]. Topic: Clinical - Prescription Issue >> Jan 18, 2023 12:20 PM Evie B wrote: Reason for CRM: [facility] mail [facility] calling regarding pt medication Levothyroxin 150 mcg.  Request providers approval to change the manufacture request  a new prescription or Verbal order. Please call [facility] at [phone]

## 2023-01-21 DIAGNOSIS — M25551 Pain in right hip: Secondary | ICD-10-CM | POA: Diagnosis not present

## 2023-01-21 NOTE — Telephone Encounter (Signed)
 [facility] advised of Dr. Royden Purl comments. They will contact the pt 1st.  Please schedule non fasting lab appt

## 2023-01-22 NOTE — Telephone Encounter (Addendum)
 Dr. Milinda Antis places her orders closer to their appt, please schedule lab appt in about a month and she will place orders

## 2023-01-22 NOTE — Telephone Encounter (Signed)
 Am I over looking orders for labs? I didn't see any.

## 2023-01-22 NOTE — Telephone Encounter (Signed)
 Lvmtcb

## 2023-01-29 ENCOUNTER — Other Ambulatory Visit: Payer: Self-pay | Admitting: Family Medicine

## 2023-01-29 MED ORDER — LEVOTHYROXINE SODIUM 150 MCG PO TABS: 150.0000 ug | ORAL_TABLET | Freq: Every day | ORAL | 0 refills | Status: DC

## 2023-01-29 NOTE — Telephone Encounter (Signed)
 Copied from CRM [id number]. Topic: Clinical - Medication Refill >> Jan 29, 2023  9:02 AM Kara C wrote: Most Recent Primary Care Visit:  Provider: RANDEEN HARDY A  Department: [facility]  Visit Type: OFFICE VISIT  Date: 06/20/2022  Medication: ***  Has the patient contacted their pharmacy? Yes, the preferred pharmacy said they contacted the provider but did not receive a response.  (Agent: If no, request that the patient contact the pharmacy for the refill. If patient does not wish to contact the pharmacy document the reason why and proceed with request.) (Agent: If yes, when and what did the pharmacy advise?)  Is this the correct pharmacy for this prescription? Yes If no, delete pharmacy and type the correct one.  This is the patient's preferred pharmacy:   [facility] - [place], [place] - [address] [place] [address] Phone: [phone] Fax: [phone]  Has the prescription been filled recently? No  Is the patient out of the medication? Yes  Has the patient been seen for an appointment in the last year OR does the patient have an upcoming appointment?   Can we respond through MyChart?   Agent: Please be advised that Rx refills may take up to 3 business days. We ask that you follow-up with your pharmacy.

## 2023-02-01 NOTE — Telephone Encounter (Signed)
Copied from CRM [id number]. Topic: Clinical - Prescription Issue >> Feb 01, 2023  2:00 PM Denese Killings wrote: Reason for CRM: [name] with [facility] have a different NDC  for levothyroxine (SYNTHROID) 150 MCG tablet and wants to know if it is ok to fill.

## 2023-02-07 ENCOUNTER — Other Ambulatory Visit: Payer: Self-pay | Admitting: Family Medicine

## 2023-02-07 ENCOUNTER — Telehealth: Payer: Self-pay

## 2023-02-07 ENCOUNTER — Encounter: Payer: Self-pay | Admitting: Family Medicine

## 2023-02-07 DIAGNOSIS — Z1231 Encounter for screening mammogram for malignant neoplasm of breast: Secondary | ICD-10-CM

## 2023-02-07 NOTE — Telephone Encounter (Signed)
Copied from CRM [id number]. Topic: Clinical - Prescription Issue >> Feb 06, 2023  3:59 PM Florestine Avers wrote: Reason for CRM: Ladona Ridgel from the Pharmacy calling in stating that the Levothyroxine that was sent in the [facility] was different, and they need to speak to a provider to authorize the refill. They can be reached at [phone].

## 2023-02-07 NOTE — Telephone Encounter (Signed)
I am ok with that IF pt says she is ok with it  If we do change generic brand then I need to check TSH in 6-8 weeks to make sue is is stable

## 2023-02-07 NOTE — Telephone Encounter (Signed)
See mychart message, the manufacture is different and we have to give an okay to change it. It is aware and is asking for this change also

## 2023-02-08 NOTE — Telephone Encounter (Signed)
Pt is okay with it change. Called and confirmed change. Pt has CPE in March so we can recheck levels then

## 2023-02-11 ENCOUNTER — Other Ambulatory Visit: Payer: Self-pay | Admitting: Family Medicine

## 2023-02-12 NOTE — Telephone Encounter (Signed)
Refill request for gabapentin (NEURONTIN) 300 MG capsule   LOV - 06/20/22 Next OV - 03/29/23 Last refill - 08/13/22 #90/1

## 2023-02-22 ENCOUNTER — Telehealth (INDEPENDENT_AMBULATORY_CARE_PROVIDER_SITE_OTHER): Payer: Self-pay | Admitting: Family Medicine

## 2023-02-22 ENCOUNTER — Other Ambulatory Visit (INDEPENDENT_AMBULATORY_CARE_PROVIDER_SITE_OTHER): Payer: PPO

## 2023-02-22 DIAGNOSIS — E785 Hyperlipidemia, unspecified: Secondary | ICD-10-CM

## 2023-02-22 DIAGNOSIS — E039 Hypothyroidism, unspecified: Secondary | ICD-10-CM | POA: Diagnosis not present

## 2023-02-22 DIAGNOSIS — R7309 Other abnormal glucose: Secondary | ICD-10-CM

## 2023-02-22 DIAGNOSIS — I10 Essential (primary) hypertension: Secondary | ICD-10-CM | POA: Diagnosis not present

## 2023-02-22 LAB — CBC WITH DIFFERENTIAL/PLATELET
Basophils Absolute: 0.1 10*3/uL (ref 0.0–0.1)
Basophils Relative: 1 % (ref 0.0–3.0)
Eosinophils Absolute: 0.1 10*3/uL (ref 0.0–0.7)
Eosinophils Relative: 1.7 % (ref 0.0–5.0)
HCT: 42.3 % (ref 36.0–46.0)
Hemoglobin: 14 g/dL (ref 12.0–15.0)
Lymphocytes Relative: 24.6 % (ref 12.0–46.0)
Lymphs Abs: 1.6 10*3/uL (ref 0.7–4.0)
MCHC: 33.1 g/dL (ref 30.0–36.0)
MCV: 87.9 fL (ref 78.0–100.0)
Monocytes Absolute: 0.6 10*3/uL (ref 0.1–1.0)
Monocytes Relative: 9.8 % (ref 3.0–12.0)
Neutro Abs: 4.1 10*3/uL (ref 1.4–7.7)
Neutrophils Relative %: 62.9 % (ref 43.0–77.0)
Platelets: 308 10*3/uL (ref 150.0–400.0)
RBC: 4.81 Mil/uL (ref 3.87–5.11)
RDW: 13.8 % (ref 11.5–15.5)
WBC: 6.5 10*3/uL (ref 4.0–10.5)

## 2023-02-22 LAB — LIPID PANEL
Cholesterol: 143 mg/dL (ref 0–200)
HDL: 67 mg/dL (ref >39.00)
LDL Cholesterol: 45 mg/dL (ref 0–99)
NonHDL: 76.34
Total CHOL/HDL Ratio: 2
Triglycerides: 156 mg/dL — ABNORMAL HIGH (ref 0.0–149.0)
VLDL: 31.2 mg/dL (ref 0.0–40.0)

## 2023-02-22 LAB — COMPREHENSIVE METABOLIC PANEL
ALT: 16 U/L (ref 0–35)
AST: 15 U/L (ref 0–37)
Albumin: 4.5 g/dL (ref 3.5–5.2)
Alkaline Phosphatase: 57 U/L (ref 39–117)
BUN: 17 mg/dL (ref 6–23)
CO2: 27 meq/L (ref 19–32)
Calcium: 9.5 mg/dL (ref 8.4–10.5)
Chloride: 100 meq/L (ref 96–112)
Creatinine, Ser: 0.61 mg/dL (ref 0.40–1.20)
GFR: 88.29 mL/min (ref >60.00)
Glucose, Bld: 135 mg/dL — ABNORMAL HIGH (ref 70–99)
Potassium: 4.3 meq/L (ref 3.5–5.1)
Sodium: 139 meq/L (ref 135–145)
Total Bilirubin: 0.7 mg/dL (ref 0.2–1.2)
Total Protein: 6.8 g/dL (ref 6.0–8.3)

## 2023-02-22 LAB — HEMOGLOBIN A1C: Hgb A1c MFr Bld: 5.8 % (ref 4.6–6.5)

## 2023-02-22 LAB — TSH: TSH: 0.78 u[IU]/mL (ref 0.35–5.50)

## 2023-02-22 NOTE — Telephone Encounter (Signed)
 Lab orders for physica   I don't think we need the urine unless there is something I don't know

## 2023-02-26 DIAGNOSIS — Z961 Presence of intraocular lens: Secondary | ICD-10-CM | POA: Diagnosis not present

## 2023-03-02 ENCOUNTER — Encounter: Payer: Self-pay | Admitting: Cardiology

## 2023-03-04 ENCOUNTER — Other Ambulatory Visit: Payer: Self-pay

## 2023-03-04 MED ORDER — METOPROLOL SUCCINATE ER 50 MG PO TB24: 50.0000 mg | ORAL_TABLET | Freq: Every day | ORAL | 2 refills | Status: DC

## 2023-03-25 ENCOUNTER — Ambulatory Visit
Admission: RE | Admit: 2023-03-25 | Discharge: 2023-03-25 | Disposition: A | Discharge status: Home / Self Care | Payer: PPO | Source: Ambulatory Visit | Attending: Family Medicine | Admitting: Family Medicine

## 2023-03-25 DIAGNOSIS — Z1231 Encounter for screening mammogram for malignant neoplasm of breast: Secondary | ICD-10-CM

## 2023-03-28 ENCOUNTER — Encounter: Payer: Self-pay | Admitting: Family Medicine

## 2023-03-29 ENCOUNTER — Ambulatory Visit: Payer: PPO

## 2023-03-29 ENCOUNTER — Ambulatory Visit: Payer: PPO | Admitting: Family Medicine

## 2023-03-29 ENCOUNTER — Encounter: Payer: Self-pay | Admitting: Family Medicine

## 2023-03-29 VITALS — BP 128/70 | HR 79 | Temp 97.5°F | Ht 66.5 in | Wt 197.0 lb

## 2023-03-29 VITALS — Ht 66.0 in | Wt 194.0 lb

## 2023-03-29 DIAGNOSIS — I10 Essential (primary) hypertension: Secondary | ICD-10-CM

## 2023-03-29 DIAGNOSIS — Z0000 Encounter for general adult medical examination without abnormal findings: Secondary | ICD-10-CM

## 2023-03-29 DIAGNOSIS — R7303 Prediabetes: Secondary | ICD-10-CM

## 2023-03-29 DIAGNOSIS — E785 Hyperlipidemia, unspecified: Secondary | ICD-10-CM | POA: Diagnosis not present

## 2023-03-29 DIAGNOSIS — M8580 Other specified disorders of bone density and structure, unspecified site: Secondary | ICD-10-CM | POA: Diagnosis not present

## 2023-03-29 DIAGNOSIS — Z6831 Body mass index (BMI) 31.0-31.9, adult: Secondary | ICD-10-CM

## 2023-03-29 DIAGNOSIS — Z1211 Encounter for screening for malignant neoplasm of colon: Secondary | ICD-10-CM

## 2023-03-29 DIAGNOSIS — E6609 Other obesity due to excess calories: Secondary | ICD-10-CM | POA: Diagnosis not present

## 2023-03-29 DIAGNOSIS — E039 Hypothyroidism, unspecified: Secondary | ICD-10-CM | POA: Diagnosis not present

## 2023-03-29 DIAGNOSIS — E66811 Obesity, class 1: Secondary | ICD-10-CM

## 2023-03-29 MED ORDER — AMLODIPINE BESYLATE 5 MG PO TABS: 5.0000 mg | ORAL_TABLET | Freq: Every day | ORAL | 3 refills | Status: DC

## 2023-03-29 MED ORDER — GABAPENTIN 300 MG PO CAPS: ORAL_CAPSULE | ORAL | 3 refills | Status: AC

## 2023-03-29 MED ORDER — OLMESARTAN MEDOXOMIL 20 MG PO TABS: 10.0000 mg | ORAL_TABLET | Freq: Every day | ORAL | 3 refills | Status: DC

## 2023-03-29 MED ORDER — LEVOTHYROXINE SODIUM 150 MCG PO TABS: 150.0000 ug | ORAL_TABLET | Freq: Every day | ORAL | 3 refills | Status: AC

## 2023-03-29 NOTE — Progress Notes (Signed)
 Subjective:   Robin Gilmore is a 73 y.o. who presents for a Medicare Wellness preventive visit.  Visit Complete: Virtual I connected with  Robin Gilmore on 03/29/23 by a audio enabled telemedicine application and verified that I am speaking with the correct person using two identifiers.  Patient Location: Home  Provider Location: Office/Clinic  I discussed the limitations of evaluation and management by telemedicine. The patient expressed understanding and agreed to proceed.  Vital Signs: Because this visit was a virtual/telehealth visit, some criteria may be missing or patient reported. Any vitals not documented were not able to be obtained and vitals that have been documented are patient reported.  VideoDeclined- This patient declined Librarian, academic. Therefore the visit was completed with audio only.  Persons Participating in Visit: Patient.  AWV Questionnaire: Yes: Patient Medicare AWV questionnaire was completed by the patient on 03/28/23; I have confirmed that all information answered by patient is correct and no changes since this date.  Cardiac Risk Factors include: advanced age (>2men, >12 women);dyslipidemia;hypertension;obesity (BMI >30kg/m2)     Objective:    Today's Vitals   03/29/23 0814 03/29/23 0815  Weight: 194 lb (88 kg)   Height: 5\' 6"  (1.676 m)   PainSc:  7    Body mass index is 31.31 kg/m.     03/29/2023    8:26 AM 03/19/2022    8:45 AM 05/16/2021    8:25 AM 12/16/2019   11:30 AM 11/03/2018    3:40 PM 10/29/2017   12:28 PM 08/23/2015   10:32 AM  Advanced Directives  Does Patient Have a Medical Advance Directive? No No No No No No No  Would patient like information on creating a medical advance directive?  No - Patient declined  No - Patient declined No - Patient declined No - Patient declined Yes - Educational materials given    Current Medications (verified) Outpatient Encounter Medications as of 03/29/2023   Medication Sig   amLODipine (NORVASC) 10 MG tablet Take 1 tablet (10 mg total) by mouth daily. (Patient taking differently: Take 5 mg by mouth daily.)   aspirin EC 81 MG tablet Take 81 mg by mouth daily. Swallow whole.   calcium-vitamin D (OSCAL WITH D) 500-5 MG-MCG tablet Take 1 tablet by mouth. Pt states this has zinc and mag   gabapentin (NEURONTIN) 300 MG capsule TAKE ONE CAPSULE BY MOUTH ONCE DAILY FOR LEG PAIN   levothyroxine (SYNTHROID) 150 MCG tablet Take 1 tablet (150 mcg total) by mouth daily before breakfast.   metoprolol succinate (TOPROL-XL) 50 MG 24 hr tablet Take 1 tablet (50 mg total) by mouth daily. Take with or immediately following a meal.   olmesartan (BENICAR) 20 MG tablet Take 0.5 tablets (10 mg total) by mouth at bedtime.   rosuvastatin (CRESTOR) 10 MG tablet Take 1 tablet (10 mg total) by mouth daily.   No facility-administered encounter medications on file as of 03/29/2023.    Allergies (verified) Codeine   History: Past Medical History:  Diagnosis Date   Arthritis    Constipation    Coronary artery disease    GERD (gastroesophageal reflux disease)    Heart murmur    Hyperlipidemia    Hypertension    Hypothyroid    IBS (irritable bowel syndrome)    Menopausal syndrome    Mitral prolapse and regurge used to have prophylaxis   Obesity    Past Surgical History:  Procedure Laterality Date   BREAST BIOPSY Right 04/18/2022  MM RT BREAST BX W LOC DEV 1ST LESION IMAGE BX SPEC STEREO GUIDE 04/18/2022 [facility]   BREAST EXCISIONAL BIOPSY Right    BREAST SURGERY     breast biopsy x 2 benign   CESAREAN SECTION     COLONOSCOPY WITH PROPOFOL N/A 05/16/2021   Procedure: COLONOSCOPY WITH PROPOFOL;  Surgeon: Midge Minium, MD;  Location: [facility] ENDOSCOPY;  Service: Endoscopy;  Laterality: N/A;   COSMETIC SURGERY     Eyelids   EYE SURGERY     Cararacts   retrovaginal fistula repair     Family History  Problem Relation Age of Onset   Alcohol abuse Mother     Diabetes Mother    Heart disease Mother        ? MI CAD   Cancer Father        lung CA   Alcohol abuse Father    Hypertension Sister    Hyperlipidemia Sister    Alcohol abuse Brother    Colon polyps Brother    Obesity Maternal Grandfather    Breast cancer Neg Hx    Social History   Socioeconomic History   Marital status: Married    Spouse name: Not on file   Number of children: Not on file   Years of education: Not on file   Highest education level: Associate degree: occupational, Scientist, product/process development, or vocational program  Occupational History   Not on file  Tobacco Use   Smoking status: Former   Smokeless tobacco: Never  Vaping Use   Vaping status: Never Used  Substance and Sexual Activity   Alcohol use: No   Drug use: No   Sexual activity: Not Currently  Other Topics Concern   Not on file  Social History Narrative   Not on file   Social Drivers of Health   Financial Resource Strain: Low Risk  (03/28/2023)   Overall Financial Resource Strain (CARDIA)    Difficulty of Paying Living Expenses: Not very hard  Food Insecurity: No Food Insecurity (03/28/2023)   Hunger Vital Sign    Worried About Running Out of Food in the Last Year: Never true    Ran Out of Food in the Last Year: Never true  Transportation Needs: No Transportation Needs (03/28/2023)   PRAPARE - Administrator, Civil Service (Medical): No    Lack of Transportation (Non-Medical): No  Physical Activity: Insufficiently Active (03/28/2023)   Exercise Vital Sign    Days of Exercise per Week: 3 days    Minutes of Exercise per Session: 20 min  Stress: No Stress Concern Present (03/28/2023)   [employer] of Occupational Health - Occupational Stress Questionnaire    Feeling of Stress : Only a little  Social Connections: Socially Integrated (03/28/2023)   Social Connection and Isolation Panel [NHANES]    Frequency of Communication with Friends and Family: Three times a week    Frequency of Social  Gatherings with Friends and Family: More than three times a week    Attends Religious Services: More than 4 times per year    Active Member of Clubs or Organizations: Yes    Attends Banker Meetings: 1 to 4 times per year    Marital Status: Married    Tobacco Counseling Counseling given: Not Answered  Clinical Intake:  Pre-visit preparation completed: Yes  Pain : 0-10 Pain Score: 7  Pain Type: Chronic pain Pain Location: Hip (both hips and lower back) Pain Descriptors / Indicators: Aching Pain Onset: More than a  month ago Pain Frequency: Constant Pain Relieving Factors: Tylenol;heating pad  Pain Relieving Factors: Tylenol;heating pad  BMI - recorded: 31.31 Nutritional Status: BMI > 30  Obese Nutritional Risks: None Diabetes: No  How often do you need to have someone help you when you read instructions, pamphlets, or other written materials from your doctor or pharmacy?: 1 - Never  Interpreter Needed?: No  Comments: lives with husband Information entered by :: B.Johnie Makki,LPN   Activities of Daily Living     03/29/2023    8:27 AM  In your present state of health, do you have any difficulty performing the following activities:  Hearing? 0  Vision? 0  Difficulty concentrating or making decisions? 0  Walking or climbing stairs? 1  Dressing or bathing? 0  Doing errands, shopping? 0  Preparing Food and eating ? N  Using the Toilet? N  In the past six months, have you accidently leaked urine? N  Do you have problems with loss of bowel control? N  Managing your Medications? N  Managing your Finances? N  Housekeeping or managing your Housekeeping? N    Patient Care Team: Tower, Audrie Gallus, MD as PCP - General Anne Fu Veverly Fells, MD as PCP - Cardiology (Cardiology)  Indicate any recent Medical Services you may have received from other than [facility] providers in the past year (date may be approximate).     Assessment:   This is a routine wellness examination for  Yazmin.  Hearing/Vision screen Hearing Screening - Comments:: Pt says her hearing is good enough Vision Screening - Comments:: Pt says her vision is good after cataract surgery last year;distance glasses only Dr Alvester Morin   Goals Addressed               This Visit's Progress     DIET - REDUCE SUGAR INTAKE (pt-stated)   Not on track     03/29/23 Lower glucose level Stay hydrated      Increase physical activity        03/29/23- I will continue to walk at 30 minutes 2-3 days a week and to do strength training with hand weights intermittently.       Patient Stated   On track     03/29/23- I will maintain and continue medications as prescribed.        Depression Screen     03/29/2023    8:22 AM 06/20/2022    9:55 AM 06/06/2022    2:02 PM 04/23/2022   10:11 AM 03/19/2022   10:03 AM 03/19/2022    8:41 AM 03/19/2021    2:41 AM  PHQ 2/9 Scores  PHQ - 2 Score 0 0 0 0 0 0 0  PHQ- 9 Score  0 0 0 0      Fall Risk     03/29/2023    8:19 AM 06/20/2022    9:55 AM 06/06/2022    2:01 PM 04/23/2022   10:11 AM 03/19/2022   10:03 AM  Fall Risk   Falls in the past year? 0 0 0 0 0  Number falls in past yr: 0 0 0 0 0  Injury with Fall? 0 0 0 0 0  Risk for fall due to : No Fall Risks No Fall Risks No Fall Risks No Fall Risks No Fall Risks  Follow up Education provided;Falls prevention discussed Falls evaluation completed Falls evaluation completed Falls evaluation completed Falls evaluation completed    MEDICARE RISK AT HOME:  Medicare Risk at Home Any stairs in or  around the home?: No If so, are there any without handrails?: No Home free of loose throw rugs in walkways, pet beds, electrical cords, etc?: Yes Adequate lighting in your home to reduce risk of falls?: Yes Life alert?: No Use of a cane, walker or w/c?: No Grab bars in the bathroom?: No Shower chair or bench in shower?: Yes Elevated toilet seat or a handicapped toilet?: Yes  TIMED UP AND GO:  Was the test performed?  No  Cognitive  Function: 6CIT completed    12/16/2019   11:35 AM 11/03/2018    3:42 PM 10/29/2017   12:27 PM 08/23/2015   10:36 AM  MMSE - Mini Mental State Exam  Orientation to time 5 5 5 5   Orientation to Place 5 5 5 5   Registration 3 3 3 3   Attention/ Calculation 5 5 0 0  Recall 3 3 3 3   Language- name 2 objects   0 0  Language- repeat 1 1 1 1   Language- follow 3 step command   3 3  Language- read & follow direction   0 0  Write a sentence   0 0  Copy design   0 0  Total score   20 20        03/29/2023    8:27 AM 03/19/2022    8:40 AM  6CIT Screen  What Year? 0 points 0 points  What month? 0 points 0 points  What time? 0 points 0 points  Count back from 20 0 points 0 points  Months in reverse 0 points 0 points  Repeat phrase 0 points 0 points  Total Score 0 points 0 points    Immunizations Immunization History  Administered Date(s) Administered   Fluad Quad(high Dose 65+) 11/10/2018, 12/29/2019   Influenza Split 01/29/2011   Influenza Whole 11/21/2009   Influenza, High Dose Seasonal PF 10/24/2016, 10/29/2017   Influenza,inj,Quad PF,6+ Mos 11/18/2013   Influenza-Unspecified 11/12/2012, 10/24/2016, 11/17/2020, 12/22/2021, 10/05/2022   PFIZER(Purple Top)SARS-COV-2 Vaccination 03/15/2019, 04/08/2019   Pneumococcal Conjugate-13 08/09/2015   Pneumococcal Polysaccharide-23 08/15/2016   Td 11/15/2001, 03/22/2022   Tdap 01/29/2011   Zoster Recombinant(Shingrix) 03/25/2021, 05/30/2021   Zoster, Live 11/22/2012    Screening Tests Health Maintenance  Topic Date Due   COVID-19 Vaccine (3 - 2024-25 season) 09/16/2022   MAMMOGRAM  03/24/2024   Medicare Annual Wellness (AWV)  03/28/2024   Colonoscopy  05/17/2031   DTaP/Tdap/Td (4 - Td or Tdap) 03/21/2032   Pneumonia Vaccine 76+ Years old  Completed   INFLUENZA VACCINE  Completed   DEXA SCAN  Completed   Hepatitis C Screening  Completed   Zoster Vaccines- Shingrix  Completed   HPV VACCINES  Aged Out    Health Maintenance  Health  Maintenance Due  Topic Date Due   COVID-19 Vaccine (3 - 2024-25 season) 09/16/2022   Health Maintenance Items Addressed: none needed   Additional Screening:  Vision Screening: Recommended annual ophthalmology exams for early detection of glaucoma and other disorders of the eye.  Dental Screening: Recommended annual dental exams for proper oral hygiene  Community Resource Referral / Chronic Care Management: CRR required this visit?  No   CCM required this visit?  No     Plan:     I have personally reviewed and noted the following in the patient's chart:   Medical and social history Use of alcohol, tobacco or illicit drugs  Current medications and supplements including opioid prescriptions. Patient is not currently taking opioid prescriptions. Functional ability and  status Nutritional status Physical activity Advanced directives List of other physicians Hospitalizations, surgeries, and ER visits in previous 12 months Vitals Screenings to include cognitive, depression, and falls Referrals and appointments  In addition, I have reviewed and discussed with patient certain preventive protocols, quality metrics, and best practice recommendations. A written personalized care plan for preventive services as well as general preventive health recommendations were provided to patient.    Sue Lush, LPN   04/23/8117   After Visit Summary: (MyChart) Due to this being a telephonic visit, the after visit summary with patients personalized plan was offered to patient via MyChart   Notes: Nothing significant to report at this time.

## 2023-03-29 NOTE — Progress Notes (Signed)
 Subjective:    Patient ID: Robin Gilmore, female    DOB: 03-Sep-1950, 73 y.o.   MRN: [id number]  HPI  Here for health maintenance exam and to review chronic medical problems   Wt Readings from Last 3 Encounters:  03/29/23 197 lb (89.4 kg)  03/29/23 194 lb (88 kg)  08/31/22 194 lb (88 kg)   31.32 kg/m  Vitals:   03/29/23 1510 03/29/23 1539  BP: 138/66 128/70  Pulse: 79   Temp: (!) 97.5 F (36.4 C)   SpO2: 94%     Immunization History  Administered Date(s) Administered   Fluad Quad(high Dose 65+) 11/10/2018, 12/29/2019   Influenza Split 01/29/2011   Influenza Whole 11/21/2009   Influenza, High Dose Seasonal PF 10/24/2016, 10/29/2017   Influenza,inj,Quad PF,6+ Mos 11/18/2013   Influenza-Unspecified 11/12/2012, 10/24/2016, 11/17/2020, 12/22/2021, 10/05/2022   PFIZER(Purple Top)SARS-COV-2 Vaccination 03/15/2019, 04/08/2019   Pneumococcal Conjugate-13 08/09/2015   Pneumococcal Polysaccharide-23 08/15/2016   Td 11/15/2001, 03/22/2022   Tdap 01/29/2011   Zoster Recombinant(Shingrix) 03/25/2021, 05/30/2021   Zoster, Live 11/22/2012    There are no preventive care reminders to display for this patient.   Mammogram 3/.2025  Self breast exam no lumps   Gyn health No problems    Colon cancer screening  05/2021  normal / no recall  Never had polyp  Did have fam history of polyps so she is thankful   Bone health  Dexa 06/2022  Osteopenia  Falls-none  Fractures-none  Supplements ca and D with mag and zinc   Exercise  Working in the yard  Chronic back and hip pain - limits her  Hand weights  Some chair exercise   Mood    03/29/2023    3:19 PM 03/29/2023    8:22 AM 06/20/2022    9:55 AM 06/06/2022    2:02 PM 04/23/2022   10:11 AM  Depression screen PHQ 2/9  Decreased Interest 0 0 0 0 0  Down, Depressed, Hopeless 0 0 0 0 0  PHQ - 2 Score 0 0 0 0 0  Altered sleeping 0  0 0 0  Tired, decreased energy 1  0 0 0  Change in appetite 0  0 0 0  Feeling bad or  failure about yourself  0  0 0 0  Trouble concentrating 0  0 0 0  Moving slowly or fidgety/restless 0  0 0 0  Suicidal thoughts 0  0 0 0  PHQ-9 Score 1  0 0 0  Difficult doing work/chores Not difficult at all  Not difficult at all Not difficult at all Not difficult at all   HTN bp is stable today  No cp or palpitations or headaches or edema  No side effects to medicines  BP Readings from Last 3 Encounters:  03/29/23 128/70  08/31/22 (!) 146/82  06/20/22 128/70    Amlodipine 5 mg in am (did not tolerate 10) Olmesartan 10 mg qhs (did not tolerate 20 or am dosing) Metoprolol xl 50 mg daily   Lab Results  Component Value Date   NA 139 02/22/2023   K 4.3 02/22/2023   CO2 27 02/22/2023   GLUCOSE 135 (H) 02/22/2023   BUN 17 02/22/2023   CREATININE 0.61 02/22/2023   CALCIUM 9.5 02/22/2023   GFR 88.29 02/22/2023   EGFR 96 06/10/2020   GFRNONAA >60 10/23/2019    Hypothyroidism  Pt has no clinical changes No change in energy level/ hair or skin/ edema and no tremor Lab Results  Component Value  Date   TSH 0.78 02/22/2023    Levothyroxine 150 mcg daily   Glucose  Lab Results  Component Value Date   HGBA1C 5.8 02/22/2023   HGBA1C 5.5 03/12/2022   HGBA1C 5.5 03/17/2021   Does crave sweets     Hyperlipidemia Lab Results  Component Value Date   CHOL 143 02/22/2023   CHOL 123 03/12/2022   CHOL 163 03/17/2021   Lab Results  Component Value Date   HDL 67.00 02/22/2023   HDL 53.20 03/12/2022   HDL 57 03/17/2021   Lab Results  Component Value Date   LDLCALC 45 02/22/2023   LDLCALC 43 03/12/2022   LDLCALC 79 03/17/2021   Lab Results  Component Value Date   TRIG 156.0 (H) 02/22/2023   TRIG 131.0 03/12/2022   TRIG 174 (H) 03/17/2021   Lab Results  Component Value Date   CHOLHDL 2 02/22/2023   CHOLHDL 2 03/12/2022   CHOLHDL 2.9 03/17/2021   Lab Results  Component Value Date   LDLDIRECT 90.0 11/03/2018   LDLDIRECT 111.0 10/29/2017   LDLDIRECT 82.0  08/15/2016   Crestor 10 mg daily from cardiology    Patient Active Problem List   Diagnosis Date Noted   Osteopenia 07/15/2022   Routine general medical examination at a health care facility 03/19/2021   Colon cancer screening 03/17/2021   Congenital coronary artery fistula 02/03/2021   Left leg pain 12/29/2019   Coronary aneurysm 12/29/2019   Essential hypertension 11/10/2018   Prediabetes 11/10/2018   Mitral valve prolapse 11/04/2017   Estrogen deficiency 08/09/2015   Obesity 11/18/2013   Encounter for routine gynecological examination 06/30/2012   Family history of colonic polyps 01/29/2011   Hypothyroidism 05/16/2009   Hyperlipidemia LDL goal <130 05/16/2009   CYSTOCELE WITHOUT MENTION UTERINE PROLAPSE LAT 05/16/2009   Past Medical History:  Diagnosis Date   Arthritis    Constipation    Coronary artery disease    GERD (gastroesophageal reflux disease)    Heart murmur    Hyperlipidemia    Hypertension    Hypothyroid    IBS (irritable bowel syndrome)    Menopausal syndrome    Mitral prolapse and regurge used to have prophylaxis   Obesity    Past Surgical History:  Procedure Laterality Date   BREAST BIOPSY Right 04/18/2022   MM RT BREAST BX W LOC DEV 1ST LESION IMAGE BX SPEC STEREO GUIDE 04/18/2022 [facility]   BREAST EXCISIONAL BIOPSY Right    BREAST SURGERY     breast biopsy x 2 benign   CESAREAN SECTION     COLONOSCOPY WITH PROPOFOL N/A 05/16/2021   Procedure: COLONOSCOPY WITH PROPOFOL;  Surgeon: Midge Minium, MD;  Location: [facility] END[facility]COPY;  Service: Endoscopy;  Laterality: N/A;   COSMETIC SURGERY     Eyelids   EYE SURGERY     Cararacts   retrovaginal fistula repair     Social History   Tobacco Use   Smoking status: Former   Smokeless tobacco: Never  Vaping Use   Vaping status: Never Used  Substance Use Topics   Alcohol use: No   Drug use: No   Family History  Problem Relation Age of Onset   Alcohol abuse Mother    Diabetes Mother     Heart disease Mother        ? MI CAD   Cancer Father        lung CA   Alcohol abuse Father    Hypertension Sister    Hyperlipidemia Sister  Alcohol abuse Brother    Colon polyps Brother    Obesity Maternal Grandfather    Breast cancer Neg Hx    Allergies  Allergen Reactions   Codeine     REACTION: Severe nausea and vomitnig   Current Outpatient Medications on File Prior to Visit  Medication Sig Dispense Refill   aspirin EC 81 MG tablet Take 81 mg by mouth daily. Swallow whole.     calcium-vitamin D (OSCAL WITH D) 500-5 MG-MCG tablet Take 1 tablet by mouth. Pt states this has zinc and mag     metoprolol succinate (TOPROL-XL) 50 MG 24 hr tablet Take 1 tablet (50 mg total) by mouth daily. Take with or immediately following a meal. 90 tablet 2   rosuvastatin (CRESTOR) 10 MG tablet Take 1 tablet (10 mg total) by mouth daily. 90 tablet 3   No current facility-administered medications on file prior to visit.    Review of Systems  Constitutional:  Negative for activity change, appetite change, fatigue, fever and unexpected weight change.  HENT:  Negative for congestion, ear pain, rhinorrhea, sinus pressure and sore throat.   Eyes:  Negative for pain, redness and visual disturbance.  Respiratory:  Negative for cough, shortness of breath and wheezing.   Cardiovascular:  Negative for chest pain and palpitations.  Gastrointestinal:  Negative for abdominal pain, blood in stool, constipation and diarrhea.  Endocrine: Negative for polydipsia and polyuria.  Genitourinary:  Negative for dysuria, frequency and urgency.  Musculoskeletal:  Positive for arthralgias and back pain. Negative for myalgias.  Skin:  Negative for pallor and rash.  Allergic/Immunologic: Negative for environmental allergies.  Neurological:  Negative for dizziness, syncope and headaches.  Hematological:  Negative for adenopathy. Does not bruise/bleed easily.  Psychiatric/Behavioral:  Negative for decreased concentration  and dysphoric mood. The patient is not nervous/anxious.        Objective:   Physical Exam Constitutional:      General: She is not in acute distress.    Appearance: Normal appearance. She is well-developed. She is obese. She is not ill-appearing or diaphoretic.  HENT:     Head: Normocephalic and atraumatic.     Right Ear: Tympanic membrane, ear canal and external ear normal.     Left Ear: Tympanic membrane, ear canal and external ear normal.     Nose: Nose normal. No congestion.     Mouth/Throat:     Mouth: Mucous membranes are moist.     Pharynx: Oropharynx is clear. No posterior oropharyngeal erythema.  Eyes:     General: No scleral icterus.    Extraocular Movements: Extraocular movements intact.     Conjunctiva/sclera: Conjunctivae normal.     Pupils: Pupils are equal, round, and reactive to light.  Neck:     Thyroid: No thyromegaly.     Vascular: No carotid bruit or JVD.  Cardiovascular:     Rate and Rhythm: Normal rate and regular rhythm.     Pulses: Normal pulses.     Heart sounds: Murmur heard.     No gallop.  Pulmonary:     Effort: Pulmonary effort is normal. No respiratory distress.     Breath sounds: Normal breath sounds. No wheezing.     Comments: Good air exch Chest:     Chest wall: No tenderness.  Abdominal:     General: Bowel sounds are normal. There is no distension or abdominal bruit.     Palpations: Abdomen is soft. There is no mass.     Tenderness: There is  no abdominal tenderness.     Hernia: No hernia is present.  Genitourinary:    Comments: Breast exam: No mass, nodules, thickening, tenderness, bulging, retraction, inflamation, nipple discharge or skin changes noted.  No axillary or clavicular LA.     Musculoskeletal:        General: No tenderness. Normal range of motion.     Cervical back: Normal range of motion and neck supple. No rigidity. No muscular tenderness.     Right lower leg: No edema.     Left lower leg: No edema.     Comments: No  kyphosis   Lymphadenopathy:     Cervical: No cervical adenopathy.  Skin:    General: Skin is warm and dry.     Coloration: Skin is not pale.     Findings: No erythema or rash.     Comments: Solar lentigines diffusely Scattered sks   Neurological:     Mental Status: She is alert. Mental status is at baseline.     Cranial Nerves: No cranial nerve deficit.     Motor: No abnormal muscle tone.     Coordination: Coordination normal.     Gait: Gait normal.     Deep Tendon Reflexes: Reflexes are normal and symmetric. Reflexes normal.  Psychiatric:        Mood and Affect: Mood normal.        Cognition and Memory: Cognition and memory normal.           Assessment & Plan:   Problem List Items Addressed This Visit       Cardiovascular and Mediastinum   Essential hypertension - Primary   bp in fair control at this time  BP Readings from Last 1 Encounters:  03/29/23 128/70   No changes needed Most recent labs reviewed  Disc lifstyle change with low sodium diet and exercise  Doing well with  Amlodipine 5 mg in am (did not tolerate 10) Olmesartan 10 mg qhs (did not tolerate 20 or am dosing) Metoprolol xl 50 mg daily  Good health habits Bmet today  Reviewed last cardiology note      Relevant Medications   olmesartan (BENICAR) 20 MG tablet   amLODipine (NORVASC) 5 MG tablet     Endocrine   Hypothyroidism   Hypothyroidism  Pt has no clinical changes No change in energy level/ hair or skin/ edema and no tremor Lab Results  Component Value Date   TSH 0.78 02/22/2023    Plan to continue levothyroxine 150 mcg daily       Relevant Medications   levothyroxine (SYNTHROID) 150 MCG tablet     Musculoskeletal and Integument   Osteopenia   Dexa 06/2022 Discussed fall prevention, supplements and exercise for bone density  No falls or fracture          Other   Routine general medical examination at a health care facility   Reviewed health habits including diet and  exercise and skin cancer prevention Reviewed appropriate screening tests for age  Also reviewed health mt list, fam hx and immunization status , as well as social and family history   See HPI Labs reviewed and ordered Health Maintenance  Topic Date Due   COVID-19 Vaccine (3 - 2024-25 season) 04/14/2023*   Mammogram  03/24/2024   Medicare Annual Wellness Visit  03/28/2024   Colon Cancer Screening  05/17/2031   DTaP/Tdap/Td vaccine (4 - Td or Tdap) 03/21/2032   Pneumonia Vaccine  Completed   Flu Shot  Completed  DEXA scan (bone density measurement)  Completed   Hepatitis C Screening  Completed   Zoster (Shingles) Vaccine  Completed   HPV Vaccine  Aged Out  *Topic was postponed. The date shown is not the original due date.  Discussed fall prevention, supplements and exercise for bone density  PHQ 1 Encouraged strength building exercise and low glycemic diet        Prediabetes   Lab Results  Component Value Date   HGBA1C 5.8 02/22/2023   HGBA1C 5.5 03/12/2022   HGBA1C 5.5 03/17/2021   disc imp of low glycemic diet and wt loss to prevent DM2  Handouts given       Obesity   Discussed how this problem influences overall health and the risks it imposes  Reviewed plan for weight loss with lower calorie diet (via better food choices (lower glycemic and portion control) along with exercise building up to or more than 30 minutes 5 days per week including some aerobic activity and strength training         Hyperlipidemia LDL goal <130   Disc goals for lipids and reasons to control them Rev last labs with pt Rev low sat fat diet in detail Good control with crestor 10 mg daily from cardiology       Relevant Medications   olmesartan (BENICAR) 20 MG tablet   amLODipine (NORVASC) 5 MG tablet   Colon cancer screening   Normal colonoscopy 05/2021 with no recall due to age

## 2023-03-29 NOTE — Assessment & Plan Note (Signed)
 Hypothyroidism  Pt has no clinical changes No change in energy level/ hair or skin/ edema and no tremor Lab Results  Component Value Date   TSH 0.78 02/22/2023    Plan to continue levothyroxine 150 mcg daily

## 2023-03-29 NOTE — Patient Instructions (Addendum)
 Stay as active as you can be  Add some strength training to your routine, this is important for bone and brain health and can reduce your risk of falls and help your body use insulin properly and regulate weight  Light weights, exercise bands , and internet videos are a good way to start  Yoga (chair or regular), machines , floor exercises or a gym with machines are also good options     For prediabetes Try to get most of your carbohydrates from produce (with the exception of white potatoes) and whole grains Eat less bread/pasta/rice/snack foods/cereals/sweets and other items from the middle of the grocery store (processed carbs)  Take care of yourself

## 2023-03-29 NOTE — Assessment & Plan Note (Signed)
 Discussed how this problem influences overall health and the risks it imposes  Reviewed plan for weight loss with lower calorie diet (via better food choices (lower glycemic and portion control) along with exercise building up to or more than 30 minutes 5 days per week including some aerobic activity and strength training

## 2023-03-29 NOTE — Assessment & Plan Note (Signed)
 Lab Results  Component Value Date   HGBA1C 5.8 02/22/2023   HGBA1C 5.5 03/12/2022   HGBA1C 5.5 03/17/2021   disc imp of low glycemic diet and wt loss to prevent DM2  Handouts given

## 2023-03-29 NOTE — Assessment & Plan Note (Signed)
 Disc goals for lipids and reasons to control them Rev last labs with pt Rev low sat fat diet in detail Good control with crestor 10 mg daily from cardiology

## 2023-03-29 NOTE — Patient Instructions (Signed)
 Ms. Sinyard , Thank you for taking time to come for your Medicare Wellness Visit. I appreciate your ongoing commitment to your health goals. Please review the following plan we discussed and let me know if I can assist you in the future.   Referrals/Orders/Follow-Ups/Clinician Recommendations: none  This is a list of the screening recommended for you and due dates:  Health Maintenance  Topic Date Due   COVID-19 Vaccine (3 - 2024-25 season) 09/16/2022   Medicare Annual Wellness Visit  03/19/2023   Mammogram  03/24/2024   Colon Cancer Screening  05/17/2031   DTaP/Tdap/Td vaccine (4 - Td or Tdap) 03/21/2032   Pneumonia Vaccine  Completed   Flu Shot  Completed   DEXA scan (bone density measurement)  Completed   Hepatitis C Screening  Completed   Zoster (Shingles) Vaccine  Completed   HPV Vaccine  Aged Out    Advanced directives: (Declined) Advance directive discussed with you today. Even though you declined this today, please call our office should you change your mind, and we can give you the proper paperwork for you to fill out.  Next Medicare Annual Wellness Visit scheduled for next year: Yes 03/31/24 @8 :10am televisit

## 2023-03-29 NOTE — Assessment & Plan Note (Signed)
 bp in fair control at this time  BP Readings from Last 1 Encounters:  03/29/23 128/70   No changes needed Most recent labs reviewed  Disc lifstyle change with low sodium diet and exercise  Doing well with  Amlodipine 5 mg in am (did not tolerate 10) Olmesartan 10 mg qhs (did not tolerate 20 or am dosing) Metoprolol xl 50 mg daily  Good health habits Bmet today  Reviewed last cardiology note

## 2023-03-29 NOTE — Assessment & Plan Note (Signed)
 Reviewed health habits including diet and exercise and skin cancer prevention Reviewed appropriate screening tests for age  Also reviewed health mt list, fam hx and immunization status , as well as social and family history   See HPI Labs reviewed and ordered Health Maintenance  Topic Date Due   COVID-19 Vaccine (3 - 2024-25 season) 04/14/2023*   Mammogram  03/24/2024   Medicare Annual Wellness Visit  03/28/2024   Colon Cancer Screening  05/17/2031   DTaP/Tdap/Td vaccine (4 - Td or Tdap) 03/21/2032   Pneumonia Vaccine  Completed   Flu Shot  Completed   DEXA scan (bone density measurement)  Completed   Hepatitis C Screening  Completed   Zoster (Shingles) Vaccine  Completed   HPV Vaccine  Aged Out  *Topic was postponed. The date shown is not the original due date.  Discussed fall prevention, supplements and exercise for bone density  PHQ 1 Encouraged strength building exercise and low glycemic diet

## 2023-03-29 NOTE — Assessment & Plan Note (Signed)
 Dexa 06/2022 Discussed fall prevention, supplements and exercise for bone density  No falls or fracture

## 2023-03-29 NOTE — Assessment & Plan Note (Signed)
 Normal colonoscopy 05/2021 with no recall due to age

## 2023-04-22 DIAGNOSIS — M25551 Pain in right hip: Secondary | ICD-10-CM | POA: Diagnosis not present

## 2023-05-20 ENCOUNTER — Telehealth: Payer: Self-pay | Admitting: Cardiology

## 2023-05-20 NOTE — Telephone Encounter (Signed)
   Primary Cardiologist: Dorothye Gathers, MD  Chart reviewed as part of pre-operative protocol coverage. Simple dental extractions and cleanings are considered low risk procedures per guidelines and generally do not require any specific cardiac clearance. It is also generally accepted that for simple extractions and dental cleanings, there is no need to interrupt blood thinner therapy.   SBE prophylaxis is not required for the patient.  I will route this recommendation to the requesting party via Epic fax function and remove from pre-op pool.  Please call with questions.  Gerldine Koch, NP-C 05/20/2023, 10:45 AM [address], [address] [place], [place] [address] Office [phone] Fax [phone]

## 2023-05-20 NOTE — Telephone Encounter (Signed)
 What dental office are you calling from? [facility]   What is your office phone number? [phone]   What is your fax number? [phone]  What type of procedure is the patient having performed? Cleaning    What date is procedure scheduled or is the patient there now? Now  (if the patient is at the dentist's office question goes to their cardiologist if he/she is in the office.  If not, question should go to the DOD).   What is your question (ex. Antibiotics prior to procedure, holding medication-we need to know how long dentist wants pt to hold med)? Pt in chair now. Is Pre medication required

## 2023-07-01 ENCOUNTER — Encounter: Payer: Self-pay | Admitting: Cardiology

## 2023-07-01 MED ORDER — METOPROLOL SUCCINATE ER 50 MG PO TB24: 50.0000 mg | ORAL_TABLET | Freq: Every day | ORAL | 3 refills | Status: AC

## 2023-07-04 ENCOUNTER — Other Ambulatory Visit: Payer: Self-pay

## 2023-07-04 MED ORDER — ROSUVASTATIN CALCIUM 10 MG PO TABS: 10.0000 mg | ORAL_TABLET | Freq: Every day | ORAL | 3 refills | Status: AC

## 2023-07-23 DIAGNOSIS — M25552 Pain in left hip: Secondary | ICD-10-CM | POA: Diagnosis not present

## 2023-07-23 DIAGNOSIS — M25551 Pain in right hip: Secondary | ICD-10-CM | POA: Diagnosis not present

## 2023-08-12 ENCOUNTER — Encounter: Payer: Self-pay | Admitting: Family Medicine

## 2023-08-12 ENCOUNTER — Other Ambulatory Visit: Payer: Self-pay | Admitting: Family Medicine

## 2023-08-12 NOTE — Telephone Encounter (Signed)
 Please advise.  Okay to change Rx to 10 mg to take 1/2 tablet daily?

## 2023-08-13 ENCOUNTER — Encounter: Payer: Self-pay | Admitting: Family Medicine

## 2023-09-27 ENCOUNTER — Ambulatory Visit: Attending: Cardiology | Admitting: Cardiology

## 2023-09-27 ENCOUNTER — Encounter: Payer: Self-pay | Admitting: Cardiology

## 2023-09-27 VITALS — BP 146/87 | HR 65 | Ht 66.05 in | Wt 196.0 lb

## 2023-09-27 DIAGNOSIS — I10 Essential (primary) hypertension: Secondary | ICD-10-CM

## 2023-09-27 DIAGNOSIS — Q245 Malformation of coronary vessels: Secondary | ICD-10-CM

## 2023-09-27 DIAGNOSIS — I2541 Coronary artery aneurysm: Secondary | ICD-10-CM | POA: Diagnosis not present

## 2023-09-27 MED ORDER — OLMESARTAN MEDOXOMIL 5 MG PO TABS: 5.0000 mg | ORAL_TABLET | Freq: Every day | ORAL | 3 refills | Status: AC

## 2023-09-27 NOTE — Progress Notes (Signed)
 Cardiology Office Note:  .   Date:  09/27/2023  ID:  Robin Gilmore, DOB 12-17-1950, MRN [id number] PCP: Randeen Laine LABOR, MD  [facility] Providers Cardiologist:  Oneil Parchment, MD     History of Present Illness: .   Robin Gilmore is a 73 y.o. female Discussed the use of AI scribe software for clinical note transcription with the patient, who gave verbal consent to proceed.  History of Present Illness Robin Gilmore is a 73 year old female with coronary artery aneurysms, coronary fistula, mitral valve prolapse, hypertension, and hyperlipidemia who presents for follow-up.  She has a history of coronary artery aneurysms and coronary fistulas. A coronary CT scan on June 15, 2021, showed two saccular aneurysms of the proximal left circumflex artery measuring 17x18 mm and 21x16 mm, with fistulas connecting to the right pulmonary artery and right coronary artery to left coronary fistula. The imaging was stable with no significant extravasation of contrast into the right pulmonary artery.  She has a history of mitral valve prolapse, which was previously seen on echocardiogram and remains stable. An echocardiogram in 2021 showed a normal ejection fraction.  She has hyperlipidemia and is currently taking Crestor  10 mg daily.  She also has hypertension, which is managed with amlodipine  5 mg and olmesartan  10 mg daily. She has a history of ankle swelling when her medication dosage was higher, and she reported adjusting the dose herself. She monitors her blood pressure at home, which is generally low on the current regimen, although she experiences 'white coat syndrome' during doctor visits.     Studies Reviewed: SABRA   EKG Interpretation Date/Time:  Friday September 27 2023 09:23:19 EDT Ventricular Rate:  65 PR Interval:  194 QRS Duration:  88 QT Interval:  396 QTC Calculation: 411 R Axis:   58  Text Interpretation: Normal sinus rhythm Normal ECG When compared with ECG of  31-Aug-2022 12:58, No significant change since last tracing Inferior leads Confirmed by Parchment Oneil ([id number]) on 09/27/2023 10:02:48 AM    Results RADIOLOGY Coronary CT: Two saccular aneurysms of the proximal left circumflex, 17x18 mm and 21x16 mm, with fistulous connection to the right pulmonary artery, right coronary artery to left coronary fistula, tortuous with no significant extravasation of contrast into the right pulmonary artery (06/15/2021)  DIAGNOSTIC Echocardiogram: Normal ejection fraction (2021) Risk Assessment/Calculations:           Physical Exam:   VS:  BP (!) 146/87   Pulse 65   Ht 5' 6.05 (1.678 m)   Wt 196 lb (88.9 kg)   SpO2 94%   BMI 31.59 kg/m    Wt Readings from Last 3 Encounters:  09/27/23 196 lb (88.9 kg)  03/29/23 197 lb (89.4 kg)  03/29/23 194 lb (88 kg)    GEN: Well nourished, well developed in no acute distress NECK: No JVD; No carotid bruits CARDIAC: RRR, no murmurs, no rubs, no gallops RESPIRATORY:  Clear to auscultation without rales, wheezing or rhonchi  ABDOMEN: Soft, non-tender, non-distended EXTREMITIES:  No edema; No deformity   ASSESSMENT AND PLAN: .    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 Previous coronary CT scan on June 15, 2021, showed stable aneurysms with no significant extravasation of contrast. Medical management is being pursued due to the low pressure system of coronary arteries, reducing the  risk of complications. The decision to monitor rather than intervene surgically is based on the consensus that surgical intervention poses more risk than the current condition. - Order coronary CT scan every two years to monitor coronary fistula and circumflex aneurysms unless changes are noted.  Mitral valve prolapse Mitral valve prolapse  previously seen on echocardiogram with no new symptoms or changes reported.  Essential hypertension Hypertension managed with amlodipine  5 mg and olmesartan  5 mg daily. Ankle swelling occurred with higher doses of olmesartan , and the dose was adjusted to 5 mg, effectively maintaining blood pressure within normal limits at home. Experiences white coat syndrome, leading to elevated readings in the clinical setting.  Hyperlipidemia Hyperlipidemia managed with Crestor  10 mg daily. LDL levels are controlled with the current medication regimen.         Dispo: 1 yr  Signed, Oneil Parchment, MD

## 2023-09-27 NOTE — Patient Instructions (Signed)
 Medication Instructions:  The current medical regimen is effective;  continue present plan and medications. (Olmesartan  5 mg daily sent into pharmacy)  *If you need a refill on your cardiac medications before your next appointment, please call your pharmacy*  Testing/Procedures:   Your cardiac CT will be scheduled at:   [facility] [address]  [place], [place] [address] [phone]   If scheduled at the Heart [facility] V[facility] at [facility] street, please enter the parking lot using the [address] entrance and use the FREE valet service at the patient drop-off area. Enter the building and check-in with registration on the main floor.  Please follow these instructions carefully (unless otherwise directed):  An IV will be required for this test and Nitroglycerin  will be given.   On the Night Before the Test: Be sure to Drink plenty of water. Do not consume any caffeinated/decaffeinated beverages or chocolate 12 hours prior to your test. Do not take any antihistamines 12 hours prior to your test.  On the Day of the Test: Drink plenty of water until 1 hour prior to the test. Do not eat any food 1 hour prior to test. You may take your regular medications prior to the test.  Take metoprolol  (Lopressor ) two hours prior to test. If you take Furosemide/Hydrochlorothiazide/Spironolactone/Chlorthalidone, please HOLD on the morning of the test. Patients who wear a continuous glucose monitor MUST remove the device prior to scanning. FEMALES- please wear underwire-free bra if available, avoid dresses & tight clothing      After the Test: Drink plenty of water. After receiving IV contrast, you may experience a mild flushed feeling. This is normal. On occasion, you may experience a mild rash up to 24 hours after the test. This is not dangerous. If this occurs, you can take Benadryl 25 mg, Zyrtec, Claritin, or Allegra and increase your fluid intake.  (Patients taking Tikosyn should avoid Benadryl, and may take Zyrtec, Claritin, or Allegra) If you experience trouble breathing, this can be serious. If it is severe call 911 IMMEDIATELY. If it is mild, please call our office.  We will call to schedule your test 2-4 weeks out understanding that some insurance companies will need an authorization prior to the service being performed.   For more information and frequently asked questions, please visit our website : [email or web address]  For non-scheduling related questions, please contact the cardiac imaging nurse navigator should you have any questions/concerns: Cardiac Imaging Nurse Navigators Direct Office Dial: [phone]   For scheduling needs, including cancellations and rescheduling, please call [place], [phone].   Follow-Up: At [facility], you and your health needs are our priority.  As part of our continuing mission to provide you with exceptional heart care, our providers are all part of one team.  This team includes your primary Cardiologist (physician) and Advanced Practice Providers or APPs (Physician Assistants and Nurse Practitioners) who all work together to provide you with the care you need, when you need it.  Your next appointment:   1 year(s)  Provider:   Oneil Parchment, MD    We recommend signing up for the patient portal called MyChart.  Sign up information is provided on this After Visit Summary.  MyChart is used to connect with patients for Virtual Visits (Telemedicine).  Patients are able to view lab/test results, encounter notes, upcoming appointments, etc.  Non-urgent messages can be sent to your provider as well.   To learn more about what you can do with  MyChart, go to [email or web address].

## 2023-09-30 ENCOUNTER — Encounter: Payer: Self-pay | Admitting: Family Medicine

## 2023-10-01 ENCOUNTER — Encounter (HOSPITAL_COMMUNITY): Payer: Self-pay

## 2023-10-04 ENCOUNTER — Ambulatory Visit (HOSPITAL_COMMUNITY)
Admission: RE | Admit: 2023-10-04 | Discharge: 2023-10-04 | Disposition: A | Discharge status: Home / Self Care | Source: Ambulatory Visit | Attending: Cardiology | Admitting: Cardiology

## 2023-10-04 DIAGNOSIS — I2510 Atherosclerotic heart disease of native coronary artery without angina pectoris: Secondary | ICD-10-CM | POA: Insufficient documentation

## 2023-10-04 DIAGNOSIS — I2541 Coronary artery aneurysm: Secondary | ICD-10-CM | POA: Insufficient documentation

## 2023-10-04 DIAGNOSIS — I517 Cardiomegaly: Secondary | ICD-10-CM | POA: Diagnosis not present

## 2023-10-04 DIAGNOSIS — Q245 Malformation of coronary vessels: Secondary | ICD-10-CM

## 2023-10-04 MED ORDER — IOHEXOL 350 MG/ML SOLN
100.0000 mL | Freq: Once | INTRAVENOUS | Status: AC | PRN
Start: 2023-10-04 — End: 2023-10-04
  Administered 2023-10-04: 100 mL via INTRAVENOUS

## 2023-10-04 MED ORDER — NITROGLYCERIN 0.4 MG SL SUBL
0.8000 mg | SUBLINGUAL_TABLET | Freq: Once | SUBLINGUAL | Status: AC
  Administered 2023-10-04: 0.8 mg via SUBLINGUAL

## 2023-10-09 ENCOUNTER — Ambulatory Visit: Payer: Self-pay | Admitting: Cardiology

## 2023-10-28 DIAGNOSIS — M25551 Pain in right hip: Secondary | ICD-10-CM | POA: Diagnosis not present

## 2023-11-15 ENCOUNTER — Other Ambulatory Visit: Payer: Self-pay | Admitting: *Deleted

## 2023-11-15 DIAGNOSIS — I2541 Coronary artery aneurysm: Secondary | ICD-10-CM

## 2023-12-25 ENCOUNTER — Encounter: Payer: Self-pay | Admitting: Family Medicine

## 2024-01-17 ENCOUNTER — Other Ambulatory Visit: Payer: Self-pay | Admitting: *Deleted

## 2024-01-17 MED ORDER — AMLODIPINE BESYLATE 5 MG PO TABS: 5.0000 mg | ORAL_TABLET | Freq: Every day | ORAL | 0 refills | Status: AC

## 2024-03-31 ENCOUNTER — Ambulatory Visit
